# Patient Record
Sex: Male | Born: 1935 | Race: Black or African American | Hispanic: No | Marital: Married | Smoking: Never smoker
Health system: Southern US, Community
[De-identification: ages and names within clinical notes are randomized; demographics above are authoritative.]

## PROBLEM LIST (undated history)

## (undated) DIAGNOSIS — D649 Anemia, unspecified: Secondary | ICD-10-CM

## (undated) DIAGNOSIS — G4733 Obstructive sleep apnea (adult) (pediatric): Secondary | ICD-10-CM

## (undated) DIAGNOSIS — K5730 Diverticulosis of large intestine without perforation or abscess without bleeding: Secondary | ICD-10-CM

## (undated) DIAGNOSIS — M545 Low back pain, unspecified: Secondary | ICD-10-CM

## (undated) DIAGNOSIS — Z9989 Dependence on other enabling machines and devices: Secondary | ICD-10-CM

## (undated) DIAGNOSIS — K219 Gastro-esophageal reflux disease without esophagitis: Secondary | ICD-10-CM

## (undated) DIAGNOSIS — I824Z2 Acute embolism and thrombosis of unspecified deep veins of left distal lower extremity: Secondary | ICD-10-CM

## (undated) DIAGNOSIS — M1990 Unspecified osteoarthritis, unspecified site: Secondary | ICD-10-CM

## (undated) DIAGNOSIS — I82409 Acute embolism and thrombosis of unspecified deep veins of unspecified lower extremity: Secondary | ICD-10-CM

## (undated) DIAGNOSIS — E039 Hypothyroidism, unspecified: Secondary | ICD-10-CM

## (undated) DIAGNOSIS — R918 Other nonspecific abnormal finding of lung field: Secondary | ICD-10-CM

## (undated) DIAGNOSIS — K5909 Other constipation: Secondary | ICD-10-CM

## (undated) DIAGNOSIS — I10 Essential (primary) hypertension: Secondary | ICD-10-CM

## (undated) HISTORY — PX: CATARACT EXTRACTION, BILATERAL: SHX1313

## (undated) HISTORY — DX: Other constipation: K59.09

## (undated) HISTORY — DX: Gastro-esophageal reflux disease without esophagitis: K21.9

## (undated) HISTORY — DX: Obstructive sleep apnea (adult) (pediatric): G47.33

## (undated) HISTORY — DX: Acute embolism and thrombosis of unspecified deep veins of unspecified lower extremity: I82.409

## (undated) HISTORY — DX: Other nonspecific abnormal finding of lung field: R91.8

## (undated) HISTORY — DX: Essential (primary) hypertension: I10

## (undated) HISTORY — DX: Unspecified osteoarthritis, unspecified site: M19.90

## (undated) HISTORY — DX: Hypothyroidism, unspecified: E03.9

## (undated) HISTORY — DX: Acute embolism and thrombosis of unspecified deep veins of left distal lower extremity: I82.4Z2

## (undated) HISTORY — DX: Low back pain, unspecified: M54.50

## (undated) HISTORY — DX: Dependence on other enabling machines and devices: Z99.89

## (undated) HISTORY — DX: Low back pain: M54.5

## (undated) HISTORY — PX: TOTAL KNEE ARTHROPLASTY: SHX125

## (undated) HISTORY — DX: Anemia, unspecified: D64.9

## (undated) HISTORY — DX: Diverticulosis of large intestine without perforation or abscess without bleeding: K57.30

---

## 1997-08-12 ENCOUNTER — Ambulatory Visit: Admission: RE | Admit: 1997-08-12 | Discharge: 1997-08-12 | Payer: Self-pay | Admitting: Otolaryngology

## 2000-07-10 ENCOUNTER — Ambulatory Visit (HOSPITAL_COMMUNITY): Admission: RE | Admit: 2000-07-10 | Discharge: 2000-07-10 | Payer: Self-pay | Admitting: Pulmonary Disease

## 2000-07-10 ENCOUNTER — Encounter: Payer: Self-pay | Admitting: Pulmonary Disease

## 2004-01-21 DIAGNOSIS — R918 Other nonspecific abnormal finding of lung field: Secondary | ICD-10-CM

## 2004-01-21 HISTORY — DX: Other nonspecific abnormal finding of lung field: R91.8

## 2004-09-06 ENCOUNTER — Ambulatory Visit: Payer: Self-pay | Admitting: Pulmonary Disease

## 2004-09-13 ENCOUNTER — Ambulatory Visit: Payer: Self-pay | Admitting: Pulmonary Disease

## 2004-09-13 ENCOUNTER — Ambulatory Visit: Payer: Self-pay | Admitting: Gastroenterology

## 2004-09-16 ENCOUNTER — Ambulatory Visit: Payer: Self-pay | Admitting: Pulmonary Disease

## 2004-10-02 ENCOUNTER — Ambulatory Visit: Payer: Self-pay | Admitting: Gastroenterology

## 2004-10-08 ENCOUNTER — Ambulatory Visit: Payer: Self-pay | Admitting: Pulmonary Disease

## 2004-10-09 ENCOUNTER — Encounter: Admission: RE | Admit: 2004-10-09 | Discharge: 2004-10-09 | Payer: Self-pay | Admitting: Orthopedic Surgery

## 2004-10-10 ENCOUNTER — Inpatient Hospital Stay (HOSPITAL_COMMUNITY): Admission: RE | Admit: 2004-10-10 | Discharge: 2004-10-14 | Payer: Self-pay | Admitting: Orthopedic Surgery

## 2004-12-20 ENCOUNTER — Ambulatory Visit (HOSPITAL_COMMUNITY): Admission: RE | Admit: 2004-12-20 | Discharge: 2004-12-20 | Payer: Self-pay | Admitting: Ophthalmology

## 2005-01-07 ENCOUNTER — Ambulatory Visit: Payer: Self-pay | Admitting: Pulmonary Disease

## 2005-03-10 ENCOUNTER — Ambulatory Visit: Payer: Self-pay | Admitting: Pulmonary Disease

## 2005-03-27 ENCOUNTER — Ambulatory Visit (HOSPITAL_COMMUNITY): Admission: RE | Admit: 2005-03-27 | Discharge: 2005-03-27 | Payer: Self-pay | Admitting: Ophthalmology

## 2005-07-08 ENCOUNTER — Ambulatory Visit: Payer: Self-pay | Admitting: Pulmonary Disease

## 2005-09-18 ENCOUNTER — Ambulatory Visit: Payer: Self-pay | Admitting: Pulmonary Disease

## 2005-09-25 ENCOUNTER — Ambulatory Visit (HOSPITAL_COMMUNITY): Admission: RE | Admit: 2005-09-25 | Discharge: 2005-09-25 | Payer: Self-pay | Admitting: Orthopedic Surgery

## 2005-09-30 ENCOUNTER — Ambulatory Visit: Payer: Self-pay | Admitting: Pulmonary Disease

## 2005-10-03 ENCOUNTER — Ambulatory Visit: Payer: Self-pay | Admitting: Pulmonary Disease

## 2005-10-03 ENCOUNTER — Ambulatory Visit (HOSPITAL_COMMUNITY): Admission: RE | Admit: 2005-10-03 | Discharge: 2005-10-03 | Payer: Self-pay | Admitting: Orthopedic Surgery

## 2005-10-09 ENCOUNTER — Encounter: Admission: RE | Admit: 2005-10-09 | Discharge: 2005-10-09 | Payer: Self-pay | Admitting: Orthopedic Surgery

## 2005-11-19 ENCOUNTER — Inpatient Hospital Stay (HOSPITAL_COMMUNITY): Admission: RE | Admit: 2005-11-19 | Discharge: 2005-11-23 | Payer: Self-pay | Admitting: Orthopedic Surgery

## 2006-01-07 ENCOUNTER — Ambulatory Visit: Payer: Self-pay | Admitting: Pulmonary Disease

## 2006-02-23 ENCOUNTER — Ambulatory Visit (HOSPITAL_COMMUNITY): Admission: RE | Admit: 2006-02-23 | Discharge: 2006-02-23 | Payer: Self-pay | Admitting: Ophthalmology

## 2006-05-13 ENCOUNTER — Ambulatory Visit: Payer: Self-pay | Admitting: Pulmonary Disease

## 2006-05-15 ENCOUNTER — Ambulatory Visit: Payer: Self-pay | Admitting: Pulmonary Disease

## 2006-05-15 LAB — CONVERTED CEMR LAB
ALT: 23 units/L (ref 0–40)
Albumin: 3.5 g/dL (ref 3.5–5.2)
Alkaline Phosphatase: 54 units/L (ref 39–117)
BUN: 16 mg/dL (ref 6–23)
Basophils Absolute: 0 10*3/uL (ref 0.0–0.1)
Basophils Relative: 0.8 % (ref 0.0–1.0)
Bilirubin, Direct: 0.1 mg/dL (ref 0.0–0.3)
Calcium: 8.9 mg/dL (ref 8.4–10.5)
Chloride: 109 meq/L (ref 96–112)
Eosinophils Absolute: 0.1 10*3/uL (ref 0.0–0.6)
GFR calc Af Amer: 84 mL/min
GFR calc non Af Amer: 70 mL/min
HCT: 34.6 % — ABNORMAL LOW (ref 39.0–52.0)
HDL: 44.4 mg/dL (ref >39.0)
Hemoglobin: 11.8 g/dL — ABNORMAL LOW (ref 13.0–17.0)
MCV: 90.7 fL (ref 78.0–100.0)
PSA: 1.19 ng/mL (ref 0.10–4.00)
Platelets: 230 10*3/uL (ref 150–400)
RBC: 3.82 M/uL — ABNORMAL LOW (ref 4.22–5.81)
Total Bilirubin: 1 mg/dL (ref 0.3–1.2)
Triglycerides: 63 mg/dL (ref 0–149)

## 2007-04-26 DIAGNOSIS — F411 Generalized anxiety disorder: Secondary | ICD-10-CM | POA: Insufficient documentation

## 2007-04-26 DIAGNOSIS — K219 Gastro-esophageal reflux disease without esophagitis: Secondary | ICD-10-CM

## 2007-04-26 DIAGNOSIS — Z8672 Personal history of thrombophlebitis: Secondary | ICD-10-CM

## 2007-04-26 DIAGNOSIS — F528 Other sexual dysfunction not due to a substance or known physiological condition: Secondary | ICD-10-CM | POA: Insufficient documentation

## 2007-04-26 DIAGNOSIS — M1990 Unspecified osteoarthritis, unspecified site: Secondary | ICD-10-CM | POA: Insufficient documentation

## 2007-04-26 DIAGNOSIS — R918 Other nonspecific abnormal finding of lung field: Secondary | ICD-10-CM

## 2007-04-26 DIAGNOSIS — I10 Essential (primary) hypertension: Secondary | ICD-10-CM

## 2007-04-26 DIAGNOSIS — M545 Low back pain: Secondary | ICD-10-CM

## 2007-06-04 ENCOUNTER — Ambulatory Visit: Payer: Self-pay | Admitting: Pulmonary Disease

## 2007-06-23 ENCOUNTER — Encounter: Admission: RE | Admit: 2007-06-23 | Discharge: 2007-06-23 | Payer: Self-pay | Admitting: Orthopedic Surgery

## 2007-09-03 ENCOUNTER — Ambulatory Visit (HOSPITAL_COMMUNITY): Admission: RE | Admit: 2007-09-03 | Discharge: 2007-09-03 | Payer: Self-pay | Admitting: Ophthalmology

## 2007-09-10 ENCOUNTER — Ambulatory Visit: Payer: Self-pay | Admitting: Pulmonary Disease

## 2007-09-10 DIAGNOSIS — E7800 Pure hypercholesterolemia, unspecified: Secondary | ICD-10-CM | POA: Insufficient documentation

## 2007-09-10 DIAGNOSIS — K429 Umbilical hernia without obstruction or gangrene: Secondary | ICD-10-CM | POA: Insufficient documentation

## 2007-09-10 DIAGNOSIS — D649 Anemia, unspecified: Secondary | ICD-10-CM

## 2007-09-10 DIAGNOSIS — K5730 Diverticulosis of large intestine without perforation or abscess without bleeding: Secondary | ICD-10-CM | POA: Insufficient documentation

## 2007-10-20 ENCOUNTER — Encounter: Payer: Self-pay | Admitting: Pulmonary Disease

## 2008-02-21 ENCOUNTER — Ambulatory Visit: Payer: Self-pay | Admitting: Pulmonary Disease

## 2008-02-22 ENCOUNTER — Encounter: Payer: Self-pay | Admitting: Adult Health

## 2008-02-23 LAB — CONVERTED CEMR LAB
Hemoglobin, Urine: NEGATIVE
Mucus, UA: NEGATIVE
PSA: 1.62 ng/mL (ref 0.10–4.00)
Urine Glucose: NEGATIVE mg/dL

## 2008-06-15 ENCOUNTER — Ambulatory Visit: Payer: Self-pay | Admitting: Pulmonary Disease

## 2008-06-15 DIAGNOSIS — G4733 Obstructive sleep apnea (adult) (pediatric): Secondary | ICD-10-CM

## 2008-06-15 DIAGNOSIS — Z9989 Dependence on other enabling machines and devices: Secondary | ICD-10-CM

## 2008-06-17 ENCOUNTER — Ambulatory Visit (HOSPITAL_BASED_OUTPATIENT_CLINIC_OR_DEPARTMENT_OTHER): Admission: RE | Admit: 2008-06-17 | Discharge: 2008-06-17 | Payer: Self-pay | Admitting: Pulmonary Disease

## 2008-06-17 ENCOUNTER — Encounter: Payer: Self-pay | Admitting: Pulmonary Disease

## 2008-06-30 ENCOUNTER — Ambulatory Visit: Payer: Self-pay | Admitting: Pulmonary Disease

## 2008-06-30 ENCOUNTER — Telehealth (INDEPENDENT_AMBULATORY_CARE_PROVIDER_SITE_OTHER): Payer: Self-pay | Admitting: *Deleted

## 2008-08-16 ENCOUNTER — Ambulatory Visit: Payer: Self-pay | Admitting: Pulmonary Disease

## 2008-09-07 ENCOUNTER — Encounter: Payer: Self-pay | Admitting: Pulmonary Disease

## 2008-09-21 ENCOUNTER — Ambulatory Visit: Payer: Self-pay | Admitting: Pulmonary Disease

## 2008-10-14 ENCOUNTER — Encounter: Payer: Self-pay | Admitting: Pulmonary Disease

## 2008-11-23 ENCOUNTER — Telehealth (INDEPENDENT_AMBULATORY_CARE_PROVIDER_SITE_OTHER): Payer: Self-pay | Admitting: *Deleted

## 2008-12-06 ENCOUNTER — Ambulatory Visit: Payer: Self-pay | Admitting: Pulmonary Disease

## 2008-12-06 ENCOUNTER — Ambulatory Visit: Payer: Self-pay

## 2008-12-06 ENCOUNTER — Encounter: Payer: Self-pay | Admitting: Pulmonary Disease

## 2008-12-06 DIAGNOSIS — I872 Venous insufficiency (chronic) (peripheral): Secondary | ICD-10-CM | POA: Insufficient documentation

## 2008-12-07 ENCOUNTER — Telehealth: Payer: Self-pay | Admitting: Pulmonary Disease

## 2008-12-08 ENCOUNTER — Telehealth: Payer: Self-pay | Admitting: Pulmonary Disease

## 2009-03-22 ENCOUNTER — Ambulatory Visit: Payer: Self-pay | Admitting: Pulmonary Disease

## 2009-04-23 ENCOUNTER — Encounter: Payer: Self-pay | Admitting: Pulmonary Disease

## 2009-10-15 ENCOUNTER — Encounter: Payer: Self-pay | Admitting: Pulmonary Disease

## 2009-10-15 ENCOUNTER — Telehealth: Payer: Self-pay | Admitting: Pulmonary Disease

## 2009-12-24 ENCOUNTER — Telehealth: Payer: Self-pay | Admitting: Pulmonary Disease

## 2010-01-17 ENCOUNTER — Ambulatory Visit (HOSPITAL_COMMUNITY)
Admission: RE | Admit: 2010-01-17 | Discharge: 2010-01-17 | Payer: Self-pay | Source: Home / Self Care | Attending: Ophthalmology | Admitting: Ophthalmology

## 2010-01-20 HISTORY — PX: COLONOSCOPY: SHX174

## 2010-02-09 ENCOUNTER — Encounter: Payer: Self-pay | Admitting: Orthopedic Surgery

## 2010-02-17 LAB — CONVERTED CEMR LAB
ALT: 26 units/L (ref 0–53)
AST: 29 units/L (ref 0–37)
Albumin: 4 g/dL (ref 3.5–5.2)
Alkaline Phosphatase: 48 units/L (ref 39–117)
BUN: 17 mg/dL (ref 6–23)
Basophils Relative: 0.7 % (ref 0.0–3.0)
CO2: 30 meq/L (ref 19–32)
CO2: 31 meq/L (ref 19–32)
Calcium: 9.2 mg/dL (ref 8.4–10.5)
Calcium: 9.2 mg/dL (ref 8.4–10.5)
Chloride: 105 meq/L (ref 96–112)
Chloride: 106 meq/L (ref 96–112)
Cholesterol: 168 mg/dL (ref 0–200)
Cholesterol: 194 mg/dL (ref 0–200)
Eosinophils Relative: 1.8 % (ref 0.0–5.0)
Glucose, Bld: 90 mg/dL (ref 70–99)
HCT: 38.9 % — ABNORMAL LOW (ref 39.0–52.0)
HDL: 45 mg/dL (ref >39.00)
Hemoglobin: 13.1 g/dL (ref 13.0–17.0)
Hemoglobin: 13.6 g/dL (ref 13.0–17.0)
Ketones, ur: NEGATIVE mg/dL
Lymphocytes Relative: 43.4 % (ref 12.0–46.0)
MCHC: 33.8 g/dL (ref 30.0–36.0)
MCHC: 34.4 g/dL (ref 30.0–36.0)
MCV: 94.7 fL (ref 78.0–100.0)
MCV: 96.1 fL (ref 78.0–100.0)
Monocytes Absolute: 0.7 10*3/uL (ref 0.1–1.0)
Monocytes Absolute: 0.7 10*3/uL (ref 0.1–1.0)
Monocytes Relative: 11.4 % (ref 3.0–12.0)
Monocytes Relative: 12.4 % — ABNORMAL HIGH (ref 3.0–12.0)
Neutro Abs: 2.3 10*3/uL (ref 1.4–7.7)
Neutrophils Relative %: 41.9 % — ABNORMAL LOW (ref 43.0–77.0)
PSA: 1.38 ng/mL (ref 0.10–4.00)
Potassium: 4.1 meq/L (ref 3.5–5.1)
RBC: 4.05 M/uL — ABNORMAL LOW (ref 4.22–5.81)
TSH: 3.79 microintl units/mL (ref 0.35–5.50)
Total Bilirubin: 1 mg/dL (ref 0.3–1.2)
Total CHOL/HDL Ratio: 4
Total Protein: 7.3 g/dL (ref 6.0–8.3)
Total Protein: 7.4 g/dL (ref 6.0–8.3)
Urine Glucose: NEGATIVE mg/dL
VLDL: 10.6 mg/dL (ref 0.0–40.0)
WBC: 5.4 10*3/uL (ref 4.5–10.5)
WBC: 5.9 10*3/uL (ref 4.5–10.5)
pH: 6.5 (ref 5.0–8.0)

## 2010-02-19 NOTE — Progress Notes (Signed)
Summary: LETTER-lmtcb x 1  Phone Note Call from Patient Call back at [phone]   Caller: Patient Call For: NADEL Summary of Call: NEED LETTER WRITTEN THAT HE IS UNABLE TO WORK 10 TO 16 HOURS A DAY STANDING ON HIS FEET Initial call taken by: Gustavus Bryant,  October 15, 2009 12:01 PM  Follow-up for Phone Call        [facility] Tilden Dome  October 15, 2009 1:44 PM  called and spoke with pt.  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  Currently at his job he is able to take a break/rest/sit after every 2 hours of standing. However, pt states his job is changing their rules and now are requiring employees to have to stand at one time anywhere from 10 to 16 hours without a break.  Pt states he has had both knees replaced and this would make it very difficult for him to do his job.  Pt would like SN to type up a letter on letterhead stating that pt needs a break every 2 hours for medical reasons and to work no more than 10 hours at a time.  Pt would like this letter mailed to his home address.  Will forward message to SN to address.  Jinny Blossom Reynolds LPN  October 16, 3530 2:07 PM    Additional Follow-up for Phone Call Additional follow up Details #1::        letter done... SN

## 2010-02-19 NOTE — Letter (Signed)
Summary: External Correspondence  External Correspondence   Imported By: Adin Hector 04/23/2009 16:09:56  _____________________________________________________________________  External Attachment:    Type:   Image     Comment:   External Document

## 2010-02-19 NOTE — Progress Notes (Signed)
Summary: ? why Furosemide denied  Phone Note Call from Patient Call back at [phone]   Caller: Patient Call For: Lenna Gilford Reason for Call: Talk to Nurse Summary of Call: Pt requesting refill on Furosemide 20m, and was advised by [facility] same has been denied. JIran PlanasCMA  December 24, 2009 10:16 AM   Follow-up for Phone Call        pt set an appt with Dr. NLenna Gilfordon 02-13-10 at 11am. refill sent to last until appt. JRandolph BingCMA  December 24, 2009 10:46 AM     Prescriptions: FUROSEMIDE 40 MG TABS (FUROSEMIDE) take 1 tab by mouth once daily in the AM...  #30 x 2   Entered by:   JRandolph BingCMA   Authorized by:   SNoralee SpaceMD   Signed by:   JRandolph BingCMA on 12/24/2009   Method used:   Electronically to        [id number]       [address] [place]  [address]      Ph: [phone]      Fax: [phone]  RxID:   [id number]

## 2010-02-19 NOTE — Letter (Signed)
Summary: [facility]  [address], [place] [address]   Phone: [phone]  Fax: [phone]    10/15/2009  Justin Hester [address] [place], [place]  [address]  To Whom It May Concern,    Mr. Justin Hester is a 77 y/o patient of mine followed for general medical purposes.  He has multiple medical issues including severe Osteoarthritis and he has had bilateral total knee replacements.    It has come to my attention that recent changes at work require him to stand for up to 10+ hours at a stretch, and I feel that this would be impossible for a gentleman with this degree of arthritis.     This letter is a request to allow Justin Hester to have breaks every 2 hours for medical reasons. Furthermore I do not believe that he should work more than a 10 hour day. 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  Sincerely,   Deborra Medina. Lenna Gilford MD

## 2010-02-19 NOTE — Assessment & Plan Note (Signed)
Summary: rov for osa   Copy to:  Self- Referral  Primary Provider/Referring Provider:  Teressa Lower  CC:  Pt is here for a 6 month f/u appt.  Pt states he is wearing his cpap machine every night.  Approx 6 hours per night.  Pt denied any complaints with mask or pressure. Marland Kitchen  History of Present Illness: the pt comes in today for f/u of his osa.  He is wearing cpap compliantly and reports no issues with his mask fit or pressure tolerance.  He feels he is sleeping well, and has an acceptable daytime alertness.  He still needs to work on getting more sleep.  Current Medications (verified): 1)  Adult Aspirin Low Strength 81 Mg  Tbdp (Aspirin) .... Take 1 Tablet By Mouth Once A Day 2)  Amlodipine Besylate 10 Mg Tabs (Amlodipine Besylate) .... Take 1 Tab By Mouth Once Daily.Marland KitchenMarland Kitchen 3)  Losartan Potassium 100 Mg Tabs (Losartan Potassium) .... Take 1 Tab By Mouth Once Daily.Marland KitchenMarland Kitchen 4)  Furosemide 40 Mg Tabs (Furosemide) .... Take 1 Tab By Mouth Once Daily in The Am... 5)  Naproxen 250 Mg Tabs (Naproxen) .... As Needed 6)  Tylenol Extra Strength 500 Mg  Tabs (Acetaminophen) .... As Needed 7)  Multivitamins   Tabs (Multiple Vitamin) .... Take 1 Tablet By Mouth Once A Day  Allergies (verified): 1)  ! Darvocet 2)  ! * Cyclobenzaprine 3)  Percocet (Oxycodone-Acetaminophen)  Review of Systems      See HPI  Vital Signs:  Patient profile:   77 year old male Height:      73 inches Weight:      233.13 pounds BMI:     30.87 O2 Sat:      93 % on Room air Temp:     98.2 degrees F oral Pulse rate:   84 / minute BP sitting:   136 / 72  (left arm) Cuff size:   regular  Vitals Entered By: Matthew Folks LPN (March 22, 3472 2:59 PM)  O2 Flow:  Room air CC: Pt is here for a 6 month f/u appt.  Pt states he is wearing his cpap machine every night.  Approx 6 hours per night.  Pt denied any complaints with mask or pressure.  Comments Medications reviewed with patient Matthew Folks LPN  March 22, 5636 7:56 PM     Physical Exam  General:  ow male in nad Nose:  no skin breakdown or pressure necrosis from cpap mask Neurologic:  alert, not sleepy, moves all 4.   Impression & Recommendations:  Problem # 1:  OBSTRUCTIVE SLEEP APNEA (ICD-327.23) the pt is wearing cpap compliantly and reports no issues with the device.  I have asked him to continue with treatment, and reminded him that he can discontinue if he is able to lose significant weight.  He will work on this.  Medications Added to Medication List This Visit: 1)  Naproxen 250 Mg Tabs (Naproxen) .... As needed  Other Orders: Est. Patient Level II (43329)  Patient Instructions: 1)  work on getting more sleep 2)  continue with cpap 3)  work on weight loss 4)  followup with me in one year.

## 2010-02-21 ENCOUNTER — Telehealth (INDEPENDENT_AMBULATORY_CARE_PROVIDER_SITE_OTHER): Payer: Self-pay | Admitting: *Deleted

## 2010-02-27 NOTE — Progress Notes (Signed)
Summary: pt is having stomach problems  Phone Note Call from Patient   Caller: Patient Call For: nadel Summary of Call: patient phoned stated that he has some questions regarding his health. He is having some stomach problems and would like to speak to the nurse. He knows of a clinic that is available when he is available and they take walk in and he is interested in a referral. Patient can be reached at [phone] Initial call taken by: Ozella Rocks,  February 21, 2010 12:43 PM  Follow-up for Phone Call        Pt states that for the past two weeks he has went from once daily to once a week. He denies any abdominal pain or cramping and says there have been no changes in his diet or with his medication. He is not due to see SN until 03/25/2010 and says he cannot come during our office hours to see someone sooner due to his work schedule. Pls advise on any recs for this patient.Francesca Jewett [facility]  February 21, 2010 2:46 PM  Additional Follow-up for Phone Call Additional follow up Details #1::        per SN----if its constipation that he is having problems with then we rec---miralax1 capful of powder in water once or twice daily and take this regularly---and and senokot s    2 by mouth at bedtime  to see if this works----and it is ok to walk in to the clinic if he needs to be seen before this.  thanks Beaver  February 21, 2010 3:58 PM     Additional Follow-up for Phone Call Additional follow up Details #2::    Spoke with pt and notified of the above recs per SN.  Pt verbalized understanding and denies any questions. Follow-up by: Tilden Dome,  February 21, 2010 4:04 PM

## 2010-03-06 ENCOUNTER — Encounter: Payer: Self-pay | Admitting: Pulmonary Disease

## 2010-03-19 NOTE — Letter (Signed)
Summary: CMN for PAP/[employer]  CMN for PAP/[employer]   Imported By: Phillis Knack 03/13/2010 12:01:10  _____________________________________________________________________  External Attachment:    Type:   Image     Comment:   External Document

## 2010-03-22 ENCOUNTER — Telehealth (INDEPENDENT_AMBULATORY_CARE_PROVIDER_SITE_OTHER): Payer: Self-pay | Admitting: *Deleted

## 2010-03-25 ENCOUNTER — Ambulatory Visit: Payer: Self-pay | Admitting: Pulmonary Disease

## 2010-03-25 ENCOUNTER — Other Ambulatory Visit: Payer: Self-pay | Admitting: Pulmonary Disease

## 2010-03-25 ENCOUNTER — Encounter: Payer: Self-pay | Admitting: Pulmonary Disease

## 2010-03-25 ENCOUNTER — Encounter (INDEPENDENT_AMBULATORY_CARE_PROVIDER_SITE_OTHER): Payer: Self-pay | Admitting: *Deleted

## 2010-03-25 ENCOUNTER — Encounter (INDEPENDENT_AMBULATORY_CARE_PROVIDER_SITE_OTHER): Payer: Medicare Other | Admitting: Pulmonary Disease

## 2010-03-25 ENCOUNTER — Other Ambulatory Visit: Payer: Medicare Other

## 2010-03-25 DIAGNOSIS — Z125 Encounter for screening for malignant neoplasm of prostate: Secondary | ICD-10-CM

## 2010-03-25 DIAGNOSIS — I872 Venous insufficiency (chronic) (peripheral): Secondary | ICD-10-CM

## 2010-03-25 DIAGNOSIS — E7800 Pure hypercholesterolemia, unspecified: Secondary | ICD-10-CM

## 2010-03-25 DIAGNOSIS — K219 Gastro-esophageal reflux disease without esophagitis: Secondary | ICD-10-CM

## 2010-03-25 DIAGNOSIS — K5900 Constipation, unspecified: Secondary | ICD-10-CM

## 2010-03-25 DIAGNOSIS — D649 Anemia, unspecified: Secondary | ICD-10-CM

## 2010-03-25 DIAGNOSIS — K5730 Diverticulosis of large intestine without perforation or abscess without bleeding: Secondary | ICD-10-CM

## 2010-03-25 DIAGNOSIS — K5904 Chronic idiopathic constipation: Secondary | ICD-10-CM | POA: Insufficient documentation

## 2010-03-25 DIAGNOSIS — I10 Essential (primary) hypertension: Secondary | ICD-10-CM

## 2010-03-25 DIAGNOSIS — M1990 Unspecified osteoarthritis, unspecified site: Secondary | ICD-10-CM

## 2010-03-25 DIAGNOSIS — G4733 Obstructive sleep apnea (adult) (pediatric): Secondary | ICD-10-CM

## 2010-03-25 LAB — SEDIMENTATION RATE: Sed Rate: 23 mm/hr — ABNORMAL HIGH (ref 0–22)

## 2010-03-25 LAB — CBC WITH DIFFERENTIAL/PLATELET
Eosinophils Absolute: 0.1 10*3/uL (ref 0.0–0.7)
HCT: 41.1 % (ref 39.0–52.0)
Hemoglobin: 14 g/dL (ref 13.0–17.0)
MCHC: 33.9 g/dL (ref 30.0–36.0)
Monocytes Absolute: 0.6 10*3/uL (ref 0.1–1.0)
Monocytes Relative: 10.1 % (ref 3.0–12.0)
Neutro Abs: 2.9 10*3/uL (ref 1.4–7.7)
Platelets: 251 10*3/uL (ref 150.0–400.0)
RDW: 13.9 % (ref 11.5–14.6)
WBC: 6.2 10*3/uL (ref 4.5–10.5)

## 2010-03-25 LAB — TSH: TSH: 4.22 u[IU]/mL (ref 0.35–5.50)

## 2010-03-25 LAB — HEPATIC FUNCTION PANEL
ALT: 21 U/L (ref 0–53)
AST: 17 U/L (ref 0–37)
Alkaline Phosphatase: 54 U/L (ref 39–117)
Total Protein: 7.3 g/dL (ref 6.0–8.3)

## 2010-03-25 LAB — BASIC METABOLIC PANEL
BUN: 13 mg/dL (ref 6–23)
CO2: 31 mEq/L (ref 19–32)
Calcium: 9.4 mg/dL (ref 8.4–10.5)
Chloride: 100 mEq/L (ref 96–112)
Creatinine, Ser: 1.2 mg/dL (ref 0.4–1.5)
GFR: 72.63 mL/min (ref >60.00)
Sodium: 139 mEq/L (ref 135–145)

## 2010-03-25 LAB — PSA: PSA: 1.91 ng/mL (ref 0.10–4.00)

## 2010-03-27 ENCOUNTER — Ambulatory Visit (INDEPENDENT_AMBULATORY_CARE_PROVIDER_SITE_OTHER)
Admission: RE | Admit: 2010-03-27 | Discharge: 2010-03-27 | Disposition: A | Discharge status: Home / Self Care | Payer: Medicare Other | Source: Ambulatory Visit | Attending: Pulmonary Disease | Admitting: Pulmonary Disease

## 2010-03-27 DIAGNOSIS — K5900 Constipation, unspecified: Secondary | ICD-10-CM

## 2010-03-27 DIAGNOSIS — R109 Unspecified abdominal pain: Secondary | ICD-10-CM

## 2010-03-27 MED ORDER — IOHEXOL 300 MG/ML  SOLN
100.0000 mL | Freq: Once | INTRAMUSCULAR | Status: AC | PRN
  Administered 2010-03-27: 100 mL via INTRAVENOUS

## 2010-03-28 NOTE — Progress Notes (Signed)
Summary: cpap question  Phone Note Call from Patient Call back at [phone]   Caller: Patient Call For: clance Summary of Call: Has appt on 3/5 with [facility] and has a question about his cpap machine in ref to this appt. Initial call taken by: Netta Neat,  March 22, 2010 10:24 AM  Follow-up for Phone Call        spoke with pt and he wanted to know should he bring in his card for a download at his appt on 03-25-10. There is no mention of this in [facility] last OV note and per PT he is not having any issues with his machine or mask at this time.  Informed pt he can bring download card in and we can print off his download.  pt verbalized understanding and will bring card in. Marland KitchenMarland KitchenMarland KitchenMatthew Folks LPN  March 21, 9405 68:08 AM

## 2010-04-01 ENCOUNTER — Telehealth: Payer: Self-pay | Admitting: *Deleted

## 2010-04-02 NOTE — Letter (Signed)
Summary: New Patient letter  [facility]  [address] [place], [place] [address]   Phone: [phone]  Fax: [phone]       03/25/2010 MRN: [id number]  Justin Hester [address] [place],   [address]  [place]  Dear Justin Hester,  Welcome to the Gastroenterology Division at [facility].    You are scheduled to see Dr.  Sharlett Iles on 04-25-10 at 10:00A.M. on the 3rd floor at [facility], [place] [employer].  We ask that you try to arrive at our office 15 minutes prior to your appointment time to allow for check-in.  We would like you to complete the enclosed self-administered evaluation form prior to your visit and bring it with you on the day of your appointment.  We will review it with you.  Also, please bring a complete list of all your medications or, if you prefer, bring the medication bottles and we will list them.  Please bring your insurance card so that we may make a copy of it.  If your insurance requires a referral to see a specialist, please bring your referral form from your primary care physician.  Co-payments are due at the time of your visit and may be paid by cash, check or credit card.     Your office visit will consist of a consult with your physician (includes a physical exam), any laboratory testing he/she may order, scheduling of any necessary diagnostic testing (e.g. x-ray, ultrasound, CT-scan), and scheduling of a procedure (e.g. Endoscopy, Colonoscopy) if required.  Please allow enough time on your schedule to allow for any/all of these possibilities.    If you cannot keep your appointment, please call [phone] to cancel or reschedule prior to your appointment date.  This allows [place] the opportunity to schedule an appointment for another patient in need of care.  If you do not cancel or reschedule by 5 p.m. the business day prior to your appointment date, you will be charged a $50.00 late cancellation/no-show fee.    Thank you  for choosing [facility] for your medical needs.  We appreciate the opportunity to care for you.  Please visit [place] at our website  to learn more about our practice.                     Sincerely,                                                             The Gastroenterology Division

## 2010-04-03 ENCOUNTER — Ambulatory Visit (INDEPENDENT_AMBULATORY_CARE_PROVIDER_SITE_OTHER): Payer: Medicare Other | Admitting: Gastroenterology

## 2010-04-03 ENCOUNTER — Encounter (INDEPENDENT_AMBULATORY_CARE_PROVIDER_SITE_OTHER): Payer: Self-pay | Admitting: *Deleted

## 2010-04-03 ENCOUNTER — Encounter: Payer: Self-pay | Admitting: Pulmonary Disease

## 2010-04-03 ENCOUNTER — Encounter: Payer: Self-pay | Admitting: Gastroenterology

## 2010-04-03 DIAGNOSIS — K5900 Constipation, unspecified: Secondary | ICD-10-CM

## 2010-04-03 DIAGNOSIS — R933 Abnormal findings on diagnostic imaging of other parts of digestive tract: Secondary | ICD-10-CM
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
## 2010-04-09 NOTE — Assessment & Plan Note (Signed)
History of Present Illness Visit Type: new patient  Primary GI MD: Owens Loffler MD Primary Provider: Teressa Lower, MD  Requesting Provider: na Chief Complaint: pancreatic cyst  History of Present Illness:     n a very pleasant 77 year old man who is here with his wife today.  who was found to have a cyst in pancreas, incidentally noted on CT done for bloating, constipation. The bowel changes started about 1-2 months ago.  No new medicine prior to that and no dose changes.  No changes in diet or excercise prior to the change in bowel habits.    He started one scoop two times a day and also two sennekot at bedtime. On this regimine his bowels have improved, but are not normal yet.  Overall weight not really changed.  No fevers or chills.  had colonoscoy in 2006 with DRP,   this found diverticulosis but no polyps.   I reviewed the CAT scan images myself and there is indeed a 1 cm fluid collection near the head of his pancreas there is no pancreatic or biliary duct dilation, no solid masses.   he has never been a big alcohol drinker, he has never had pancreatitis that he is aware of.           Current Medications (verified): 1)  Adult Aspirin Low Strength 81 Mg  Tbdp (Aspirin) .... Take 1 Tablet By Mouth Once A Day 2)  Amlodipine Besylate 10 Mg Tabs (Amlodipine Besylate) .... Take 1 Tab By Mouth Once Daily.Marland KitchenMarland Kitchen 3)  Losartan Potassium-Hctz 100-25 Mg Tabs (Losartan Potassium-Hctz) .... Take One By Mouth Once Daily 4)  Furosemide 40 Mg Tabs (Furosemide) .... Take 1 Tab By Mouth Once Daily in The Am... 5)  Naproxen 250 Mg Tabs (Naproxen) .... As Needed 6)  Tylenol Extra Strength 500 Mg  Tabs (Acetaminophen) .... As Needed 7)  Multivitamins   Tabs (Multiple Vitamin) .... Take 1 Tablet By Mouth Once A Day 8)  Miralax  Powd (Polyethylene Glycol 3350) .... One Capful of The Powder (Or 1 Pack) in Water or Juice Twice Daily.Marland KitchenMarland Kitchen 9)  Senokot S 8.6-50 Mg Tabs (Sennosides-Docusate Sodium) ....  Take 2 Tabs By Mouth At Bedtime...  Allergies (verified): 1)  ! Darvocet 2)  ! 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  Review of Systems       Pertinent positive and negative review of systems were noted in the above HPI and GI specific review of systems.  All other review of systems was otherwise negative.   Vital Signs:  Patient profile:   77 year old male Height:      73 inches Weight:      217 pounds BMI:     28.73 BSA:     2.23 Pulse rate:   68 / minute Pulse rhythm:   regular BP sitting:   134 / 80  (left arm) Cuff size:   regular  Vitals Entered By: Hope Pigeon CMA (April 03, 2010 10:35 AM)  Physical Exam  Additional  Exam:  Constitutional: generally well appearing Psychiatric: alert and oriented times 3 Eyes: extraocular movements intact Mouth: oropharynx moist, no lesions Neck: supple, no lymphadenopathy Cardiovascular: heart regular rate and rythm Lungs: CTA bilaterally Abdomen: soft, non-tender, non-distended, no obvious ascites, no peritoneal signs, normal bowel sounds Extremities: no lower extremity edema bilaterally Skin: no lesions on visible extremities    Impression & Recommendations:  Problem # 1:   incidentally noted pancreatic cyst  this appears quite benign on imaging. none of his symptoms are associated with it. Think it is highly unlikely he has a pancreatic malignancy and this is probably not even a pre- cancerous cystic process.  It might be a biliary cyst. Thank for this he will simply have an MRI of his pancreas in 6 months time. We will set this up.  Problem # 2:   change in bowel habits, constipation  this is his presenting complaint and we will proceed with full colonoscopy at his soonest convenience to investigate.  Patient Instructions: 1)  MRI pancreas in 6 months to re-evaluate the pancreatic cyst noted on recent CT scan. 2)  You will be scheduled to have a colonoscopy. 3)  Continue on the miralax and sennekot. 4)  A copy of this information will be sent to Dr. Lenna Gilford. 5)  The medication list was reviewed and reconciled.  All changed / newly prescribed medications were explained.  A complete medication list was provided to the patient / caregiver.  Appended Document: Orders Update/movi    Clinical Lists Changes  Medications: Added new medication of MOVIPREP 100 GM  SOLR (PEG-KCL-NACL-NASULF-NA ASC-C) As per prep instructions. - Signed Rx of MOVIPREP 100 GM  SOLR (PEG-KCL-NACL-NASULF-NA ASC-C) As per prep instructions.;  #1 x 0;  Signed;  Entered by: Christian Mate CMA (AAMA);  Authorized by: Milus Banister MD;  Method used: Electronically to [id number] [place], [place], [place], [place]  [address], Ph: [phone], Fax: [phone] Orders: Added new Test order of Colonoscopy (Colon) - Signed    Prescriptions: MOVIPREP 100 GM  SOLR (PEG-KCL-NACL-NASULF-NA ASC-C) As per prep instructions.  #1 x 0   Entered by:   Christian Mate CMA ([place])   Authorized by:   Milus Banister MD   Signed by:   Christian Mate CMA ([place]) on 04/03/2010   Method used:   Electronically to        [id number] (retail)       [address] [place], [place]  [address]       Ph: [phone]       Fax: [phone]   RxID:   [id number]

## 2010-04-09 NOTE — Procedures (Signed)
Summary: COLON   Colonoscopy  Procedure date:  10/02/2004  Findings:      Location:  [facility].   Patient Name: Justin Hester, Justin Hester MRN:  Procedure Procedures: Colonoscopy CPT: 87373.  Personnel: Endoscopist: Loralee Pacas. Sharlett Iles, MD.  Exam Location: Exam performed in Outpatient Clinic. Outpatient  Patient Consent: Procedure, Alternatives, Risks and Benefits discussed, consent obtained, from patient. Consent was obtained by the RN.  Indications  Average Risk Screening Routine.  History  Current Medications: Patient is not currently taking Coumadin.  Pre-Exam Physical: Performed Oct 02, 2004. Cardio-pulmonary exam, Rectal exam, Abdominal exam, Extremity exam, Mental status exam WNL.  Exam Exam: Extent of exam reached: Cecum, extent intended: Cecum.  The cecum was identified by appendiceal orifice and IC valve. Patient position: on left side. Duration of exam: 20 minutes. Colon retroflexion performed. Images taken. ASA Classification: I. Tolerance: excellent.  Monitoring: Pulse and BP monitoring, Oximetry used. Supplemental O2 given. at 2 Liters.  Colon Prep Used Golytely for colon prep. Prep results: excellent.  Sedation Meds: Patient assessed and found to be appropriate for moderate (conscious) sedation. Fentanyl 50 mcg. given IV. Versed 5 mg. given IV.  Instrument(s): CF 140L. Serial [id number].  Findings - DIVERTICULOSIS: Ascending Colon to Sigmoid Colon. Not bleeding. ICD9: Diverticulosis, Colon: 562.10.  - NORMAL EXAM: Cecum to Rectum. Not Seen: Polyps. AVM's. Colitis. Tumors. Melanosis. Crohn's. Hemorrhoids.   Assessment  Diagnoses: 562.10: Diverticulosis, Colon.   Events  Unplanned Interventions: No intervention was required.  Plans Medication Plan: Continue current medications.  Patient Education: Patient given standard instructions for: Diverticulosis.  Disposition: After procedure patient sent to recovery. After recovery  patient sent home.  Scheduling/Referral: Follow-Up prn.  This report was created from the original endoscopy report, which was reviewed and signed by the above listed endoscopist.

## 2010-04-09 NOTE — Progress Notes (Signed)
   Phone Note From Other Clinic   Caller: Truman Hayward, Dr Jeannine Kitten Office, EXT [phone] Call For: Abnormal CT scan Reason for Call: Schedule Patient Appt Request: Talk with Nurse Summary of Call: Truman Hayward called to request an earlier appointment with Dr Sharlett Iles. NP to us-has never seen Dr Sharlett Iles. Patient saw Dr Lenna Gilford for constipation on 03/25/10 and CT SCAN on 03/27/10 revealing a  probable Pancreatic Cyst. I expalined to Truman Hayward if patient needs an EUS, Dr [name] is the only one who does them.  Dr [name], you had an opening on 04/03/10, so I booked so as not to loose it. Will you see this patient or do you want a Direct EUS? Thanks.  Initial call taken by: Shella Maxim RN,  April 01, 2010 10:12 AM  Follow-up for Phone Call        new office visit. Follow-up by: Milus Banister MD,  April 01, 2010 10:20 AM  Additional Follow-up for Phone Call Additional follow up Details #1::        Notified Truman Hayward at Dr Jeannine Kitten office it's ok to keep the 04/03/10 appointment with Dr [name]. Truman Hayward will call the patient. Additional Follow-up by: Shella Maxim RN,  April 01, 2010 11:01 AM     Appended Document:  called and spoke with pts wife and she is aware of appt with Dr. [name] on 3-14---she is aware to arrive at 10:30 for papers to fill out and appt is at 10:45.  pt or wife will call back for further questions/concerns

## 2010-04-09 NOTE — Letter (Signed)
Summary: [facility] Instructions  [facility]  [place],  [address]   Phone: [phone]  Fax: [phone]       Justin Hester    1930-11-24    MRN: [id number]        Procedure Day /Date:04/22/10 MON     Arrival Time:230 pm     Procedure Time:330 pm     Location of Procedure:                    X  [facility] (4th Floor)                        [place]   Starting 5 days prior to your procedure 04/17/10 do not eat nuts, seeds, popcorn, corn, beans, peas,  salads, or any raw vegetables.  Do not take any fiber supplements (e.g. Metamucil, Citrucel, and Benefiber).  THE DAY BEFORE YOUR PROCEDURE         DATE: 04/21/10  DAY: SUN  1.  Drink clear liquids the entire day-NO SOLID FOOD  2.  Do not drink anything colored red or purple.  Avoid juices with pulp.  No orange juice.  3.  Drink at least 64 oz. (8 glasses) of fluid/clear liquids during the day to prevent dehydration and help the prep work efficiently.  CLEAR LIQUIDS INCLUDE: Water Jello Ice Popsicles Tea (sugar ok, no milk/cream) Powdered fruit flavored drinks Coffee (sugar ok, no milk/cream) Gatorade Juice: apple, white grape, white cranberry  Lemonade Clear bullion, consomm, broth Carbonated beverages (any kind) Strained chicken noodle soup Hard Candy                             4.  In the morning, mix first dose of MoviPrep solution:    Empty 1 Pouch A and 1 Pouch B into the disposable container    Add lukewarm drinking water to the top line of the container. Mix to dissolve    Refrigerate (mixed solution should be used within 24 hrs)  5.  Begin drinking the prep at 5:00 p.m. The MoviPrep container is divided by 4 marks.   Every 15 minutes drink the solution down to the next mark (approximately 8 oz) until the full liter is complete.   6.  Follow completed prep with 16 oz of clear liquid of your choice (Nothing red or purple).   Continue to drink clear liquids until bedtime.  7.  Before going to bed, mix second dose of MoviPrep solution:    Empty 1 Pouch A and 1 Pouch B into the disposable container    Add lukewarm drinking water to the top line of the container. Mix to dissolve    Refrigerate  THE DAY OF YOUR PROCEDURE      DATE:04/22/10 DAY:MON Beginning at 1030 a.m. (5 hours before procedure):         1. Every 15 minutes, drink the solution down to the next mark (approx 8 oz) until the full liter is complete.  2. Follow completed prep with 16 oz. of clear liquid of your choice.    3. You may drink clear liquids until 130 pm (2 HOURS BEFORE PROCEDURE).   MEDICATION INSTRUCTIONS  Unless otherwise instructed, you should take regular prescription medications with a small sip of water   as early as possible the morning of your procedure.  OTHER INSTRUCTIONS  You will need a responsible adult at least 75 years of age to accompany you and drive you home.   This person must remain in the waiting room during your procedure.  Wear loose fitting clothing that is easily removed.  Leave jewelry and other valuables at home.  However, you may wish to bring a book to read or  an iPod/MP3 player to listen to music as you wait for your procedure to start.  Remove all body piercing jewelry and leave at home.  Total time from sign-in until discharge is approximately 2-3 hours.  You should go home directly after your procedure and rest.  You can resume normal activities the  day after your procedure.  The day of your procedure you should not:   Drive   Make legal decisions   Operate machinery   Drink alcohol   Return to work  You will receive specific instructions about eating, activities and medications before you leave.    The above instructions have been reviewed and explained to me by   _______________________    I fully understand and can verbalize these instructions  _____________________________ Date _________

## 2010-04-15 ENCOUNTER — Encounter: Payer: Self-pay | Admitting: Pulmonary Disease

## 2010-04-16 ENCOUNTER — Encounter: Payer: Self-pay | Admitting: Pulmonary Disease

## 2010-04-16 ENCOUNTER — Ambulatory Visit (INDEPENDENT_AMBULATORY_CARE_PROVIDER_SITE_OTHER): Payer: Medicare Other | Admitting: Pulmonary Disease

## 2010-04-16 VITALS — BP 122/66 | HR 76 | Temp 98.0°F | Ht 73.0 in | Wt 219.4 lb

## 2010-04-16 DIAGNOSIS — G4733 Obstructive sleep apnea (adult) (pediatric): Secondary | ICD-10-CM

## 2010-04-16 NOTE — Patient Instructions (Signed)
Continue with cpap, and let me know if any ongoing issues Continue to work on weight loss.  You are doing great. followup with me in one year.

## 2010-04-16 NOTE — Assessment & Plan Note (Signed)
The pt is doing well with cpap, and feels he is sleeping well with excellent daytime alertness.  This is verified by his wife.  He has lost weight since the last visit, and I have encouraged him to continue.  He will followup with me in one year.

## 2010-04-16 NOTE — Progress Notes (Signed)
  Subjective:    Patient ID: Justin Hester, male    DOB: April 04, 1930, 77 y.o.   MRN: [id number]  HPI The pt comes in today for f/u of his osa.  He is wearing cpap compliantly, and feels he sleeps well with excellent daytime alertness.  He denies any mask or pressure issues.  He has lost over 10 pounds since the last visit.   Review of Systems  Constitutional: Negative for fever, appetite change and unexpected weight change.  HENT: Negative for ear pain, congestion, sore throat, rhinorrhea, sneezing, trouble swallowing, dental problem and postnasal drip.   Eyes: Negative for redness.  Respiratory: Negative for cough, shortness of breath and wheezing.   Cardiovascular: Negative for chest pain, palpitations and leg swelling.  Gastrointestinal: Negative for nausea, abdominal pain and diarrhea.  Genitourinary: Negative for dysuria.  Musculoskeletal: Negative for joint swelling.  Skin: Negative for rash.  Neurological: Negative for syncope and headaches.  Hematological: Does not bruise/bleed easily.  Psychiatric/Behavioral: Negative for dysphoric mood. The patient is not nervous/anxious.        Objective:   Physical Exam Well developed male in nad No skin breakdown or pressure necrosis from cpap mask Regular rate and rhythm Mild edema, no cyanosis  Alert, oriented, moves all 4        Assessment & Plan:

## 2010-04-19 ENCOUNTER — Encounter: Payer: Self-pay | Admitting: Gastroenterology

## 2010-04-22 ENCOUNTER — Ambulatory Visit: Payer: Medicare Other | Admitting: Gastroenterology

## 2010-04-22 ENCOUNTER — Other Ambulatory Visit: Payer: Medicare Other | Admitting: Gastroenterology

## 2010-04-22 ENCOUNTER — Encounter: Payer: Self-pay | Admitting: Gastroenterology

## 2010-04-22 DIAGNOSIS — K5730 Diverticulosis of large intestine without perforation or abscess without bleeding: Secondary | ICD-10-CM

## 2010-04-22 DIAGNOSIS — R198 Other specified symptoms and signs involving the digestive system and abdomen: Secondary | ICD-10-CM

## 2010-04-22 DIAGNOSIS — K5900 Constipation, unspecified: Secondary | ICD-10-CM

## 2010-04-22 NOTE — Patient Instructions (Addendum)
Discharge instructions reviewed  Diverticulosis and high fiber diet information given  Repeat colonoscopy in 10 years   Stop Senekot  Continue Miralax

## 2010-04-23 ENCOUNTER — Telehealth: Payer: Self-pay | Admitting: *Deleted

## 2010-04-23 NOTE — Telephone Encounter (Signed)

## 2010-04-25 ENCOUNTER — Ambulatory Visit: Payer: Medicare Other | Admitting: Gastroenterology

## 2010-05-27 ENCOUNTER — Other Ambulatory Visit: Payer: Self-pay | Admitting: Pulmonary Disease

## 2010-06-04 NOTE — Procedures (Signed)
Justin Hester, Justin Hester NO.:  [id number]   MEDICAL RECORD NO.:  [id number]          PATIENT TYPE:  OUT   LOCATION:  SLEEP CENTER                 FACILITY:  [facility]   PHYSICIAN:  Kathee Delton, MD,FCCPDATE OF BIRTH:  [birth year]/16[birth year]1932   DATE OF STUDY:  06/17/2008                            NOCTURNAL POLYSOMNOGRAM   REFERRING PHYSICIAN:  Kathee Delton, MD,FCCP   LOCATION:  Sleep Lab.   REFERRING PHYSICIAN:  Kathee Delton, MD,FCCP   INDICATION FOR STUDY:  Hypersomnia with sleep apnea.   EPWORTH SCORE:  11.   SLEEP ARCHITECTURE:  The patient had total sleep time of 269 minutes  with no slow wave sleep and only 19 minutes of REM.  Sleep onset latency  was very prolonged at 69 minutes, and REM onset was very prolonged at  257 minutes.  Sleep efficiency was very poor at 64%.   RESPIRATORY DATA:  The patient was found to have 114 apneas and 109  hypopneas, giving him an apnea-hypopnea index of 50 events per hour.  The events occurred in all body positions and there was loud snoring  noted throughout.   OXYGEN DATA:  There was O2 desaturation as low as 80% with the patient's  obstructive events.   CARDIAC DATA:  No clinically significant arrhythmias were seen.   MOVEMENT/PARASOMNIA:  The patient had no significant leg jerks or  abnormal behaviors noted.   IMPRESSION/RECOMMENDATIONS:  Severe obstructive sleep apnea/hypopnea  syndrome with an apnea-hypopnea index of 50 events per hour and oxygen  desaturation as low as 80%.  Treatment for this degree of sleep apnea  should focus primarily on weight loss if applicable as well as CPAP  therapy.     Kathee Delton, MD,FCCP  Diplomate, [place] Board of Sleep  Medicine  Electronically Signed    [facility]/MEDQ  D:  06/30/2008 19:06:23  T:  07/01/2008 03:42:55  Job:  [id number]

## 2010-06-07 NOTE — Op Note (Signed)
NAMEAUDIE, Justin Hester             ACCOUNT NO.:  [id number]   MEDICAL RECORD NO.:  [id number]          PATIENT TYPE:  INP   LOCATION:  2866                         FACILITY:  [facility]   PHYSICIAN:  Newt Minion, MD     DATE OF BIRTH:  [birth year]   DATE OF PROCEDURE:  10/10/2004  DATE OF DISCHARGE:                                 OPERATIVE REPORT   PREOPERATIVE DIAGNOSIS:  Osteoarthritis, left knee.   POSTOPERATIVE DIAGNOSIS:  Osteoarthritis, left knee.   PROCEDURE:  Left total knee arthroplasty with DePuy rotating platform  components, #5 tibia, #5 femur, 10-mm polyethylene  tray, posterior-  stabilized, with a 41-mm patella.   SURGEON.:  Newt Minion, MD   ANESTHESIA:  General.   ESTIMATED BLOOD LOSS:  Minimal.   ANTIBIOTICS:  One gram of Kefzol.   DRAINS:  None.   COMPLICATIONS:  None.   TOURNIQUET TIME:  Sixty minutes at 350 mmHg.   DISPOSITION:  To PACU in stable condition.   INDICATION FOR PROCEDURE:  The patient is a 77 year old gentleman with  osteoarthritis of his left knee.  The patient has failed conservative care.  He has 10 degrees of varus and lacks 10 degrees of full extension, and  presents at this time for total knee arthroplasty.  The risks and benefits  were discussed including infection, neurovascular injury, persistent pain,  DVT, pulmonary embolus, and need for additional surgery.  The patient states  he understands and wishes to proceed at this time.   DESCRIPTION OF PROCEDURE:  The patient was brought to OR room 1 and  underwent a general anesthetic.  After adequate level of anesthesia was  obtained, the patient's left lower extremity was prepped using DuraPrep and  draped into a sterile field.  An Charlie Pitter was used to cover all exposed skin.  A midline incision was made; this was carried down with a medial  parapatellar retinacular incision.  The IM guide hole was started and the  guidewire rod was passed into the femur and this was set to take  11 mm off  the distal femur.  The distal femoral cut was made.  The femur was sized for  a 5 and the chamfer cuts were made for the size 5 femur.  Attention was then  focused on the tibia.  Using the external alignment, this was set to take 10  mm off the least involved lateral plateau.  The tibial plateau cut was made  parallel and perpendicular to the long axis of the tibia.  The knee was then  of placed in extension and this had a good spacer block for 10 mm with both  flexion and extension.  The block cut was then made for the posterior  stabilized on the femur.  The tibia was then sized for a 5 and the size 5  trial was placed and the peg cuts were made for the size 5 tibia.  The trial  size 5 femur and size 5 tibia were then placed.  The knee was placed through  full range of motion and had full extension, stable with varus  and valgus  stress.  The trial components were removed.  The patella was then cut and 12  mm were cut from the patella, allowing [place] to use a 41 size patella.  The peg  cuts were made for the size 41 patella.  The trial components were removed.  The knee was irrigated with pulse lavage.  The peg cuts were drilled for the  femoral component.  The tibial tray was then cemented in place and loose  cement was removed.  The femoral component was cemented in place and loose  cement was removed.  The tibial tray was placed and the knee was kept in  extension.  Prior to placing the components, the knee was irrigated with  pulse lavage.  The patella component was then cemented in place and left  clamped until the cement had hardened.  The knee was again irrigated with  pulse lavage after the components were placed.  After the cement had  hardened, again all surfaces were checked; there were no other areas of  loose cement.  The knee was placed through a full range of motion and there  was no subluxation of the patella.  There was good tracking and good varus  and valgus  stability.  The retinaculum was closed using a #1 Vicryl, subcu  was closed using 2-0 Vicryl and the skin was closed using approximating  staples.  The wound was covered Adaptic, orthopedic sponges, sterile Webril  and a Coban dressing.  The patient was extubated and taken to the PACU in  stable condition.      Newt Minion, MD  Electronically Signed     MVD/MEDQ  D:  10/10/2004  T:  10/11/2004  Job:  [id number]

## 2010-06-07 NOTE — Op Note (Signed)
NAMETREI, Justin Hester NO.:  [id number]   MEDICAL RECORD NO.:  [id number]          PATIENT TYPE:   LOCATION:                               FACILITY:  [facility]   PHYSICIAN:  Garlan Fair., M.D. DATE OF BIRTH:   DATE OF PROCEDURE:  DATE OF DISCHARGE:                                 OPERATIVE REPORT   PREOPERATIVE DIAGNOSIS:  Immature cataract, left eye.   POSTOPERATIVE DIAGNOSIS:  Immature cataract, left eye.   OPERATION PERFORMED:  Kelman phacoemulsification of cataract, left eye, with  intraocular lens implantation.   SURGEON:  Ara Kussmaul, M.D.   ANESTHESIA:  Local using Xylocaine 2% with Marcaine 0.75% and Wydase.   JUSTIFICATION FOR SURGERY:  This is a 77 year old gentleman who complains of  blurring of vision in the left eye.  This interferes with his inability to  drive and to read.  He was evaluated and was found to have a visual acuity  best corrected of 20/40 on the right and 20/60 on the left.  The patient has  bilateral immature cataracts, worse on the left than on the right.  Cataract  extraction with intraocular lens implantation was recommended.  He was  admitted at this time for that purpose.   DESCRIPTION OF THE OPERATION:  Under the influence of IV sedation the Van  Lint akinesia and retrobulbar anesthesia were given.  The patient was  prepped and draped in the usual manner.  The lid speculum was inserted under  the upper and lower lids of the left and a 4-0 silk traction suture passed  through the belly of the superior rectus muscle for traction.  A  conjunctival flap was turned and hemostasis was achieved by using cautery.   An incision was made in the sclera at the limbus and dissected down to clear  cornea using a crescent blade, a sideport incision was made at the 1:30  position using the Superblade and Ocucoat was injected into the eye through  the sideport incision.  The anterior chamber was then entered through the  corneoscleral tunnel incision at the 11:30 position and an anterior  capsulotomy was then performed using a bent 25-gauge needle.  The nucleus  was hydrodissected using Xylocaine.  The KPE handpiece was fashioned in the  eye and the nucleus was emulsified without difficulty.  The residual  cortical material was aspirated.  The posterior capsule was polished using  an olive-tip polisher.  The wound was widened slightly to accommodate a  foldable silicone lens.  This lens was seated into the eye behind the iris  without difficulty.   The anterior chamber was reformed and the pupil was constricted using  Miochol.  The ellipses of the wound were hydrated and tested to make sure  that there was no leak.  After ascertaining there was no leak the  conjunctiva was closed over the wound using thermocautery.  One milliliter  of Celestone and a half milliliter of gentamicin were injected  subconjunctivally.  Maxitrol ophthalmic ointment and Pilopine ointment were  applied along with a patch and Fox shield.   The patient tolerated the  procedure well and was discharged to the post  anesthesia recovery room in satisfactory condition.  He was instructed to  rest today and to take Tylenol every four hours as needed for pain.  He is  to see me in the office tomorrow for further evaluation.   DISCHARGE DIAGNOSIS:  Immature cataract, left eye.      Garlan Fair., M.D.  Electronically Signed     TB/MEDQ  D:  12/20/2004  T:  12/22/2004  Job:  [id number]

## 2010-06-07 NOTE — Assessment & Plan Note (Signed)
[facility]                               PULMONARY OFFICE NOTE   LAREN, WHALING                      MRN:          [id number]  DATE:09/18/2005                            DOB:          12/23/1930    HISTORY OF PRESENT ILLNESS:  The patient is a 77 year old, African-American  male patient of Dr. Jeannine Kitten who has a known history of hypertension who  presents to the office today with elevated blood pressure readings.  The  patient reports he has had some concern that his blood pressure cuff at home  has been reading elevated with blood pressures of 545-613 systolic.  The  patient denies any headache, visual changes, chest pain or palpitations.  The patient does report he has been taking Aleve over the last 1-2 weeks for  arthritic complaints.  The patient reports prior to this week, his systolic  blood pressures at home have been averaging 110-130.   PAST MEDICAL HISTORY:  Reviewed.   CURRENT MEDICATIONS:  Reviewed.   PHYSICAL EXAMINATION:  GENERAL:  The patient is a pleasant male in no acute  distress.  VITAL SIGNS:  Afebrile.  Blood pressure recheck is 160/80.  HEENT:  Unremarkable.  NECK:  Supple without adenopathy or JVD.  LUNGS:  Clear.  CARDIAC:  Regular rate.  ABDOMEN:  Soft and benign.  EXTREMITIES:  Warm without any edema.   ASSESSMENT/PLAN:  Hypertension with elevated blood pressure readings suspect  is side-effect of the anti-inflammatory medication Aleve.  The patient has  been recommended to discontinue over-the-counter nonsteroidal anti-  inflammatories at the present time and to avoid decongestants as well.  The  patient will decrease his sodium intake.  Check blood pressures daily and  bring in a blood pressure log in the next 2 weeks with Dr. Lenna Gilford to review.  Currently, the patient is on Norvasc and Hyzaar and will continue on his  current regimen and decide if they need to be adjusted at next office visit.                           Rexene Edison, NP                                Deborra Medina. Lenna Gilford, MD   TP/MedQ  DD:  09/23/2005  DT:  09/23/2005  Job #:  [id number]

## 2010-06-07 NOTE — Op Note (Signed)
[name], Justin Hester             ACCOUNT NO.:  [id number]   MEDICAL RECORD NO.:  [id number]          PATIENT TYPE:  AMB   LOCATION:  SDS                          FACILITY:  [facility]   PHYSICIAN:  Garlan Fair., M.D.DATE OF BIRTH:  March 15, 1930   DATE OF PROCEDURE:  03/28/3005  DATE OF DISCHARGE:  03/27/2005                                 OPERATIVE REPORT   PREOPERATIVE DIAGNOSIS:  Immature cataract, right eye.   POSTOPERATIVE DIAGNOSIS:  Immature cataract, right eye.   OPERATION PERFORMED:  Kelman phacoemulsification of cataract, right eye.   SURGEON:  Ara Kussmaul, M.D.   ANESTHESIA:  Local using Xylocaine 2% and Marcaine 0.75% and Wydase.   INDICATIONS FOR PROCEDURE:  The patient is a 77 year old gentleman, who  underwent a cataract extraction from the left eye approximately two months  ago.  He has a cataract of the right eye with visual acuity best corrected  at 20/60.  He complains of blurring of vision with difficulty seeing to  read.  Cataract extraction of the right eye was recommended.  He was  admitted at this time for that purpose.   DESCRIPTION OF PROCEDURE:  Under the influence of intravenous sedation and  Van Lint akinesia, retrobulbar anesthesia was given.  The patient was  prepped and draped in the usual manner.  A lid speculum was inserted under  the upper and lower lid of the right eye and a 4-0 silk traction suture was  passed through the belly of the superior rectus muscle for traction.  A  small limbal fornix-based conjunctival flap was turned and hemostasis  achieved using cautery.  An incision was made in the sclera at the limbus.  This incision was dissected down to clear cornea using a crescent blade.  A  side port incision was made at the 1:30 o'clock position.  OcuCoat was  injected into the eye through the side port incision.  The anterior chamber  was entered through the corneoscleral tunnel incision at the 11:30 o'clock  position.  An  anterior capsulotomy was done using a bent 25 gauge needle.  The nucleus was hydrodissected using Xylocaine.  The  [facility]  phacoemulsification handpiece was passed into the eye and the nucleus was  emulsified without difficulty.  The residual cortical material was  aspirated.  The posterior capsule was polished with the olive tip polisher.  The wound was widened slightly to accommodate a foldable intraocular lens.  This lens was seated into the eye behind the iris without difficulty.  The  anterior chamber was reformed and the pupil was constricted using Miochol.  The residual OcuCoat was aspirated frozen section the eye.  The lips of the  wound were hydrated and tested to make sure there was no leak.  After it was  ascertained that there was no leak, the conjunctiva was closed over the  wound using thermal cautery.  21m of Celestone and 0.582mof gentamicin were  injected subconjunctivally.  Maxitrol ophthalmic ointment and Pilopine  ointment were applied along with a patch and Fox shield. The patient  tolerated the procedure well and was discharged to the  post anesthesia  recovery room in  satisfactory condition.  The patient was instructed to rest today, to take  Vicodin every four hours as needed for pain and to see me in the office  tomorrow for further evaluation.   DISCHARGE DIAGNOSIS:  Immature cataract, right eye.      Garlan Fair., M.D.  Electronically Signed     TB/MEDQ  D:  03/28/2005  T:  03/31/2005  Job:  [id number]

## 2010-06-07 NOTE — Op Note (Signed)
Justin Hester, Justin Hester             ACCOUNT NO.:  [id number]   MEDICAL RECORD NO.:  [id number]          PATIENT TYPE:  INP   LOCATION:  5040                         FACILITY:  [facility]   PHYSICIAN:  Newt Minion, MD     DATE OF BIRTH:  04/12/1930   DATE OF PROCEDURE:  11/19/2005  DATE OF DISCHARGE:                                 OPERATIVE REPORT   PREOPERATIVE DIAGNOSIS:  Osteoarthritis right knee.   POSTOPERATIVE DIAGNOSIS:  Osteoarthritis right knee.   PROCEDURE:  Right total knee arthroplasty with DePuy components, #5 femur,  #5 tibia, 12.5 mm poly tray with a 41 mm patella.   SURGEON:  Newt Minion, MD.   ANESTHESIA:  General.   ANTIBIOTICS:  One gram preoperatively and 1 g intraoperatively.   DRAINS:  None.   COMPLICATIONS:  None.   TOURNIQUET TIME:  27 minutes at the thigh at 300 mmHg.   DISPOSITION:  To PACU in stable condition.   INDICATIONS FOR PROCEDURE:  The patient is a 77 year old gentleman with  osteoarthritis of his right knee has failed conservative care.  He is status  post a left total knee arthroplasty and presents at this time for a right  total knee arthroplasty.  The risks and benefits were discussed including  infection, neurovascular injury, persistent pain, DVT, pulmonary embolus,  need for additional surgery.  The patient states he understands and wished  proceed at this time.   DESCRIPTION OF PROCEDURE:  The patient was brought to OR room #5 and  underwent a general anesthetic.  After adequate level of his anesthesia was  obtained, the patient's right lower extremity was prepped using DuraPrep and  draped into a sterile field.  A midline incision was made with the knee  flexed and a medial parapatellar retinacular incision was made and the  patella was everted.  The starting drill hole was made for the femur.  The  IM guide for the femur was placed.  This was set to take 11 mm off the  distal femur.  The distal femoral cut was made.  This was  sized for size 5  and chamfer cuts were made for a size 5 femur.  Attention was then focused  on the tibia.  The external alignment guide was used with neutral varus-  valgus and neutral posterior slope.  This was set to take 10 mm off the  medial tibial plateau.  The plateau cut was made.  This measured a size 5.  The template was placed with a 5 and the keel punch was made for the size 5  tibia.  The box cut was then made for the femur with the box cut guide in  place.  The trial femoral and tibial components were tried with a 10 and a  12.5 mm poly tray.  This had good flexion and extension with full range of  motion with the 12.5 mm poly.  The peg holes were drilled for the femur.  The temporary implants were removed and the patella was then resurfaced with  12 mm taken from the patella and this was  sized for a 41 and the peg cuts  were made for a size 41 patella.  The knee was irrigated with normal saline.  The tourniquet was inflated for total of 27 minutes during the cementing  process.  The posterior aspect of the knee was injected with a total of 60  mL of 0.25% Marcaine plain.  After irrigation, the tibial and then femoral  components were cemented in place.  Loose cement was removed.  The tray was  placed after irrigation with pulse lavage.  The patella was placed and a  clamp was placed.  The knee was kept in extension until all the cement had  hardened.  The knee was irrigated with pulse lavage during this process and  was irrigated with pulse lavage prior to cementing the implants.  The  tourniquet was deflated and hemostasis was obtained.  The patient did have a  lateral tracking of the patella and a lateral release was performed.  This  allowed the patella to track midline.  The retinacular incision was closed  using #1 Vicryl.  Subcu was closed using 2-0 Vicryl.  Skin was closed using  approximating staples.  The wound was covered with Adaptic orthopedic  sponges, ABD  dressing, Webril and a Coban dressing.  The patient was  extubated and taken to PACU in stable condition.      Newt Minion, MD  Electronically Signed     MVD/MEDQ  D:  11/19/2005  T:  11/19/2005  Job:  [id number]

## 2010-06-07 NOTE — Discharge Summary (Signed)
NAMEJOSEALBERTO, Justin Hester             ACCOUNT NO.:  [id number]   MEDICAL RECORD NO.:  [id number]          PATIENT TYPE:  INP   LOCATION:  5040                         FACILITY:  [facility]   PHYSICIAN:  Newt Minion, MD     DATE OF BIRTH:  09-Nov-1930   DATE OF ADMISSION:  11/19/2005  DATE OF DISCHARGE:  11/23/2005                               DISCHARGE SUMMARY   FINAL DIAGNOSIS:  Osteoarthritis right knee.   PROCEDURE:  Right total knee arthroplasty.   Discharged to home in stable condition.  Follow-up in the office in 2  weeks.  Coumadin for DVT prophylaxis for 1 month.   HISTORY OF PRESENT ILLNESS:  The patient is a 77 year old gentleman with  osteoarthritis of his right knee.  He has failed conservative care and  presented at this time for total knee arthroplasty.   HOSPITAL COURSE:  The patient's hospital course was essentially  unremarkable.  He underwent a right total knee arthroplasty and 10/31  with DePuy components rotating platform tibia with a #5 femur, #5 tibia,  12.5 mm poly tray with a 41 mm patella.  The patient received Kefzol for  infection prophylaxis and tourniquet time of 27 minutes.  Postoperatively, the patient progressed well with physical therapy.  He  was able to ambulate independently and was discharged to home in stable  condition on November 23, 2005, with follow-up in the office in 2 weeks.      Newt Minion, MD  Electronically Signed     MVD/MEDQ  D:  12/18/2005  T:  12/18/2005  Job:  [id number]

## 2010-06-07 NOTE — Discharge Summary (Signed)
Justin Hester, Justin Hester             ACCOUNT NO.:  [id number]   MEDICAL RECORD NO.:  [id number]          PATIENT TYPE:  INP   LOCATION:  5005                         FACILITY:  [facility]   PHYSICIAN:  Newt Minion, MD     DATE OF BIRTH:  1930-05-23   DATE OF ADMISSION:  10/10/2004  DATE OF DISCHARGE:  10/14/2004                                 DISCHARGE SUMMARY   DIAGNOSIS:  Osteoarthritis left knee.   PROCEDURE:  Left total knee arthroplasty.   CONDITION ON DISCHARGE:  Discharged to home in stable condition with home  health physical therapy.   HISTORY OF PRESENT ILLNESS:  The patient is a 77 year old gentleman with  osteoarthritis of his left knee. Patient has failed conservative care and  presented at this time for total knee arthroplasty. The patient's hospital  course was essentially unremarkable. He underwent a left total knee  arthroplasty on October 10, 2004 with DePuy components #5 tibia, #5 femur  with 10 mm poly tray with a 41 mm patella. The patient received Kefzol for  infection prophylaxis and tourniquet time was 60 minutes at 350 mmHg.  Postoperatively, the patient received Kefzol for infection prophylaxis and  received Coumadin for DVT prophylaxis. Postoperative day #1, his hemoglobin  was stable at 11.1. He was seen by physical therapy and progressed well with  therapy and was discharged to home in stable condition on October 14, 2004  with home health physical therapy, Coumadin for DVT prophylaxis, Vicodin for  pain. Follow-up in office in two weeks.      Newt Minion, MD  Electronically Signed     MVD/MEDQ  D:  12/10/2004  T:  12/10/2004  Job:  [id number]

## 2010-07-06 ENCOUNTER — Other Ambulatory Visit: Payer: Self-pay | Admitting: Pulmonary Disease

## 2010-07-08 ENCOUNTER — Telehealth: Payer: Self-pay | Admitting: Pulmonary Disease

## 2010-07-08 NOTE — Telephone Encounter (Signed)
Spoke with pt.  He states that he does not need a refill at this time. But, he is asking how he could possibly get all of his meds to expire at the same time so that he can get them filled all together at once. I advised we can not help with this. I advised that he should take his meds as directed and when he runs out, can let his pharmacy know and then we will refill for him. Pt verbalized understanding.

## 2010-08-05 ENCOUNTER — Other Ambulatory Visit: Payer: Self-pay | Admitting: Pulmonary Disease

## 2010-09-11 ENCOUNTER — Other Ambulatory Visit: Payer: Self-pay | Admitting: Pulmonary Disease

## 2010-09-25 ENCOUNTER — Telehealth: Payer: Self-pay

## 2010-09-25 NOTE — Telephone Encounter (Signed)
Pt was due to have a follow up MRI but does not want to proceed at this time, he says he feels fine and will call if he needs [place].

## 2010-09-25 NOTE — Telephone Encounter (Signed)
ok

## 2010-09-25 NOTE — Telephone Encounter (Signed)
Message copied by Barron Alvine on Wed Sep 25, 2010 10:04 AM ------      Message from: Barron Alvine      Created: Wed Apr 03, 2010 11:11 AM       Pt needs MRI 6 months see 04/02/10 note

## 2010-12-11 ENCOUNTER — Other Ambulatory Visit: Payer: Self-pay | Admitting: Pulmonary Disease

## 2011-01-01 ENCOUNTER — Telehealth: Payer: Self-pay | Admitting: Pulmonary Disease

## 2011-01-01 MED ORDER — AMLODIPINE BESYLATE 10 MG PO TABS: 10.0000 mg | ORAL_TABLET | Freq: Every day | ORAL | Status: DC

## 2011-01-01 MED ORDER — FUROSEMIDE 40 MG PO TABS: 40.0000 mg | ORAL_TABLET | Freq: Every day | ORAL | Status: DC

## 2011-01-01 MED ORDER — LOSARTAN POTASSIUM-HCTZ 100-25 MG PO TABS: 1.0000 | ORAL_TABLET | Freq: Every day | ORAL | Status: DC

## 2011-01-01 NOTE — Telephone Encounter (Signed)
Rx refills were sent to pharm. Spoke with pt and notified that this is done and needs to keep appt for refills. Pt verbalized understanding.

## 2011-02-14 DIAGNOSIS — Z961 Presence of intraocular lens: Secondary | ICD-10-CM | POA: Diagnosis not present

## 2011-02-14 DIAGNOSIS — H04209 Unspecified epiphora, unspecified lacrimal gland: Secondary | ICD-10-CM | POA: Diagnosis not present

## 2011-02-14 DIAGNOSIS — H02009 Unspecified entropion of unspecified eye, unspecified eyelid: Secondary | ICD-10-CM | POA: Diagnosis not present

## 2011-02-26 ENCOUNTER — Ambulatory Visit: Payer: Medicare Other | Admitting: Pulmonary Disease

## 2011-03-27 ENCOUNTER — Ambulatory Visit (INDEPENDENT_AMBULATORY_CARE_PROVIDER_SITE_OTHER)
Admission: RE | Admit: 2011-03-27 | Discharge: 2011-03-27 | Disposition: A | Discharge status: Home / Self Care | Payer: Medicare Other | Source: Ambulatory Visit | Attending: Pulmonary Disease | Admitting: Pulmonary Disease

## 2011-03-27 ENCOUNTER — Ambulatory Visit (INDEPENDENT_AMBULATORY_CARE_PROVIDER_SITE_OTHER): Payer: Medicare Other | Admitting: Pulmonary Disease

## 2011-03-27 ENCOUNTER — Encounter: Payer: Self-pay | Admitting: Pulmonary Disease

## 2011-03-27 ENCOUNTER — Other Ambulatory Visit (INDEPENDENT_AMBULATORY_CARE_PROVIDER_SITE_OTHER): Payer: Medicare Other

## 2011-03-27 DIAGNOSIS — I10 Essential (primary) hypertension: Secondary | ICD-10-CM

## 2011-03-27 DIAGNOSIS — G4733 Obstructive sleep apnea (adult) (pediatric): Secondary | ICD-10-CM

## 2011-03-27 DIAGNOSIS — F419 Anxiety disorder, unspecified: Secondary | ICD-10-CM

## 2011-03-27 DIAGNOSIS — J984 Other disorders of lung: Secondary | ICD-10-CM

## 2011-03-27 DIAGNOSIS — D649 Anemia, unspecified: Secondary | ICD-10-CM | POA: Diagnosis not present

## 2011-03-27 DIAGNOSIS — I872 Venous insufficiency (chronic) (peripheral): Secondary | ICD-10-CM

## 2011-03-27 DIAGNOSIS — F411 Generalized anxiety disorder: Secondary | ICD-10-CM

## 2011-03-27 DIAGNOSIS — K219 Gastro-esophageal reflux disease without esophagitis: Secondary | ICD-10-CM

## 2011-03-27 DIAGNOSIS — M545 Low back pain: Secondary | ICD-10-CM

## 2011-03-27 DIAGNOSIS — E7800 Pure hypercholesterolemia, unspecified: Secondary | ICD-10-CM

## 2011-03-27 DIAGNOSIS — N139 Obstructive and reflux uropathy, unspecified: Secondary | ICD-10-CM | POA: Diagnosis not present

## 2011-03-27 DIAGNOSIS — M1990 Unspecified osteoarthritis, unspecified site: Secondary | ICD-10-CM

## 2011-03-27 DIAGNOSIS — K5730 Diverticulosis of large intestine without perforation or abscess without bleeding: Secondary | ICD-10-CM

## 2011-03-27 DIAGNOSIS — K5900 Constipation, unspecified: Secondary | ICD-10-CM

## 2011-03-27 LAB — LIPID PANEL
LDL Cholesterol: 120 mg/dL — ABNORMAL HIGH (ref 0–99)
Total CHOL/HDL Ratio: 3

## 2011-03-27 LAB — HEPATIC FUNCTION PANEL
ALT: 22 U/L (ref 0–53)
AST: 23 U/L (ref 0–37)
Alkaline Phosphatase: 52 U/L (ref 39–117)
Bilirubin, Direct: 0.2 mg/dL (ref 0.0–0.3)
Total Bilirubin: 0.8 mg/dL (ref 0.3–1.2)

## 2011-03-27 LAB — CBC WITH DIFFERENTIAL/PLATELET
Basophils Absolute: 0 10*3/uL (ref 0.0–0.1)
Eosinophils Absolute: 0 10*3/uL (ref 0.0–0.7)
Hemoglobin: 14 g/dL (ref 13.0–17.0)
Lymphocytes Relative: 42.1 % (ref 12.0–46.0)
MCHC: 33.4 g/dL (ref 30.0–36.0)
Neutro Abs: 2.3 10*3/uL (ref 1.4–7.7)
RDW: 13.5 % (ref 11.5–14.6)

## 2011-03-27 LAB — BASIC METABOLIC PANEL
CO2: 31 mEq/L (ref 19–32)
Chloride: 100 mEq/L (ref 96–112)
Creatinine, Ser: 1.1 mg/dL (ref 0.4–1.5)
Potassium: 4.3 mEq/L (ref 3.5–5.1)
Sodium: 139 mEq/L (ref 135–145)

## 2011-03-27 MED ORDER — AMLODIPINE BESYLATE 10 MG PO TABS: 10.0000 mg | ORAL_TABLET | Freq: Every day | ORAL | Status: DC

## 2011-03-27 MED ORDER — FUROSEMIDE 40 MG PO TABS: 40.0000 mg | ORAL_TABLET | Freq: Every day | ORAL | Status: DC

## 2011-03-27 MED ORDER — LOSARTAN POTASSIUM-HCTZ 100-25 MG PO TABS: 1.0000 | ORAL_TABLET | Freq: Every day | ORAL | Status: DC
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
## 2011-03-27 NOTE — Patient Instructions (Signed)
Today we updated your med list in our EPIC system...    Continue your current medications the same...  Today we did your follow up CXR & fasting blood work...    Please call the PHONE TREE in a few days for your results...    Dial [id number] & when prompted enter your patient number followed by the # symbol...    Your patient number is:  [phone]  Call for any questions...  Let's continue our yearly follow up visits, but call anytime if needed for problems.Marland KitchenMarland Kitchen

## 2011-04-03 ENCOUNTER — Other Ambulatory Visit: Payer: Self-pay | Admitting: *Deleted

## 2011-04-16 ENCOUNTER — Ambulatory Visit: Payer: Medicare Other | Admitting: Pulmonary Disease

## 2011-05-07 ENCOUNTER — Ambulatory Visit (INDEPENDENT_AMBULATORY_CARE_PROVIDER_SITE_OTHER): Payer: Medicare Other | Admitting: Pulmonary Disease

## 2011-05-07 ENCOUNTER — Encounter: Payer: Self-pay | Admitting: Pulmonary Disease

## 2011-05-07 ENCOUNTER — Telehealth: Payer: Self-pay

## 2011-05-07 VITALS — BP 134/76 | HR 69 | Temp 97.9°F | Ht 73.0 in | Wt 224.0 lb

## 2011-05-07 DIAGNOSIS — G4733 Obstructive sleep apnea (adult) (pediatric): Secondary | ICD-10-CM | POA: Diagnosis not present

## 2011-05-07 NOTE — Assessment & Plan Note (Signed)
The patient is doing fairly well with CPAP, and is wearing the device compliantly by his recent download.  His wife does note occasional breakthrough snoring, and his AHI on his download showed 9.7 events per hour.  He feels that he is sleeping fairly well with adequate daytime alertness, but I think it is worthwhile optimizing his pressure again since he is having breakthrough events.  I have asked him to keep up with his mask changes and supplies, and to work aggressively on weight loss.

## 2011-05-07 NOTE — Progress Notes (Signed)
  Subjective:    Patient ID: Justin Hester, male    DOB: 12/15/1930, 78 y.o.   MRN: [id number]  HPI The patient comes in today for followup of his known obstructive sleep apnea.  He is wearing CPAP compliantly by his recent tablet, and feels that he sleeps well with no significant alertness issues during the day.  His download does show some breakthrough events, and his wife has noticed breakthrough snoring at times.  His weight is mildly up since the last visit.   Review of Systems  Constitutional: Negative for fever and unexpected weight change.  HENT: Negative for ear pain, nosebleeds, congestion, sore throat, rhinorrhea, sneezing, trouble swallowing, dental problem, postnasal drip and sinus pressure.   Eyes: Negative for redness and itching.  Respiratory: Negative for cough, chest tightness, shortness of breath and wheezing.   Cardiovascular: Positive for leg swelling. Negative for palpitations.  Gastrointestinal: Negative for nausea and vomiting.  Genitourinary: Negative for dysuria.  Musculoskeletal: Positive for joint swelling.  Skin: Negative for rash.  Neurological: Negative for headaches.  Hematological: Does not bruise/bleed easily.  Psychiatric/Behavioral: Negative for dysphoric mood. The patient is not nervous/anxious.        Objective:   Physical Exam Overweight male in no acute distress Nose without purulence or discharge noted No skin breakdown or pressure necrosis from the CPAP mask No significant edema or cyanosis Alert and oriented, does not appear to be sleepy, moves all 4 extremities.       Assessment & Plan:

## 2011-05-07 NOTE — Telephone Encounter (Signed)
Called and spoke with pt and he is aware that nothing was needed from this office. He stated that the person that called him did not leave a message so it may have been a mistake.  Nothing needed from the pt.

## 2011-05-07 NOTE — Patient Instructions (Signed)
Work on [employer] loss Will check your pressure again on auto for the next few weeks.  Will let you know the results. If doing well, followup with me in one year.

## 2011-05-07 NOTE — Telephone Encounter (Signed)
Called spoke with patient who stated that someone called his home while he was at his appt this morning with [facility].  No message was left.  Pt stated that he did receive his labs and cxr results via phone tree from last ov with SN.  [name], did you happen to call this patient?  Thanks.

## 2011-05-19 ENCOUNTER — Telehealth: Payer: Self-pay | Admitting: Pulmonary Disease

## 2011-05-19 DIAGNOSIS — H9190 Unspecified hearing loss, unspecified ear: Secondary | ICD-10-CM

## 2011-05-19 NOTE — Telephone Encounter (Signed)
I spoke with spouse and is aware of SN recs. Referral has been placed and nothing further was needed

## 2011-05-19 NOTE — Telephone Encounter (Signed)
lmomtcb at both #'s listed

## 2011-05-19 NOTE — Telephone Encounter (Signed)
I spoke with spouse and she states pt has been having difficulty hearing x 2-3 weeks. She is wanting pt to be referred to an audiologists. Please advise sn thanks

## 2011-05-19 NOTE — Telephone Encounter (Signed)
Per SN---recs for pt to have ENT eval---Dr. Redmond Baseman and their audiologist  [facility]  thanks

## 2011-05-20 ENCOUNTER — Telehealth: Payer: Self-pay | Admitting: Pulmonary Disease

## 2011-05-20 NOTE — Telephone Encounter (Signed)
Spoke to pt about his appt wife aware Justin Hester

## 2011-05-22 DIAGNOSIS — H04549 Stenosis of unspecified lacrimal canaliculi: Secondary | ICD-10-CM | POA: Diagnosis not present

## 2011-05-22 DIAGNOSIS — Z961 Presence of intraocular lens: Secondary | ICD-10-CM | POA: Diagnosis not present

## 2011-05-22 DIAGNOSIS — H04209 Unspecified epiphora, unspecified lacrimal gland: Secondary | ICD-10-CM | POA: Diagnosis not present

## 2011-05-22 DIAGNOSIS — Z9849 Cataract extraction status, unspecified eye: Secondary | ICD-10-CM | POA: Diagnosis not present

## 2011-05-22 DIAGNOSIS — H0289 Other specified disorders of eyelid: Secondary | ICD-10-CM | POA: Diagnosis not present

## 2011-05-22 DIAGNOSIS — H04569 Stenosis of unspecified lacrimal punctum: Secondary | ICD-10-CM | POA: Diagnosis not present

## 2011-06-11 DIAGNOSIS — H905 Unspecified sensorineural hearing loss: Secondary | ICD-10-CM | POA: Diagnosis not present

## 2011-07-05 ENCOUNTER — Other Ambulatory Visit: Payer: Self-pay | Admitting: Pulmonary Disease

## 2011-07-05 DIAGNOSIS — G4733 Obstructive sleep apnea (adult) (pediatric): Secondary | ICD-10-CM

## 2011-08-04 ENCOUNTER — Telehealth: Payer: Self-pay | Admitting: Pulmonary Disease

## 2011-08-04 ENCOUNTER — Other Ambulatory Visit: Payer: Self-pay | Admitting: Pulmonary Disease

## 2011-08-04 NOTE — Telephone Encounter (Signed)
These refills were in Justin Hester's in basket-they have been sent and wife is aware.

## 2011-08-21 DIAGNOSIS — Z87892 Personal history of anaphylaxis: Secondary | ICD-10-CM | POA: Diagnosis not present

## 2011-08-21 DIAGNOSIS — Z961 Presence of intraocular lens: Secondary | ICD-10-CM | POA: Diagnosis not present

## 2011-08-21 DIAGNOSIS — H04209 Unspecified epiphora, unspecified lacrimal gland: Secondary | ICD-10-CM | POA: Diagnosis not present

## 2011-08-21 DIAGNOSIS — H04569 Stenosis of unspecified lacrimal punctum: Secondary | ICD-10-CM | POA: Diagnosis not present

## 2011-08-21 DIAGNOSIS — H02109 Unspecified ectropion of unspecified eye, unspecified eyelid: Secondary | ICD-10-CM | POA: Diagnosis not present

## 2011-08-21 DIAGNOSIS — Z9849 Cataract extraction status, unspecified eye: Secondary | ICD-10-CM | POA: Diagnosis not present

## 2011-08-21 DIAGNOSIS — I10 Essential (primary) hypertension: Secondary | ICD-10-CM | POA: Diagnosis not present

## 2011-08-26 DIAGNOSIS — L219 Seborrheic dermatitis, unspecified: Secondary | ICD-10-CM | POA: Diagnosis not present

## 2011-08-29 DIAGNOSIS — I10 Essential (primary) hypertension: Secondary | ICD-10-CM | POA: Diagnosis not present

## 2011-08-29 DIAGNOSIS — H04569 Stenosis of unspecified lacrimal punctum: Secondary | ICD-10-CM | POA: Diagnosis not present

## 2011-08-29 DIAGNOSIS — H02109 Unspecified ectropion of unspecified eye, unspecified eyelid: Secondary | ICD-10-CM | POA: Diagnosis not present

## 2011-08-29 DIAGNOSIS — H02139 Senile ectropion of unspecified eye, unspecified eyelid: Secondary | ICD-10-CM | POA: Diagnosis not present

## 2011-08-29 DIAGNOSIS — H04209 Unspecified epiphora, unspecified lacrimal gland: Secondary | ICD-10-CM | POA: Diagnosis not present

## 2011-09-04 ENCOUNTER — Telehealth: Payer: Self-pay | Admitting: Pulmonary Disease

## 2011-09-04 DIAGNOSIS — H02139 Senile ectropion of unspecified eye, unspecified eyelid: Secondary | ICD-10-CM | POA: Diagnosis not present

## 2011-09-04 DIAGNOSIS — H04209 Unspecified epiphora, unspecified lacrimal gland: Secondary | ICD-10-CM | POA: Diagnosis not present

## 2011-09-04 DIAGNOSIS — I10 Essential (primary) hypertension: Secondary | ICD-10-CM | POA: Diagnosis not present

## 2011-09-05 NOTE — Telephone Encounter (Signed)
Called and spoke with pts wife and she is aware that handicap placard is up front and ready to be picked up. Nothing further is needed.

## 2011-09-15 DIAGNOSIS — H5710 Ocular pain, unspecified eye: Secondary | ICD-10-CM | POA: Diagnosis not present

## 2011-09-15 DIAGNOSIS — H278 Other specified disorders of lens: Secondary | ICD-10-CM | POA: Diagnosis not present

## 2011-09-15 DIAGNOSIS — H20019 Primary iridocyclitis, unspecified eye: Secondary | ICD-10-CM | POA: Diagnosis not present

## 2011-09-15 DIAGNOSIS — H02109 Unspecified ectropion of unspecified eye, unspecified eyelid: Secondary | ICD-10-CM | POA: Diagnosis not present

## 2011-09-17 DIAGNOSIS — H5710 Ocular pain, unspecified eye: Secondary | ICD-10-CM | POA: Diagnosis not present

## 2011-09-24 DIAGNOSIS — H20019 Primary iridocyclitis, unspecified eye: Secondary | ICD-10-CM | POA: Diagnosis not present

## 2011-09-29 DIAGNOSIS — H20019 Primary iridocyclitis, unspecified eye: Secondary | ICD-10-CM | POA: Diagnosis not present

## 2011-10-13 DIAGNOSIS — H20019 Primary iridocyclitis, unspecified eye: Secondary | ICD-10-CM | POA: Diagnosis not present

## 2011-10-13 DIAGNOSIS — H20029 Recurrent acute iridocyclitis, unspecified eye: Secondary | ICD-10-CM | POA: Diagnosis not present

## 2011-10-21 DIAGNOSIS — H2129 Other iris atrophy: Secondary | ICD-10-CM | POA: Diagnosis not present

## 2011-10-21 DIAGNOSIS — Z9889 Other specified postprocedural states: Secondary | ICD-10-CM | POA: Diagnosis not present

## 2011-10-21 DIAGNOSIS — Z4889 Encounter for other specified surgical aftercare: Secondary | ICD-10-CM | POA: Diagnosis not present

## 2011-10-21 DIAGNOSIS — H0289 Other specified disorders of eyelid: Secondary | ICD-10-CM | POA: Diagnosis not present

## 2011-11-04 ENCOUNTER — Ambulatory Visit (INDEPENDENT_AMBULATORY_CARE_PROVIDER_SITE_OTHER): Payer: Medicare Other

## 2011-11-04 DIAGNOSIS — Z23 Encounter for immunization: Secondary | ICD-10-CM

## 2011-11-13 DIAGNOSIS — H20019 Primary iridocyclitis, unspecified eye: Secondary | ICD-10-CM | POA: Diagnosis not present

## 2011-11-13 DIAGNOSIS — H278 Other specified disorders of lens: Secondary | ICD-10-CM | POA: Diagnosis not present

## 2011-11-19 DIAGNOSIS — T8529XA Other mechanical complication of intraocular lens, initial encounter: Secondary | ICD-10-CM | POA: Diagnosis not present

## 2011-11-19 DIAGNOSIS — H21549 Posterior synechiae (iris), unspecified eye: Secondary | ICD-10-CM | POA: Diagnosis not present

## 2011-11-19 DIAGNOSIS — Z961 Presence of intraocular lens: Secondary | ICD-10-CM | POA: Diagnosis not present

## 2011-12-03 DIAGNOSIS — H20029 Recurrent acute iridocyclitis, unspecified eye: Secondary | ICD-10-CM | POA: Diagnosis not present

## 2011-12-03 DIAGNOSIS — H20019 Primary iridocyclitis, unspecified eye: Secondary | ICD-10-CM | POA: Diagnosis not present

## 2011-12-03 DIAGNOSIS — H278 Other specified disorders of lens: Secondary | ICD-10-CM | POA: Diagnosis not present

## 2011-12-04 ENCOUNTER — Other Ambulatory Visit: Payer: Self-pay | Admitting: *Deleted

## 2011-12-04 MED ORDER — FUROSEMIDE 40 MG PO TABS: 40.0000 mg | ORAL_TABLET | Freq: Every day | ORAL | Status: DC

## 2011-12-04 MED ORDER — AMLODIPINE BESYLATE 10 MG PO TABS: 10.0000 mg | ORAL_TABLET | Freq: Every day | ORAL | Status: DC

## 2011-12-10 DIAGNOSIS — H532 Diplopia: Secondary | ICD-10-CM | POA: Diagnosis not present

## 2011-12-10 DIAGNOSIS — H21549 Posterior synechiae (iris), unspecified eye: Secondary | ICD-10-CM | POA: Diagnosis not present

## 2011-12-10 DIAGNOSIS — T8529XA Other mechanical complication of intraocular lens, initial encounter: Secondary | ICD-10-CM | POA: Diagnosis not present

## 2011-12-10 DIAGNOSIS — H538 Other visual disturbances: Secondary | ICD-10-CM | POA: Diagnosis not present

## 2011-12-10 DIAGNOSIS — Y839 Surgical procedure, unspecified as the cause of abnormal reaction of the patient, or of later complication, without mention of misadventure at the time of the procedure: Secondary | ICD-10-CM | POA: Diagnosis not present

## 2011-12-10 DIAGNOSIS — H21509 Unspecified adhesions of iris and ciliary body, unspecified eye: Secondary | ICD-10-CM | POA: Diagnosis not present

## 2012-01-19 DIAGNOSIS — H21549 Posterior synechiae (iris), unspecified eye: Secondary | ICD-10-CM | POA: Diagnosis not present

## 2012-01-19 DIAGNOSIS — H35359 Cystoid macular degeneration, unspecified eye: Secondary | ICD-10-CM | POA: Diagnosis not present

## 2012-01-19 DIAGNOSIS — T8529XA Other mechanical complication of intraocular lens, initial encounter: Secondary | ICD-10-CM | POA: Diagnosis not present

## 2012-01-19 DIAGNOSIS — Z961 Presence of intraocular lens: Secondary | ICD-10-CM | POA: Diagnosis not present

## 2012-02-16 DIAGNOSIS — H278 Other specified disorders of lens: Secondary | ICD-10-CM | POA: Diagnosis not present

## 2012-02-16 DIAGNOSIS — Z961 Presence of intraocular lens: Secondary | ICD-10-CM | POA: Diagnosis not present

## 2012-03-11 DIAGNOSIS — IMO0002 Reserved for concepts with insufficient information to code with codable children: Secondary | ICD-10-CM | POA: Diagnosis not present

## 2012-03-11 DIAGNOSIS — M1710 Unilateral primary osteoarthritis, unspecified knee: Secondary | ICD-10-CM | POA: Diagnosis not present

## 2012-03-11 DIAGNOSIS — M25579 Pain in unspecified ankle and joints of unspecified foot: Secondary | ICD-10-CM | POA: Diagnosis not present

## 2012-03-18 ENCOUNTER — Telehealth: Payer: Self-pay | Admitting: Pulmonary Disease

## 2012-03-18 DIAGNOSIS — E7800 Pure hypercholesterolemia, unspecified: Secondary | ICD-10-CM

## 2012-03-18 NOTE — Telephone Encounter (Signed)
Pt's wife called back & would like to know about getting the status of pt's 9:00 appt w/ SN.  Requests to be called back today with an update prior to [place] leaving.  Satira Anis

## 2012-03-18 NOTE — Telephone Encounter (Signed)
The pt has been scheduled for April 8th at 4pm. Pt will come in the week prior to have labs done if needed. Pls advise,

## 2012-03-18 NOTE — Telephone Encounter (Signed)
[name], please advise where we can put the pt Looks like they would like a 9 am appt Thanks

## 2012-03-18 NOTE — Telephone Encounter (Signed)
[name], pls advise if there is any way to work pt in before 03/26/12 and at [place].

## 2012-03-24 NOTE — Telephone Encounter (Signed)
I spoke with Justin Hester and she states we need to order labs for the pt. Order Lipid, bmet, hepatic panel, cbc, tsh, and psa. Labs order. Pt is aware. [place] Bing, CMA

## 2012-03-26 ENCOUNTER — Ambulatory Visit: Payer: Medicare Other | Admitting: Pulmonary Disease

## 2012-04-20 ENCOUNTER — Other Ambulatory Visit (INDEPENDENT_AMBULATORY_CARE_PROVIDER_SITE_OTHER): Payer: Medicare Other

## 2012-04-20 DIAGNOSIS — I10 Essential (primary) hypertension: Secondary | ICD-10-CM

## 2012-04-20 DIAGNOSIS — F411 Generalized anxiety disorder: Secondary | ICD-10-CM

## 2012-04-20 DIAGNOSIS — N320 Bladder-neck obstruction: Secondary | ICD-10-CM | POA: Diagnosis not present

## 2012-04-20 DIAGNOSIS — D649 Anemia, unspecified: Secondary | ICD-10-CM

## 2012-04-20 DIAGNOSIS — F419 Anxiety disorder, unspecified: Secondary | ICD-10-CM

## 2012-04-20 DIAGNOSIS — E7800 Pure hypercholesterolemia, unspecified: Secondary | ICD-10-CM

## 2012-04-20 LAB — HEPATIC FUNCTION PANEL
AST: 23 U/L (ref 0–37)
Albumin: 3.7 g/dL (ref 3.5–5.2)
Alkaline Phosphatase: 52 U/L (ref 39–117)
Total Protein: 6.9 g/dL (ref 6.0–8.3)

## 2012-04-20 LAB — CBC
MCV: 93.2 fl (ref 78.0–100.0)
RBC: 4.42 Mil/uL (ref 4.22–5.81)
RDW: 14.2 % (ref 11.5–14.6)
WBC: 6.3 10*3/uL (ref 4.5–10.5)

## 2012-04-20 LAB — LIPID PANEL
Cholesterol: 172 mg/dL (ref 0–200)
HDL: 45.9 mg/dL (ref >39.00)
Triglycerides: 61 mg/dL (ref 0.0–149.0)

## 2012-04-20 LAB — TSH: TSH: 7.72 u[IU]/mL — ABNORMAL HIGH (ref 0.35–5.50)

## 2012-04-20 LAB — BASIC METABOLIC PANEL
Calcium: 9 mg/dL (ref 8.4–10.5)
Creatinine, Ser: 1.2 mg/dL (ref 0.4–1.5)

## 2012-04-20 LAB — PSA: PSA: 1.85 ng/mL (ref 0.10–4.00)

## 2012-04-27 ENCOUNTER — Encounter: Payer: Self-pay | Admitting: Pulmonary Disease

## 2012-04-27 ENCOUNTER — Ambulatory Visit (INDEPENDENT_AMBULATORY_CARE_PROVIDER_SITE_OTHER): Payer: Medicare Other | Admitting: Pulmonary Disease

## 2012-04-27 VITALS — BP 128/62 | HR 75 | Temp 97.7°F | Ht 73.0 in | Wt 219.6 lb

## 2012-04-27 DIAGNOSIS — E7800 Pure hypercholesterolemia, unspecified: Secondary | ICD-10-CM | POA: Diagnosis not present

## 2012-04-27 DIAGNOSIS — K219 Gastro-esophageal reflux disease without esophagitis: Secondary | ICD-10-CM

## 2012-04-27 DIAGNOSIS — E039 Hypothyroidism, unspecified: Secondary | ICD-10-CM | POA: Insufficient documentation

## 2012-04-27 DIAGNOSIS — G4733 Obstructive sleep apnea (adult) (pediatric): Secondary | ICD-10-CM

## 2012-04-27 DIAGNOSIS — I872 Venous insufficiency (chronic) (peripheral): Secondary | ICD-10-CM

## 2012-04-27 DIAGNOSIS — M1990 Unspecified osteoarthritis, unspecified site: Secondary | ICD-10-CM

## 2012-04-27 DIAGNOSIS — I10 Essential (primary) hypertension: Secondary | ICD-10-CM | POA: Diagnosis not present

## 2012-04-27 DIAGNOSIS — K5730 Diverticulosis of large intestine without perforation or abscess without bleeding: Secondary | ICD-10-CM

## 2012-04-27 MED ORDER — LEVOTHYROXINE SODIUM 50 MCG PO TABS: 50.0000 ug | ORAL_TABLET | Freq: Every day | ORAL | Status: DC
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
## 2012-04-27 NOTE — Patient Instructions (Addendum)
Today we updated your med list in our EPIC system...    Continue your current medications the same...    We refilled your meds today...  We also wrote a new prescription for SYNTHROID (Levothyroxine) 8mg- take one tab daily (best in the AM on an empty stomach)...  Call for any questions...  Let's plan a follow up visit in 135yrsooner if needed for problems...Marland KitchenMarland Kitchen

## 2012-05-07 ENCOUNTER — Ambulatory Visit: Payer: Medicare Other | Admitting: Pulmonary Disease

## 2012-05-26 ENCOUNTER — Other Ambulatory Visit: Payer: Self-pay | Admitting: Pulmonary Disease

## 2012-05-26 MED ORDER — LOSARTAN POTASSIUM 100 MG PO TABS: 100.0000 mg | ORAL_TABLET | Freq: Every day | ORAL | Status: DC

## 2012-05-26 MED ORDER — AMLODIPINE BESYLATE 10 MG PO TABS: 10.0000 mg | ORAL_TABLET | Freq: Every day | ORAL | Status: DC

## 2012-05-26 MED ORDER — FUROSEMIDE 40 MG PO TABS: 40.0000 mg | ORAL_TABLET | Freq: Every day | ORAL | Status: DC

## 2012-06-15 DIAGNOSIS — T8529XA Other mechanical complication of intraocular lens, initial encounter: Secondary | ICD-10-CM | POA: Diagnosis not present

## 2012-06-15 DIAGNOSIS — H35359 Cystoid macular degeneration, unspecified eye: Secondary | ICD-10-CM | POA: Diagnosis not present

## 2012-06-25 ENCOUNTER — Encounter: Payer: Self-pay | Admitting: Pulmonary Disease

## 2012-06-25 ENCOUNTER — Ambulatory Visit (INDEPENDENT_AMBULATORY_CARE_PROVIDER_SITE_OTHER): Payer: Medicare Other | Admitting: Pulmonary Disease

## 2012-06-25 VITALS — BP 142/80 | HR 80 | Temp 98.1°F | Ht 73.0 in | Wt 221.8 lb

## 2012-06-25 DIAGNOSIS — G4733 Obstructive sleep apnea (adult) (pediatric): Secondary | ICD-10-CM | POA: Diagnosis not present

## 2012-06-25 NOTE — Patient Instructions (Addendum)
Stay on cpap, and keep up with mask cushion changes. Work on weight loss Change heater setting on humidifier to get more or less moisture as needed.  followup with me in one year if doing well.

## 2012-06-25 NOTE — Assessment & Plan Note (Signed)
The patient is wearing CPAP very compliantly, and overall feels things are going well.  I have stressed to him the importance of keeping up with his cushion changes, and also reviewed with him how to adjust his heated humidifier.  I have also encouraged him to work aggressively on weight loss.  He will follow up with me in one year doing well.

## 2012-06-25 NOTE — Progress Notes (Signed)
  Subjective:    Patient ID: Justin Hester, male    DOB: [birth year], 79 y.o.   MRN: [id number]  HPI Patient comes in today for followup of his obstructive sleep apnea.  He is wearing CPAP compliantly by his download, and is only having mild breakthrough apnea by his AHI.  His mask cushions are overdue to be replaced, and perhaps he is having small leaks that are leading to his breakthrough events.  He feels that he sleeps well with the device, and is satisfied with his daytime alertness.  His spouse has not heard breakthrough events.  He is having some issues with his humidity, but has not adjusted his heater.   Review of Systems  Constitutional: Negative for fever and unexpected weight change.  HENT: Negative for ear pain, nosebleeds, congestion, sore throat, rhinorrhea, sneezing, trouble swallowing, dental problem, postnasal drip and sinus pressure.   Eyes: Negative for redness and itching.  Respiratory: Negative for cough, chest tightness, shortness of breath and wheezing.   Cardiovascular: Negative for palpitations and leg swelling.  Gastrointestinal: Negative for nausea and vomiting.  Genitourinary: Negative for dysuria.  Musculoskeletal: Negative for joint swelling.  Skin: Negative for rash.  Neurological: Negative for headaches.  Hematological: Does not bruise/bleed easily.  Psychiatric/Behavioral: Negative for dysphoric mood. The patient is not nervous/anxious.        Objective:   Physical Exam Well-developed male in no acute distress Nose without purulence or discharge noted No skin breakdown or pressure necrosis from the CPAP mask Neck without lymphadenopathy or thyromegaly Lower extremities with mild edema, no cyanosis Alert and oriented, does not appear to be sleepy.       Assessment & Plan:

## 2012-07-07 DIAGNOSIS — H524 Presbyopia: Secondary | ICD-10-CM | POA: Diagnosis not present

## 2012-07-07 DIAGNOSIS — Z961 Presence of intraocular lens: Secondary | ICD-10-CM | POA: Diagnosis not present

## 2012-07-14 ENCOUNTER — Telehealth: Payer: Self-pay | Admitting: Pulmonary Disease

## 2012-07-14 NOTE — Telephone Encounter (Signed)
Per SN---   ROV with SN in 1 - 2 months and we will discuss and recheck pt and labs.   thanks

## 2012-07-14 NOTE — Telephone Encounter (Signed)
Called, spoke with pt's wife.  Pt was seen by SN on 04/27/12, was started on synthroid 50 mcg qd, and asked to f/u in 1 yr.  Wife reports pt was advised this is a slight problem with his thryoid.  They would like him to f/u for this sooner than 1 yr - ? 4 or 6 month f/u.  If this is really a slight problem, want to know if med is benefiting pt or not and doesn't want to be on it lifelong if not necessary.  Would like SN's guidance as far as a sooner OV.  Pls advise.  Thank you.

## 2012-07-14 NOTE — Telephone Encounter (Signed)
Called, spoke with pt and pt's wife.  Informed them of below per SN.  They verbalized understanding.  We have scheduled pt OV with SN on July 28 at 3 pm.  They are aware and will call back with any further questions or concerns if needed prior to OV.  Nothing further needed at this time.

## 2012-07-21 ENCOUNTER — Telehealth: Payer: Self-pay | Admitting: Pulmonary Disease

## 2012-07-21 NOTE — Telephone Encounter (Signed)
Pt's wife is aware of SN recommendation. Nothing further was needed.

## 2012-07-21 NOTE — Telephone Encounter (Signed)
Called spoke with [name], she stated that the pharmacy changed manufacturers on pt's Levothyroxine.  [name] to requesting to know if this is okay per SN and she also stated that the pharmacist advised that pt get a TSH done.  Tried to advise [name] that typically the manufacturer does not effect the medication itself, but she is insistent on getting SN's recs.  Last ov 4.8.14, upcoming scheduled for 7.23.14 Last TSH 4.1.14 = 7.72 where he was started on Levothyroxine 42mg  Dr NLenna Gilford AAnne Ngis specifically requesting to know if the medication manufacturer change is ok, if pt needs to keep the 7.23.14 ov w/ you, and if he needs a TSH done either prior to the ov or during.  Thank you so much.

## 2012-07-21 NOTE — Telephone Encounter (Signed)
Per SN---  They are required to be sure their generic manufacture are equivalent.   tsh in 1 month on the new rx.   thanks

## 2012-08-16 ENCOUNTER — Other Ambulatory Visit (INDEPENDENT_AMBULATORY_CARE_PROVIDER_SITE_OTHER): Payer: Medicare Other

## 2012-08-16 ENCOUNTER — Ambulatory Visit (INDEPENDENT_AMBULATORY_CARE_PROVIDER_SITE_OTHER): Payer: Medicare Other | Admitting: Pulmonary Disease

## 2012-08-16 ENCOUNTER — Encounter: Payer: Self-pay | Admitting: Pulmonary Disease

## 2012-08-16 VITALS — BP 122/68 | HR 85 | Temp 98.5°F | Ht 73.0 in | Wt 226.2 lb

## 2012-08-16 DIAGNOSIS — I10 Essential (primary) hypertension: Secondary | ICD-10-CM

## 2012-08-16 DIAGNOSIS — R5381 Other malaise: Secondary | ICD-10-CM | POA: Diagnosis not present

## 2012-08-16 DIAGNOSIS — E039 Hypothyroidism, unspecified: Secondary | ICD-10-CM

## 2012-08-16 DIAGNOSIS — R5383 Other fatigue: Secondary | ICD-10-CM

## 2012-08-16 DIAGNOSIS — G4733 Obstructive sleep apnea (adult) (pediatric): Secondary | ICD-10-CM | POA: Diagnosis not present
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
## 2012-08-16 NOTE — Patient Instructions (Addendum)
Today we updated your med list in our EPIC system...    Continue your current medications the same...  Today we rechecked your TSH (Thyroid blood test)...    We will contact you w/ the results when available...   Call for any questions.Marland KitchenMarland Kitchen

## 2012-08-20 ENCOUNTER — Telehealth: Payer: Self-pay | Admitting: Pulmonary Disease

## 2012-08-20 NOTE — Telephone Encounter (Signed)
LMTCB x1 for pt.  

## 2012-08-20 NOTE — Telephone Encounter (Signed)
I spoke with spouse. She stated in [facility] it shows he is over due for shingles, PNA, and tetanus vaccine. Per spouse she believes he had a tetnus vaccine about 5-10 years ago. My has had shingles in the past but never the vaccine. She does not believe pt has ever had a TDAP.  Last OV 08/16/12 Pending 04/27/13

## 2012-08-23 NOTE — Telephone Encounter (Signed)
lmtcb x1 for pt

## 2012-08-23 NOTE — Telephone Encounter (Signed)
Per SN---  Tetanus/tdap is every 10 years.  Let us know if it was 5 years ago and we can document this and give this again in 5 more years. Pneumovax---gets one after age 79---we can give this here if he wants to come in and get this. Flu is every fall. Shingles is once only.  If they want this we can give the rx for this.  thanks

## 2012-08-24 NOTE — Telephone Encounter (Signed)
Spoke with patient,  Patient is unaware of when or if any of these have been done I have requested paper chart be sent up so that we can figure out if this has been done-- if not patient would like them done Will route to [name] so that she can look out for chart

## 2012-08-25 ENCOUNTER — Other Ambulatory Visit: Payer: Self-pay

## 2012-08-25 DIAGNOSIS — L219 Seborrheic dermatitis, unspecified: Secondary | ICD-10-CM | POA: Diagnosis not present

## 2012-08-27 NOTE — Telephone Encounter (Signed)
Per [name] chart has been received and on SN cart.

## 2012-08-27 NOTE — Telephone Encounter (Signed)
[name], did you ever get the paper chart on this patient? Thanks.

## 2012-09-03 ENCOUNTER — Ambulatory Visit (INDEPENDENT_AMBULATORY_CARE_PROVIDER_SITE_OTHER): Payer: Medicare Other

## 2012-09-03 DIAGNOSIS — Z23 Encounter for immunization: Secondary | ICD-10-CM

## 2012-09-03 NOTE — Telephone Encounter (Signed)
Please advise, thank you!

## 2012-09-03 NOTE — Telephone Encounter (Signed)
Spoke with patient-- Made him aware of below per Dr. Lenna Gilford Patient would like to come in today for Tetanus shot-- He has been palced on injedtion schedule for 4pm Shingles vac Rx placed upfront for his pick up Nothing further needed at this time

## 2012-09-03 NOTE — Telephone Encounter (Signed)
Per SN: looked in paper chart. He had PNA vaccine at the age of 70 (only needs 1 after the age of 36) and then again he had one when he was in the hospital 2007. Does not need any more PNA vaccines.  -We can write RX for shingles vaccine for pt. -tetanus not documented so can be brought in for TDAP -needs to get yearly flu shots each fall thanks  thanks

## 2012-10-05 DIAGNOSIS — H35359 Cystoid macular degeneration, unspecified eye: Secondary | ICD-10-CM | POA: Diagnosis not present

## 2012-10-05 DIAGNOSIS — T8529XA Other mechanical complication of intraocular lens, initial encounter: Secondary | ICD-10-CM | POA: Diagnosis not present

## 2012-11-04 DIAGNOSIS — H20019 Primary iridocyclitis, unspecified eye: Secondary | ICD-10-CM | POA: Diagnosis not present

## 2012-11-05 ENCOUNTER — Telehealth: Payer: Self-pay | Admitting: Pulmonary Disease

## 2012-11-05 NOTE — Telephone Encounter (Signed)
Called and spoke with pts wife and she is aware that per SN--- pt can get the high dose as long as he has never had any problems with taking the regular flu vaccine. She did state that he has never had a problem with taking the regular flu vaccine.  She is aware and nothing further is needed.

## 2012-11-08 ENCOUNTER — Ambulatory Visit (INDEPENDENT_AMBULATORY_CARE_PROVIDER_SITE_OTHER): Payer: Medicare Other

## 2012-11-08 DIAGNOSIS — Z23 Encounter for immunization: Secondary | ICD-10-CM

## 2012-12-17 ENCOUNTER — Other Ambulatory Visit (INDEPENDENT_AMBULATORY_CARE_PROVIDER_SITE_OTHER): Payer: Medicare Other

## 2012-12-17 ENCOUNTER — Other Ambulatory Visit: Payer: Self-pay | Admitting: Pulmonary Disease

## 2012-12-17 DIAGNOSIS — E039 Hypothyroidism, unspecified: Secondary | ICD-10-CM

## 2012-12-21 ENCOUNTER — Telehealth: Payer: Self-pay | Admitting: Pulmonary Disease

## 2012-12-21 NOTE — Telephone Encounter (Signed)
LMTCB x1

## 2013-03-30 ENCOUNTER — Telehealth: Payer: Self-pay | Admitting: *Deleted

## 2013-03-30 NOTE — Telephone Encounter (Signed)
Pts wife will call [facility] and set pt up with new primary care.

## 2013-04-05 DIAGNOSIS — H35359 Cystoid macular degeneration, unspecified eye: Secondary | ICD-10-CM | POA: Diagnosis not present

## 2013-04-05 LAB — HM DIABETES EYE EXAM

## 2013-04-12 ENCOUNTER — Telehealth: Payer: Self-pay | Admitting: Gastroenterology

## 2013-04-12 NOTE — Telephone Encounter (Signed)
Pt is constipated on miralax 1 dose daily,  several enemas past several days with loose watery stools, lower abd pain.  Does not feel like he is emptying.  Pt was offered an appt with extender but preferred to see if he could get in with PCP first he will call back if he needs to try and get an appt with one of our extenders.

## 2013-04-14 ENCOUNTER — Ambulatory Visit (INDEPENDENT_AMBULATORY_CARE_PROVIDER_SITE_OTHER): Payer: Medicare Other | Admitting: Family Medicine

## 2013-04-14 ENCOUNTER — Ambulatory Visit (INDEPENDENT_AMBULATORY_CARE_PROVIDER_SITE_OTHER)
Admission: RE | Admit: 2013-04-14 | Discharge: 2013-04-14 | Disposition: A | Discharge status: Home / Self Care | Payer: Medicare Other | Source: Ambulatory Visit | Attending: Family Medicine | Admitting: Family Medicine

## 2013-04-14 ENCOUNTER — Encounter: Payer: Self-pay | Admitting: Family Medicine

## 2013-04-14 VITALS — BP 122/74 | HR 84 | Temp 97.9°F | Ht 70.5 in | Wt 230.8 lb

## 2013-04-14 DIAGNOSIS — E039 Hypothyroidism, unspecified: Secondary | ICD-10-CM

## 2013-04-14 DIAGNOSIS — G4733 Obstructive sleep apnea (adult) (pediatric): Secondary | ICD-10-CM

## 2013-04-14 DIAGNOSIS — K5900 Constipation, unspecified: Secondary | ICD-10-CM | POA: Diagnosis not present

## 2013-04-14 DIAGNOSIS — I10 Essential (primary) hypertension: Secondary | ICD-10-CM

## 2013-04-14 DIAGNOSIS — Z8672 Personal history of thrombophlebitis: Secondary | ICD-10-CM

## 2013-04-14 MED ORDER — MAGNESIUM HYDROXIDE 800 MG/5ML PO SUSP: 15.0000 mL | Freq: Two times a day (BID) | ORAL | Status: DC | PRN

## 2013-04-14 MED ORDER — LUBIPROSTONE 24 MCG PO CAPS: 24.0000 ug | ORAL_CAPSULE | Freq: Two times a day (BID) | ORAL | Status: DC

## 2013-04-14 NOTE — Progress Notes (Signed)
BP 122/74  Pulse 84  Temp(Src) 97.9 F (36.6 C) (Oral)  Ht 5' 10.5" (1.791 m)  Wt 230 lb 12 oz (104.668 kg)  BMI 32.63 kg/m2  SpO2 97%   CC: transfer of care , constipation Subjective:    Patient ID: Tywan Siever, male    DOB: 1930/11/10, 80 y.o.   MRN: [id number]  HPI: Cuyler Vandyken is a 80 y.o. male presenting on 04/14/2013 for Establish Care and Constipation   Pt presents today with wife to transfer care from Dr. Lenna Gilford.  Acute concern of constipation for [redacted] weeks along with lower abd cramping and bloating.  Urge to defecate but no success.  This started after he had stopped miralax x 1 wk and ate more cheese than he was used to.  Passing gas ok, voiding normally.  Last stool was this afternoon and had diarrhea.  Currently feels fullness without pain.  Appetite normal. Started treating with water enemas and takes miralax 17 gm regularly, bid last 3 days.  Also taking senna x1 week ago.  Also drinking prune juice.  H/o this in past late 2012, at that time started on miralax.  At that time colonoscopy showing mild diverticulosis o/w WNL.    Wt Readings from Last 3 Encounters:  04/14/13 230 lb 12 oz (104.668 kg)  08/16/12 226 lb 3.2 oz (102.604 kg)  06/25/12 221 lb 12.8 oz (100.608 kg)   Lab Results  Component Value Date   CREATININE 1.2 04/20/2012    Relevant past medical, surgical, family and social history reviewed and updated as indicated.  Allergies and medications reviewed and updated. Current Outpatient Prescriptions on File Prior to Visit  Medication Sig  . amLODipine (NORVASC) 10 MG tablet Take 1 tablet (10 mg total) by mouth daily.  Marland Kitchen aspirin 81 MG tablet Take by mouth daily.    . Bromfenac Sodium (PROLENSA) 0.07 % SOLN Place 1 drop into the right eye daily.  . furosemide (LASIX) 40 MG tablet Take 1 tablet (40 mg total) by mouth daily.  Marland Kitchen levothyroxine (SYNTHROID) 50 MCG tablet Take 1 tablet (50 mcg total) by mouth daily before breakfast.  . losartan (COZAAR) 100  MG tablet Take 1 tablet (100 mg total) by mouth daily.  . naproxen (NAPROSYN) 250 MG tablet as needed   . polyethylene glycol (MIRALAX / GLYCOLAX) packet Take 17 g by mouth daily.    . [DISCONTINUED] losartan-hydrochlorothiazide (HYZAAR) 100-25 MG per tablet Take 1 tablet by mouth daily.   No current facility-administered medications on file prior to visit.    Review of Systems Per HPI unless specifically indicated above    Objective:    BP 122/74  Pulse 84  Temp(Src) 97.9 F (36.6 C) (Oral)  Ht 5' 10.5" (1.791 m)  Wt 230 lb 12 oz (104.668 kg)  BMI 32.63 kg/m2  SpO2 97%  Physical Exam  Nursing note and vitals reviewed. Constitutional: He appears well-developed and well-nourished. No distress.  Appears younger than stated age  HENT:  Mouth/Throat: Oropharynx is clear and moist. No oropharyngeal exudate.  Cardiovascular: Normal rate, regular rhythm, normal heart sounds and intact distal pulses.   No murmur heard. Pulmonary/Chest: Effort normal and breath sounds normal. No respiratory distress. He has no wheezes. He has no rales.  Abdominal: Soft. Normal appearance. He exhibits no distension and no mass. Bowel sounds are increased. There is no hepatosplenomegaly. There is no tenderness. There is no rigidity, no rebound, no guarding, no CVA tenderness and negative Murphy's sign.  Genitourinary: Rectum  normal. Rectal exam shows no external hemorrhoid, no internal hemorrhoid, no fissure, no mass, no tenderness and anal tone normal. Prostate is enlarged (20gm). Prostate is not tender.  No stool in vault, no impaction  Musculoskeletal: He exhibits no edema.  Skin: Skin is warm and dry. No rash noted.  Psychiatric: He has a normal mood and affect.       Assessment & Plan:   Problem List Items Addressed This Visit   CONSTIPATION - Primary     Ongoing issues with chronic constipation associated with bloating and cramping and some looser stools as well. Will implement bowel regimen of  amitiza 2mg daily for 3 days then increase to bid dosing, continue miralax bid, and start MOM prn constipation. I've asked pt to call me next week with update.  Pt has f/u appt for physical next month. Doubt diverticulitis as no fever and no significant abd pain on exam, but if no better will obtain CT abd pelvis.    Relevant Orders      DG Abd 2 Views   DEEP VENOUS THROMBOPHLEBITIS, HX OF     Remote, completed treatment    HYPERTENSION     Chronic, stable. Continue treatment.    OBSTRUCTIVE SLEEP APNEA     Chronic, stable continue CPAP nightly.    Unspecified hypothyroidism      Lab Results  Component Value Date   TSH 3.33 12/17/2012  continue current levothyroxine dose.        Follow up plan: Return if symptoms worsen or fail to improve.

## 2013-04-14 NOTE — Assessment & Plan Note (Signed)
Ongoing issues with chronic constipation associated with bloating and cramping and some looser stools as well. Will implement bowel regimen of amitiza 74mg daily for 3 days then increase to bid dosing, continue miralax bid, and start MOM prn constipation. I've asked pt to call me next week with update.  Pt has f/u appt for physical next month. Doubt diverticulitis as no fever and no significant abd pain on exam, but if no better will obtain CT abd pelvis.

## 2013-04-14 NOTE — Progress Notes (Signed)
Pre visit review using our clinic review tool, if applicable. No additional management support is needed unless otherwise documented below in the visit note.

## 2013-04-14 NOTE — Patient Instructions (Addendum)
Continue miralax twice daily with fluids. Start amitiza 24 mcg daily for 3-4 days then may increase to twice daily. May use milk of magnesium as needed for constipation. Xray today. Call me next week with an update.

## 2013-04-14 NOTE — Assessment & Plan Note (Signed)
Chronic, stable. Continue treatment.

## 2013-04-14 NOTE — Assessment & Plan Note (Signed)
Remote, completed treatment

## 2013-04-14 NOTE — Assessment & Plan Note (Signed)
Lab Results  Component Value Date   TSH 3.33 12/17/2012  continue current levothyroxine dose.

## 2013-04-14 NOTE — Assessment & Plan Note (Signed)
Chronic, stable continue CPAP nightly.

## 2013-04-15 ENCOUNTER — Telehealth: Payer: Self-pay

## 2013-04-15 NOTE — Telephone Encounter (Signed)
PA faxed. Advised patient's wife that we probably wouldn't know the status of PA until Monday but that she could check with pharmacy over the weekend.She verbalized understanding.

## 2013-04-15 NOTE — Telephone Encounter (Signed)
fileld and placed in [name]'s box

## 2013-04-15 NOTE — Telephone Encounter (Signed)
Mrs Grover left v/m that pharmacy has sent information for prior auth for amitiza and Mrs Gorelik wants pt on med before end of business day today. Mrs Som request cb.

## 2013-04-21 ENCOUNTER — Other Ambulatory Visit: Payer: Self-pay | Admitting: Pulmonary Disease

## 2013-04-27 ENCOUNTER — Ambulatory Visit: Payer: Medicare Other | Admitting: Pulmonary Disease

## 2013-04-29 ENCOUNTER — Ambulatory Visit (INDEPENDENT_AMBULATORY_CARE_PROVIDER_SITE_OTHER): Payer: Medicare Other | Admitting: Family Medicine

## 2013-04-29 ENCOUNTER — Encounter: Payer: Self-pay | Admitting: Family Medicine

## 2013-04-29 ENCOUNTER — Encounter: Payer: Medicare Other | Admitting: Family Medicine

## 2013-04-29 VITALS — BP 124/72 | HR 77 | Temp 97.9°F | Ht 71.25 in | Wt 228.8 lb

## 2013-04-29 DIAGNOSIS — E7800 Pure hypercholesterolemia, unspecified: Secondary | ICD-10-CM | POA: Diagnosis not present

## 2013-04-29 DIAGNOSIS — Z23 Encounter for immunization: Secondary | ICD-10-CM | POA: Diagnosis not present

## 2013-04-29 DIAGNOSIS — R109 Unspecified abdominal pain: Secondary | ICD-10-CM

## 2013-04-29 DIAGNOSIS — K219 Gastro-esophageal reflux disease without esophagitis: Secondary | ICD-10-CM

## 2013-04-29 DIAGNOSIS — Z125 Encounter for screening for malignant neoplasm of prostate: Secondary | ICD-10-CM | POA: Diagnosis not present

## 2013-04-29 DIAGNOSIS — Z0000 Encounter for general adult medical examination without abnormal findings: Secondary | ICD-10-CM

## 2013-04-29 DIAGNOSIS — E039 Hypothyroidism, unspecified: Secondary | ICD-10-CM

## 2013-04-29 DIAGNOSIS — J984 Other disorders of lung: Secondary | ICD-10-CM

## 2013-04-29 DIAGNOSIS — Z8672 Personal history of thrombophlebitis: Secondary | ICD-10-CM

## 2013-04-29 DIAGNOSIS — K5900 Constipation, unspecified: Secondary | ICD-10-CM | POA: Diagnosis not present

## 2013-04-29 DIAGNOSIS — I10 Essential (primary) hypertension: Secondary | ICD-10-CM

## 2013-04-29 DIAGNOSIS — D649 Anemia, unspecified: Secondary | ICD-10-CM

## 2013-04-29 LAB — COMPREHENSIVE METABOLIC PANEL
ALT: 23 U/L (ref 0–53)
AST: 24 U/L (ref 0–37)
Albumin: 3.5 g/dL (ref 3.5–5.2)
Alkaline Phosphatase: 52 U/L (ref 39–117)
BILIRUBIN TOTAL: 0.6 mg/dL (ref 0.3–1.2)
BUN: 15 mg/dL (ref 6–23)
CO2: 23 mEq/L (ref 19–32)
CREATININE: 1 mg/dL (ref 0.4–1.5)
Calcium: 8.9 mg/dL (ref 8.4–10.5)
Chloride: 108 mEq/L (ref 96–112)
GFR: 88.26 mL/min (ref >60.00)
Glucose, Bld: 97 mg/dL (ref 70–99)
Potassium: 4.3 mEq/L (ref 3.5–5.1)
Sodium: 141 mEq/L (ref 135–145)
Total Protein: 7.8 g/dL (ref 6.0–8.3)

## 2013-04-29 LAB — LIPID PANEL
CHOL/HDL RATIO: 4
Cholesterol: 148 mg/dL (ref 0–200)
HDL: 38.3 mg/dL — AB (ref >39.00)
LDL CALC: 102 mg/dL — AB (ref 0–99)
TRIGLYCERIDES: 38 mg/dL (ref 0.0–149.0)
VLDL: 7.6 mg/dL (ref 0.0–40.0)

## 2013-04-29 LAB — CBC WITH DIFFERENTIAL/PLATELET
BASOS PCT: 0.6 % (ref 0.0–3.0)
Basophils Absolute: 0 10*3/uL (ref 0.0–0.1)
EOS PCT: 1.2 % (ref 0.0–5.0)
Eosinophils Absolute: 0.1 10*3/uL (ref 0.0–0.7)
HCT: 37.9 % — ABNORMAL LOW (ref 39.0–52.0)
Hemoglobin: 12.6 g/dL — ABNORMAL LOW (ref 13.0–17.0)
LYMPHS PCT: 35.4 % (ref 12.0–46.0)
Lymphs Abs: 2.6 10*3/uL (ref 0.7–4.0)
MCHC: 33.2 g/dL (ref 30.0–36.0)
MCV: 92.4 fl (ref 78.0–100.0)
Monocytes Absolute: 0.7 10*3/uL (ref 0.1–1.0)
Monocytes Relative: 9.1 % (ref 3.0–12.0)
Neutro Abs: 4 10*3/uL (ref 1.4–7.7)
Neutrophils Relative %: 53.7 % (ref 43.0–77.0)
Platelets: 347 10*3/uL (ref 150.0–400.0)
RBC: 4.1 Mil/uL — AB (ref 4.22–5.81)
RDW: 13.2 % (ref 11.5–14.6)
WBC: 7.4 10*3/uL (ref 4.5–10.5)

## 2013-04-29 LAB — PSA, MEDICARE: PSA: 2.13 ng/ml (ref 0.10–4.00)

## 2013-04-29 LAB — TSH: TSH: 3.79 u[IU]/mL (ref 0.35–5.50)

## 2013-04-29 LAB — LIPASE: Lipase: 35 U/L (ref 11.0–59.0)

## 2013-04-29 NOTE — Patient Instructions (Addendum)
Let's decrease bowel regimen to miralax once daily and stop milk of magnesia, may take if fullness/uncomfortable feeling returns, but I want to see how stools are on a more normal routine. prevnar today. Bring me copy of advanced directive. Blood work today then we will call you with plan. Let me know sooner if worsening discomfort or other concerns.

## 2013-04-29 NOTE — Assessment & Plan Note (Addendum)
I have personally reviewed the Medicare Annual Wellness questionnaire and have noted 1. The patient's medical and social history 2. Their use of alcohol, tobacco or illicit drugs 3. Their current medications and supplements 4. The patient's functional ability including ADL's, fall risks, home safety risks and hearing or visual impairment. 5. Diet and physical activity 6. Evidence for depression or mood disorders The patients weight, height, BMI have been recorded in the chart.  Hearing and vision has been addressed. I have made referrals, counseling and provided education to the patient based review of the above and I have provided the pt with a written personalized care plan for preventive services. See scanned questionairre. Advanced directives discussed: wife is HCPOA.  Pt will bring advanced directive to update chart  Reviewed preventative protocols and updated unless pt declined. Prevnar today. Recent DRE stable last month.  Check PSA today per pt request

## 2013-04-29 NOTE — Progress Notes (Signed)
Pre visit review using our clinic review tool, if applicable. No additional management support is needed unless otherwise documented below in the visit note.

## 2013-04-29 NOTE — Progress Notes (Signed)
BP 124/72  Pulse 77  Temp(Src) 97.9 F (36.6 C) (Oral)  Ht 5' 11.25" (1.81 m)  Wt 228 lb 12 oz (103.76 kg)  BMI 31.67 kg/m2  SpO2 98%   CC: medicare wellness visit  Subjective:    Patient ID: Justin Hester, male    DOB: 10/07/1930, 80 y.o.   MRN: [id number]  HPI: Justin Hester is a 80 y.o. male presenting on 04/29/2013 for Annual Exam   Constipation - continued fullness sensation in lower abdomen.  Having 1-3 soft stools a day but still feels full.  Eats fruits and vegetables daily.  Appetite ok some decreased but no weight loss noted. No abd pain, no fevers/chills.  Persistent feeling of fullness.  No increasing gassiness.  Staying very worried about this change in bowel movements over last several months.  H/o 1cm pancreatic cyst on imaging 2012 CT - pt had declined further eval.  Now may be interested in rpt imaging given new findings.  No weight loss noted, dysphagia, nausea/vomiting, early satiety, dyspepsia.    Wt Readings from Last 3 Encounters:  04/29/13 228 lb 12 oz (103.76 kg)  04/14/13 230 lb 12 oz (104.668 kg)  08/16/12 226 lb 3.2 oz (102.604 kg)  reviewing chart, also with h/o mult pulm nodules from 2006.  Nonsmoker.  Noticing left leg swelling more pronounced than in the past.  No redness or tenderness.  H/o L DVT in past (1990s).  Hearing screen passed today Vision screen recently at eye doctor 03/2013 Denies falls, depression/anhedonia.  Preventative: Colonoscopy 2012 mild diverticulosis o/w WNL Justin Hester) Prostate cancer screening - after extensive discussion of pros/cons of continued screening at advanced age, pt/wife desire to continue screening for prostate cancer Flu 2014 Tdap 2014 Pneumovax - done in past.  Interested in [place] today. zostavax - too expensive. Advanced directives: has set up - wife is HCPOA then oldest daughter.  Lives with wife. Exercises 1 mile a day Plays golf  Relevant past medical, surgical, family and social history  reviewed and updated as indicated.  Allergies and medications reviewed and updated. Current Outpatient Prescriptions on File Prior to Visit  Medication Sig  . aspirin 81 MG tablet Take by mouth daily.    . Bromfenac Sodium (PROLENSA) 0.07 % SOLN Place 1 drop into the right eye daily.  . magnesium hydroxide (MILK OF MAGNESIA) 800 MG/5ML suspension Take 15 mLs by mouth 2 (two) times daily as needed for constipation.  . naproxen (NAPROSYN) 250 MG tablet as needed   . polyethylene glycol (MIRALAX / GLYCOLAX) packet Take 17 g by mouth daily.    . [DISCONTINUED] losartan-hydrochlorothiazide (HYZAAR) 100-25 MG per tablet Take 1 tablet by mouth daily.   No current facility-administered medications on file prior to visit.    Review of Systems Per HPI unless specifically indicated above    Objective:    BP 124/72  Pulse 77  Temp(Src) 97.9 F (36.6 C) (Oral)  Ht 5' 11.25" (1.81 m)  Wt 228 lb 12 oz (103.76 kg)  BMI 31.67 kg/m2  SpO2 98%  Physical Exam  Nursing note and vitals reviewed. Constitutional: He is oriented to person, place, and time. He appears well-developed and well-nourished. No distress.  HENT:  Head: Normocephalic and atraumatic.  Right Ear: Hearing, tympanic membrane, external ear and ear canal normal.  Left Ear: Hearing, tympanic membrane, external ear and ear canal normal.  Nose: Nose normal.  Mouth/Throat: Uvula is midline, oropharynx is clear and moist and mucous membranes are normal. No oropharyngeal exudate,  posterior oropharyngeal edema or posterior oropharyngeal erythema.  Eyes: Conjunctivae and EOM are normal. Pupils are equal, round, and reactive to light. No scleral icterus.  Neck: Normal range of motion. Neck supple. Carotid bruit is not present. No thyromegaly present.  Cardiovascular: Normal rate, regular rhythm, normal heart sounds and intact distal pulses.   No murmur heard. Pulses:      Radial pulses are 2+ on the right side, and 2+ on the left side.    Pulmonary/Chest: Effort normal and breath sounds normal. No respiratory distress. He has no wheezes. He has no rales.  Abdominal: Soft. Normal appearance and bowel sounds are normal. He exhibits no distension and no mass. There is no hepatosplenomegaly. There is no tenderness. There is no rigidity, no rebound, no guarding, no CVA tenderness and negative Murphy's sign.  Musculoskeletal: Normal range of motion. He exhibits edema (chronic 1+ pitting adema L>R).  No hip pain or thigh pain S/p bilat knee replacements  Lymphadenopathy:    He has no cervical adenopathy.  Neurological: He is alert and oriented to person, place, and time.  CN grossly intact, station and gait intact Recall 3/3 Calculation 5/5 serial 3s, 4/5 serial 7s  Skin: Skin is warm and dry. No rash noted.  Psychiatric: He has a normal mood and affect. His behavior is normal. Judgment and thought content normal.   Results for orders placed in visit on 04/29/13  LIPID PANEL      Result Value Ref Range   Cholesterol 148  0 - 200 mg/dL   Triglycerides 38.0  0.0 - 149.0 mg/dL   HDL 38.30 (*) >39.00 mg/dL   VLDL 7.6  0.0 - 40.0 mg/dL   LDL Cholesterol 102 (*) 0 - 99 mg/dL   Total CHOL/HDL Ratio 4    COMPREHENSIVE METABOLIC PANEL      Result Value Ref Range   Sodium 141  135 - 145 mEq/L   Potassium 4.3  3.5 - 5.1 mEq/L   Chloride 108  96 - 112 mEq/L   CO2 23  19 - 32 mEq/L   Glucose, Bld 97  70 - 99 mg/dL   BUN 15  6 - 23 mg/dL   Creatinine, Ser 1.0  0.4 - 1.5 mg/dL   Total Bilirubin 0.6  0.3 - 1.2 mg/dL   Alkaline Phosphatase 52  39 - 117 U/L   AST 24  0 - 37 U/L   ALT 23  0 - 53 U/L   Total Protein 7.8  6.0 - 8.3 g/dL   Albumin 3.5  3.5 - 5.2 g/dL   Calcium 8.9  8.4 - 10.5 mg/dL   GFR 88.26  >60.00 mL/min  TSH      Result Value Ref Range   TSH 3.79  0.35 - 5.50 uIU/mL  PSA, MEDICARE      Result Value Ref Range   PSA 2.13  0.10 - 4.00 ng/ml  CBC WITH DIFFERENTIAL      Result Value Ref Range   WBC 7.4  4.5 -  10.5 K/uL   RBC 4.10 (*) 4.22 - 5.81 Mil/uL   Hemoglobin 12.6 (*) 13.0 - 17.0 g/dL   HCT 37.9 (*) 39.0 - 52.0 %   MCV 92.4  78.0 - 100.0 fl   MCHC 33.2  30.0 - 36.0 g/dL   RDW 13.2  11.5 - 14.6 %   Platelets 347.0  150.0 - 400.0 K/uL   Neutrophils Relative % 53.7  43.0 - 77.0 %   Lymphocytes Relative 35.4  12.0 - 46.0 %   Monocytes Relative 9.1  3.0 - 12.0 %   Eosinophils Relative 1.2  0.0 - 5.0 %   Basophils Relative 0.6  0.0 - 3.0 %   Neutro Abs 4.0  1.4 - 7.7 K/uL   Lymphs Abs 2.6  0.7 - 4.0 K/uL   Monocytes Absolute 0.7  0.1 - 1.0 K/uL   Eosinophils Absolute 0.1  0.0 - 0.7 K/uL   Basophils Absolute 0.0  0.0 - 0.1 K/uL  LIPASE      Result Value Ref Range   Lipase 35.0  11.0 - 59.0 U/L  HM DIABETES EYE EXAM      Result Value Ref Range   HM Diabetic Eye Exam No Retinopathy  No Retinopathy      Assessment & Plan:   Problem List Items Addressed This Visit   HYPERCHOLESTEROLEMIA     Prior off meds - check FLP.    Relevant Medications      furosemide (LASIX) tablet      losartan (COZAAR) tablet      amLODIpine (NORVASC) tablet   Other Relevant Orders      Lipid panel (Completed)      Comprehensive metabolic panel (Completed)   ANEMIA     Recheck CBC today.    HYPERTENSION     Chronic, stable. Continue current regimen.    Relevant Medications      furosemide (LASIX) tablet      losartan (COZAAR) tablet      amLODIpine (NORVASC) tablet   PULMONARY NODULE   GERD     Denies sxs currently.    DEEP VENOUS THROMBOPHLEBITIS, HX OF     No evidence of recurrent DVT.  Encouraged elevation of legs.  Continue double compression stockings.    Bilateral lower abdominal discomfort - Primary     Actually constipation has improved, now having several small soft stools but never with sensation of emptying completely - concern for overtreatment with MOM and Miralax.  rec stop MOM and continue miralax 1 time daily.   Reviewing records, h/o pancreatic cyst 2012, rec rpt abd imaging  which pt had declined in past - pt states he wasn't recommended to undergo imaging.  Also h/o multiple pulm nodules back 2006. Will check labwork including CBC, CMP, lipase and TSH to further evaluate abdominal fullness.   Stable abd exam today. If normal labwork, will start workup with rpt imaging with CT abd/pelvis with contrast to eval bowels and lower abdomen for mass or other etiology of persistent abd fullness and change in bowels over last 4 months. Known h/o diverticulosis ?irritation of this leading to some sxs.    Hypothyroidism     Check TSH today.    Relevant Medications      levothyroxine (SYNTHROID, LEVOTHROID) tablet   Medicare annual wellness visit, subsequent     I have personally reviewed the Medicare Annual Wellness questionnaire and have noted 1. The patient's medical and social history 2. Their use of alcohol, tobacco or illicit drugs 3. Their current medications and supplements 4. The patient's functional ability including ADL's, fall risks, home safety risks and hearing or visual impairment. 5. Diet and physical activity 6. Evidence for depression or mood disorders The patients weight, height, BMI have been recorded in the chart.  Hearing and vision has been addressed. I have made referrals, counseling and provided education to the patient based review of the above and I have provided the pt with a written personalized care plan for preventive  services. See scanned questionairre. Advanced directives discussed: wife is HCPOA.  Pt will bring advanced directive to update chart  Reviewed preventative protocols and updated unless pt declined. Prevnar today. Recent DRE stable last month.  Check PSA today per pt request     Other Visit Diagnoses   Abdominal discomfort        Relevant Orders       CBC with Differential (Completed)       Lipase (Completed)    Special screening for malignant neoplasm of prostate        Relevant Orders       PSA, Medicare (Completed)     Need for vaccination with 13-polyvalent pneumococcal conjugate vaccine        Relevant Orders       Pneumococcal conjugate vaccine 13-valent (Completed)        Follow up plan: Return if symptoms worsen or fail to improve.

## 2013-04-30 ENCOUNTER — Encounter: Payer: Self-pay | Admitting: Family Medicine

## 2013-04-30 ENCOUNTER — Other Ambulatory Visit: Payer: Self-pay | Admitting: Family Medicine

## 2013-04-30 DIAGNOSIS — R194 Change in bowel habit: Secondary | ICD-10-CM

## 2013-04-30 DIAGNOSIS — K862 Cyst of pancreas: Secondary | ICD-10-CM

## 2013-04-30 DIAGNOSIS — R1031 Right lower quadrant pain: Secondary | ICD-10-CM

## 2013-04-30 DIAGNOSIS — D649 Anemia, unspecified: Secondary | ICD-10-CM

## 2013-04-30 DIAGNOSIS — R1032 Left lower quadrant pain: Principal | ICD-10-CM

## 2013-04-30 MED ORDER — LEVOTHYROXINE SODIUM 50 MCG PO TABS: ORAL_TABLET | ORAL | Status: DC

## 2013-04-30 MED ORDER — AMLODIPINE BESYLATE 10 MG PO TABS: 10.0000 mg | ORAL_TABLET | Freq: Every day | ORAL | Status: DC

## 2013-04-30 MED ORDER — FUROSEMIDE 40 MG PO TABS: 40.0000 mg | ORAL_TABLET | Freq: Every day | ORAL | Status: DC

## 2013-04-30 MED ORDER — LOSARTAN POTASSIUM 100 MG PO TABS: 100.0000 mg | ORAL_TABLET | Freq: Every day | ORAL | Status: DC

## 2013-04-30 NOTE — Assessment & Plan Note (Signed)
Check TSH today.

## 2013-04-30 NOTE — Assessment & Plan Note (Signed)
Chronic, stable. Continue current regimen.

## 2013-04-30 NOTE — Assessment & Plan Note (Signed)
Denies sxs currently.

## 2013-04-30 NOTE — Assessment & Plan Note (Signed)
Recheck CBC today.

## 2013-04-30 NOTE — Assessment & Plan Note (Signed)
No evidence of recurrent DVT.  Encouraged elevation of legs.  Continue double compression stockings.

## 2013-04-30 NOTE — Assessment & Plan Note (Addendum)
Actually constipation has improved, now having several small soft stools but never with sensation of emptying completely - concern for overtreatment with MOM and Miralax.  rec stop MOM and continue miralax 1 time daily.   Reviewing records, h/o pancreatic cyst 2012, rec rpt abd imaging which pt had declined in past - pt states he wasn't recommended to undergo imaging.  Also h/o multiple pulm nodules back 2006. Will check labwork including CBC, CMP, lipase and TSH to further evaluate abdominal fullness.   Stable abd exam today. If normal labwork, will start workup with rpt imaging with CT abd/pelvis with contrast to eval bowels and lower abdomen for mass or other etiology of persistent abd fullness and change in bowels over last 4 months. Known h/o diverticulosis ?irritation of this leading to some sxs.

## 2013-04-30 NOTE — Assessment & Plan Note (Signed)
Prior off meds - check FLP.

## 2013-05-02 ENCOUNTER — Other Ambulatory Visit: Payer: Self-pay | Admitting: *Deleted

## 2013-05-05 DIAGNOSIS — Z961 Presence of intraocular lens: Secondary | ICD-10-CM | POA: Diagnosis not present

## 2013-05-09 ENCOUNTER — Ambulatory Visit (INDEPENDENT_AMBULATORY_CARE_PROVIDER_SITE_OTHER)
Admission: RE | Admit: 2013-05-09 | Discharge: 2013-05-09 | Disposition: A | Discharge status: Home / Self Care | Payer: Medicare Other | Source: Ambulatory Visit | Attending: Family Medicine | Admitting: Family Medicine

## 2013-05-09 DIAGNOSIS — K862 Cyst of pancreas: Secondary | ICD-10-CM

## 2013-05-09 DIAGNOSIS — R1031 Right lower quadrant pain: Secondary | ICD-10-CM

## 2013-05-09 DIAGNOSIS — D649 Anemia, unspecified: Secondary | ICD-10-CM | POA: Diagnosis not present

## 2013-05-09 DIAGNOSIS — R198 Other specified symptoms and signs involving the digestive system and abdomen: Secondary | ICD-10-CM

## 2013-05-09 DIAGNOSIS — R1032 Left lower quadrant pain: Secondary | ICD-10-CM

## 2013-05-09 DIAGNOSIS — R194 Change in bowel habit: Secondary | ICD-10-CM

## 2013-05-09 DIAGNOSIS — K863 Pseudocyst of pancreas: Secondary | ICD-10-CM

## 2013-05-09 DIAGNOSIS — K4090 Unilateral inguinal hernia, without obstruction or gangrene, not specified as recurrent: Secondary | ICD-10-CM | POA: Diagnosis not present

## 2013-05-09 MED ORDER — IOHEXOL 300 MG/ML  SOLN
100.0000 mL | Freq: Once | INTRAMUSCULAR | Status: AC | PRN
  Administered 2013-05-09: 100 mL via INTRAVENOUS

## 2013-05-10 ENCOUNTER — Encounter: Payer: Self-pay | Admitting: *Deleted

## 2013-05-13 ENCOUNTER — Telehealth: Payer: Self-pay | Admitting: Family Medicine

## 2013-05-13 NOTE — Telephone Encounter (Signed)
[name], I tried to phone the patient and only got the VM. Dr. Damita Dunnings says to explain that everything was fine and that some of the information given in the letter was probably confusing but the report is good and that Dr. Darnell Level will go over it more at his next visit.

## 2013-05-13 NOTE — Telephone Encounter (Signed)
Message left advising patient that CT was fine per Dr. Damita Dunnings and that Dr. Darnell Level would discuss it more detail at his next visit.

## 2013-05-13 NOTE — Telephone Encounter (Signed)
Please call him back- see the letter from [facility]. Thanks.

## 2013-05-13 NOTE — Telephone Encounter (Signed)
Pt called very confused about some correspondence he received in mail from Dr. Damita Dunnings saying he needed to reach him about some results but could not because we didn't have correct phone numbers on file to contact him.  I looked at his file and went over the telephone numbers with him and they were both correct.  Home - [phone] and Cell - [phone].  He is looking for a call from Dr. Damita Dunnings to discuss his results.

## 2013-05-15 ENCOUNTER — Encounter: Payer: Self-pay | Admitting: Family Medicine

## 2013-06-20 ENCOUNTER — Other Ambulatory Visit: Payer: Self-pay | Admitting: Pulmonary Disease

## 2013-06-21 ENCOUNTER — Other Ambulatory Visit: Payer: Self-pay | Admitting: *Deleted

## 2013-06-21 MED ORDER — AMLODIPINE BESYLATE 10 MG PO TABS: 10.0000 mg | ORAL_TABLET | Freq: Every day | ORAL | Status: DC

## 2013-06-21 MED ORDER — LOSARTAN POTASSIUM 100 MG PO TABS
100.0000 mg | ORAL_TABLET | Freq: Every day | ORAL | Status: DC
Start: 2013-06-21 — End: 2014-06-09

## 2013-06-21 MED ORDER — FUROSEMIDE 40 MG PO TABS: 40.0000 mg | ORAL_TABLET | Freq: Every day | ORAL | Status: DC

## 2013-06-27 ENCOUNTER — Ambulatory Visit (INDEPENDENT_AMBULATORY_CARE_PROVIDER_SITE_OTHER): Payer: Medicare Other | Admitting: Pulmonary Disease

## 2013-06-27 ENCOUNTER — Telehealth: Payer: Self-pay | Admitting: Pulmonary Disease

## 2013-06-27 ENCOUNTER — Encounter: Payer: Self-pay | Admitting: Pulmonary Disease

## 2013-06-27 VITALS — BP 120/82 | HR 83 | Temp 98.1°F | Ht 73.0 in | Wt 226.4 lb

## 2013-06-27 DIAGNOSIS — G4733 Obstructive sleep apnea (adult) (pediatric): Secondary | ICD-10-CM | POA: Diagnosis not present

## 2013-06-27 NOTE — Patient Instructions (Signed)
Will send an order to your homecare company to see if you qualify for a new machine.  Will also have them put your machine on auto for a few weeks to re-optimize pressure since you are having breakthru apnea Work on weight loss Keep up with mask cushion changes. followup with me again in one year if doing well.

## 2013-06-27 NOTE — Progress Notes (Signed)
   Subjective:    Patient ID: Justin Hester, male    DOB: 1930/08/10, 80 y.o.   MRN: [id number]  HPI The patient comes in today for followup of his obstructive sleep apnea. He is wearing CPAP compliantly by his download, but does not feel as rested in the mornings upon arising. However, he does not have any issues with excessive daytime sleepiness. He is been keeping up with his mask changes, and his wife has not heard breakthrough snoring. His download today shows breakthrough apneas with an AHI average of 10 events per hour.  I have reviewed the download with him in detail, and answered all of his questions.   Review of Systems  Constitutional: Negative for fever and unexpected weight change.  HENT: Negative for congestion, dental problem, ear pain, nosebleeds, postnasal drip, rhinorrhea, sinus pressure, sneezing, sore throat and trouble swallowing.   Eyes: Negative for redness and itching.  Respiratory: Negative for cough, chest tightness, shortness of breath and wheezing.   Cardiovascular: Negative for palpitations and leg swelling.  Gastrointestinal: Negative for nausea and vomiting.  Genitourinary: Negative for dysuria.  Musculoskeletal: Negative for joint swelling.  Skin: Negative for rash.  Neurological: Negative for headaches.  Hematological: Does not bruise/bleed easily.  Psychiatric/Behavioral: Negative for dysphoric mood. The patient is not nervous/anxious.        Objective:   Physical Exam Overweight male in no acute distress Nose without purulence or discharge noted No skin breakdown or pressure necrosis from the CPAP mask Neck without lymphadenopathy or thyromegaly Chest is clear Lower extremities without edema, no cyanosis Alert and oriented, does not appear to sleepy, moves all 4 extremities.       Assessment & Plan:

## 2013-06-27 NOTE — Assessment & Plan Note (Signed)
The patient is wearing CPAP compliantly, but he does not feel that he is sleeping as well as in the past. However, this is not translating into increased sleepiness during the day. His download shows breakthrough apneas, with an AHI greater than 10 events per hour, and we will need to re\re optimize his pressure. Will use the automatic setting in order to accomplish this. I've also encouraged the patient to work on modest weight loss.

## 2013-06-28 NOTE — Telephone Encounter (Signed)
done

## 2013-07-18 ENCOUNTER — Telehealth: Payer: Self-pay | Admitting: Pulmonary Disease

## 2013-07-18 DIAGNOSIS — G4733 Obstructive sleep apnea (adult) (pediatric): Secondary | ICD-10-CM

## 2013-07-18 NOTE — Telephone Encounter (Signed)
I called spoke with pt. He took his data card over to [employer] and will be sending report over to [place]. Pt reports [place] advised him to call him as soon as he did so we can get the download and call with results. Will forward to [place] to look out for results

## 2013-07-27 NOTE — Telephone Encounter (Signed)
See if pt got a new machine? Let him know the download sent to [place] did not have all of the info.  It is missing the summary sheet..  We need to get this from them to comment on download.

## 2013-07-27 NOTE — Telephone Encounter (Signed)
Download received and placed in [facility] look-at to review. [place] Bing, CMA

## 2013-07-28 NOTE — Telephone Encounter (Signed)
Called spoke with pt. He did not receive a new machine. I advised pt I will contact [place]. Called [employer] [phone] with Estill Bamberg. She reports pt will be eligible for new machine 08/20/13. They will send CMN over once he is due. Also a download will be faxed to [facility] triage fax machine. Pt is aware of the above Will await fax

## 2013-07-28 NOTE — Telephone Encounter (Signed)
No fax has been received yet Will call [employer] back in the AM

## 2013-07-29 NOTE — Telephone Encounter (Signed)
Called and spoke with Leafy Ro with [employer] and she is going to fax over the download to the triage fax.

## 2013-07-29 NOTE — Telephone Encounter (Signed)
Do not see in my green folder

## 2013-07-29 NOTE — Telephone Encounter (Signed)
Download received is not correct.  I called [employer] back [phone] LMTCB x1 for RT

## 2013-07-29 NOTE — Telephone Encounter (Signed)
Download received and placed in [facility] look at. Please advise thanks

## 2013-08-03 NOTE — Telephone Encounter (Signed)
Called and spoke to [place] with [employer]. She stated that they have sent over all the available data on the previous downloads and that the pt's specific machine does not show the leak.  [place] please advise further.

## 2013-08-03 NOTE — Telephone Encounter (Signed)
Called and spoke to pt. Informed pt of recs per [place]. Pt verbalized understanding and denied any further questions or concerns at this time. Pt aware someone will be contacting him to have his pressure changed. Nothing further needed.   Order placed for pressure change.

## 2013-08-03 NOTE — Telephone Encounter (Signed)
It is not the leak data I was looking for, but the [place] regarding median/95/max pressure, as well as average AHI

## 2013-08-03 NOTE — Telephone Encounter (Signed)
Download received and giving to [facility] to ensure this is what [facility] is looking for.

## 2013-08-03 NOTE — Telephone Encounter (Signed)
Have his cpap  Machine set on 14cm. Once 08/20/13 comes will order him a new machine   Call to remind [place].

## 2013-08-03 NOTE — Telephone Encounter (Signed)
Another download received and placed in [facility] look-at. [place] Bing, CMA

## 2013-08-03 NOTE — Telephone Encounter (Signed)
I called spoke with Drue Dun from [place]. She will see what she can get and fax this over to triage with what [facility] requests

## 2013-09-06 ENCOUNTER — Telehealth: Payer: Self-pay | Admitting: Pulmonary Disease

## 2013-09-06 DIAGNOSIS — L219 Seborrheic dermatitis, unspecified: Secondary | ICD-10-CM | POA: Diagnosis not present

## 2013-09-06 NOTE — Telephone Encounter (Signed)
Pt says that he was talking w/someone and the call was dropped can be reached @ [phone].Hillery Hunter

## 2013-09-06 NOTE — Telephone Encounter (Signed)
lmomtcb x 1

## 2013-09-06 NOTE — Telephone Encounter (Signed)
Pt is calling back [phone]

## 2013-09-06 NOTE — Telephone Encounter (Signed)
lmomtcb x1

## 2013-09-06 NOTE — Telephone Encounter (Signed)
Called spoke with pt. He just wanted to know his last CPAP settings we have on file. Made him aware per phone note in July he is set at 14 cm. Nothing further needed

## 2013-09-06 NOTE — Telephone Encounter (Signed)
Patient returning call.  [phone]

## 2013-09-19 ENCOUNTER — Telehealth: Payer: Self-pay | Admitting: Family Medicine

## 2013-09-19 NOTE — Telephone Encounter (Signed)
In your IN box for completion.

## 2013-09-19 NOTE — Telephone Encounter (Signed)
Placed in [name]'s box. plz check with pt/wife what indication is for handicap placard - may review per list on sheet. i know he has remote h/o DVT as well as arthritis - see if currently limits ability to walk.

## 2013-09-19 NOTE — Telephone Encounter (Signed)
Difficult/limited to walk long distances due to arthritis. Filled out and in your IN box for signature.

## 2013-09-19 NOTE — Telephone Encounter (Signed)
Pt spouse dropped off handicap form On kim's desk

## 2013-09-20 NOTE — Telephone Encounter (Signed)
Filled and placed in [name]'s box. 

## 2013-09-20 NOTE — Telephone Encounter (Signed)
Placed up front for pick up. Patient aware.

## 2013-10-15 IMAGING — CR DG CHEST 2V
2 series · 2 of 2 positions shown · non-contrast
Comparison: 12/06/2008

CLINICAL DATA: Physical examination

CHEST - 2 VIEW

[view not recorded (1 of 2)]
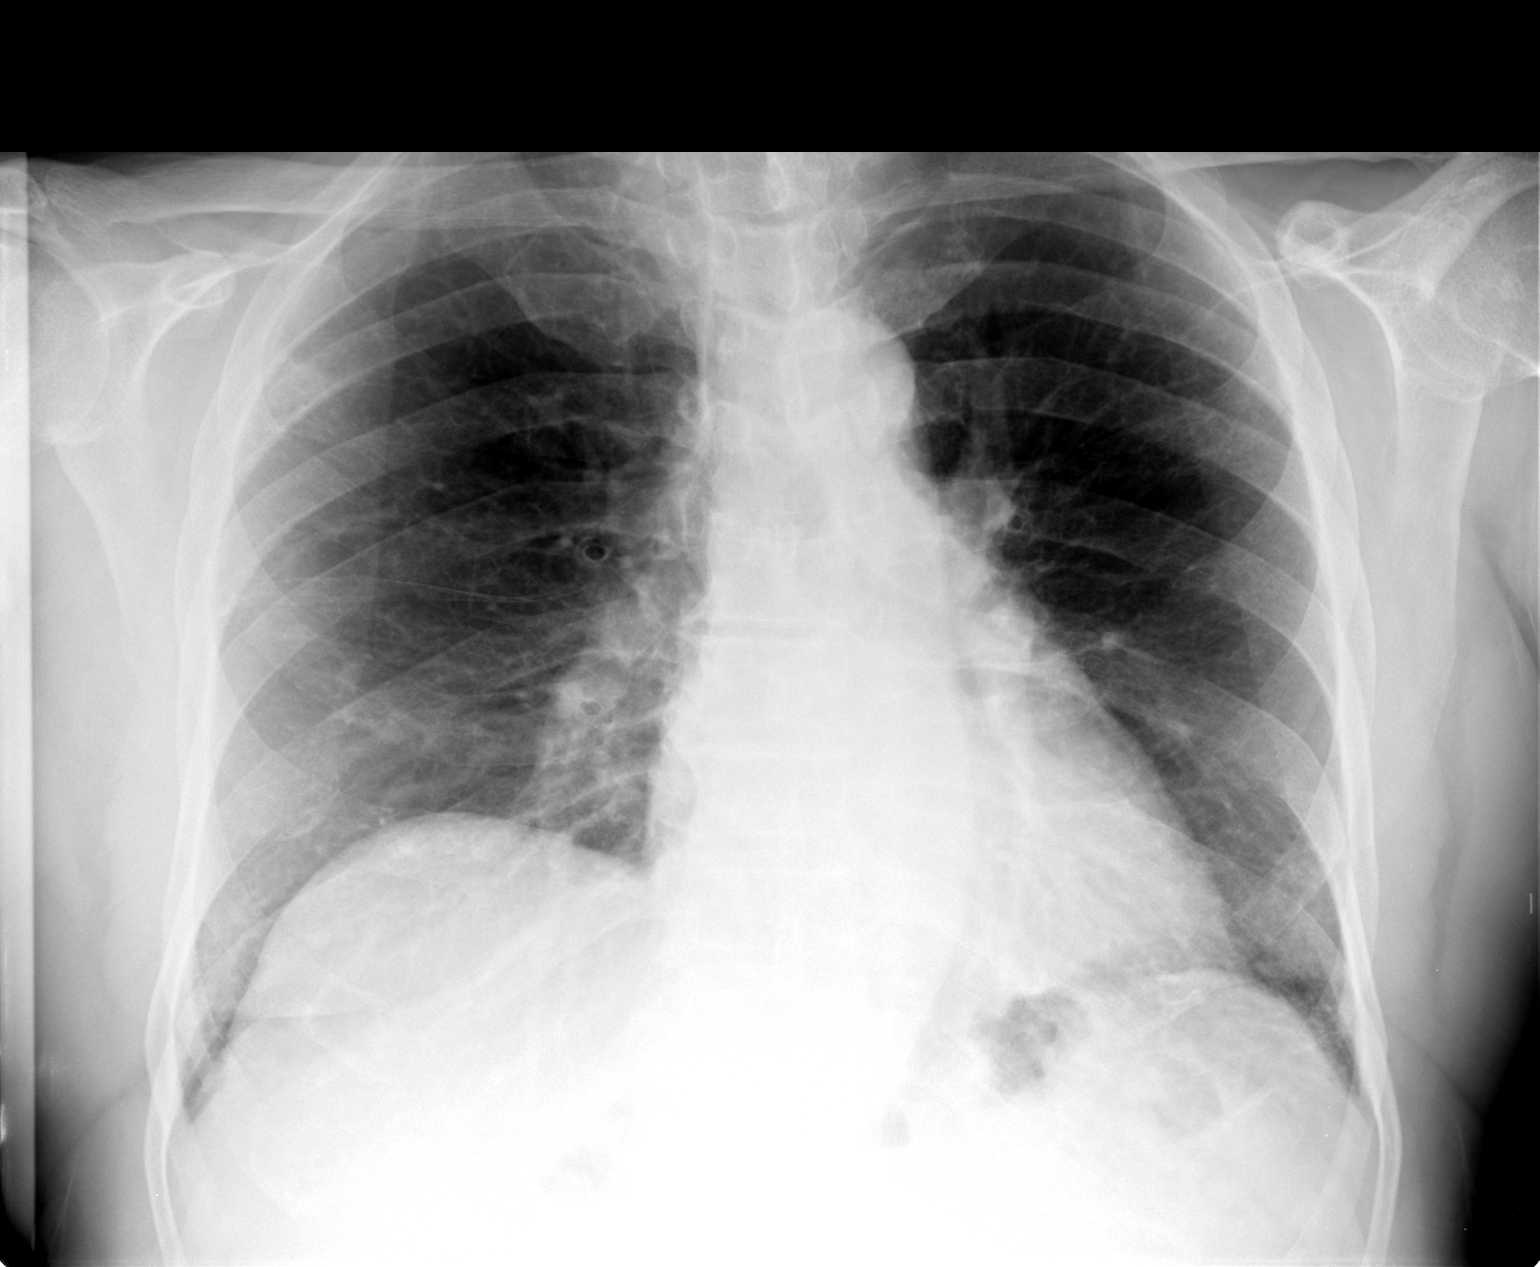

[view not recorded (2 of 2)]
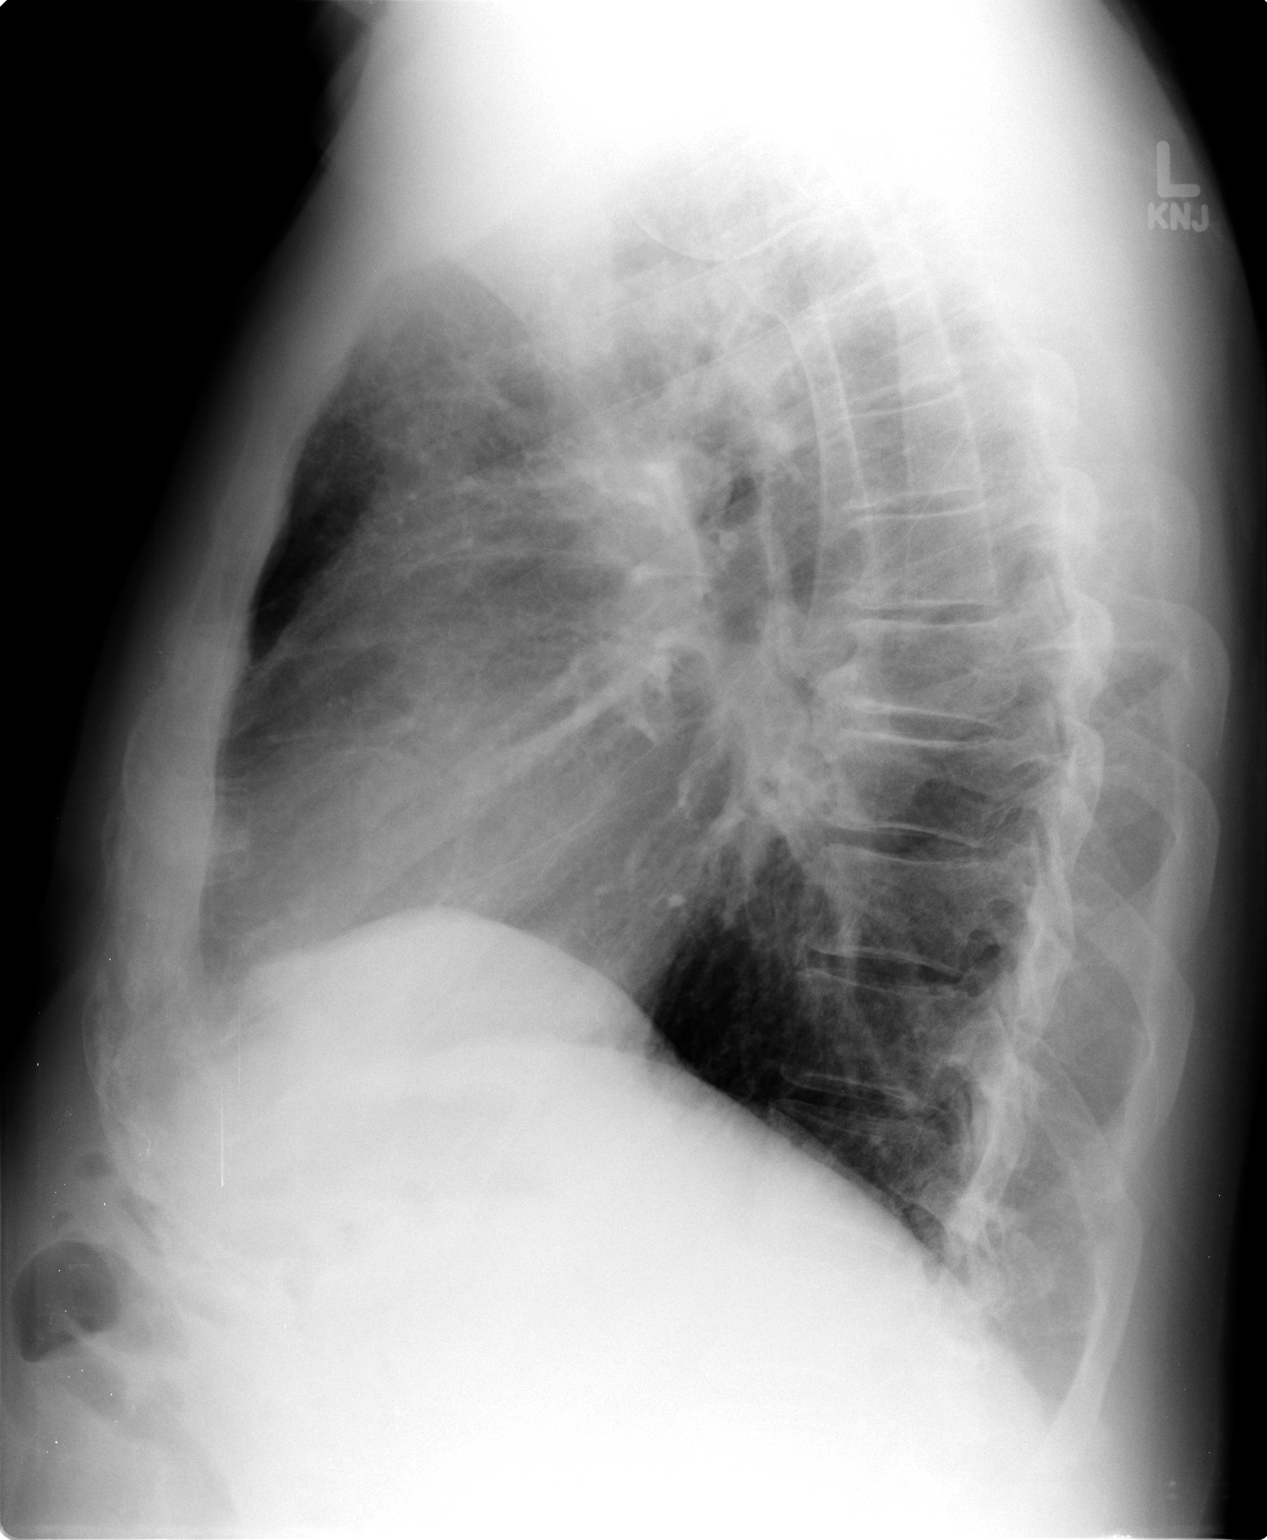

[2 of 2 positions shown; findings below may reference images not displayed]

FINDINGS: Chronic interstitial markings.  No pleural effusion or
pneumothorax.

Cardiomediastinal silhouette is within normal limits.

Degenerative changes of the visualized thoracolumbar spine.
IMPRESSION: No evidence of acute cardiopulmonary disease.

## 2013-12-04 ENCOUNTER — Telehealth: Payer: Self-pay | Admitting: Family Medicine

## 2013-12-04 NOTE — Telephone Encounter (Signed)
Received request from insurance to change amitiza to linzess. This is ok to do, equivalent med, once daily dosing may be more convenient. plz ensure this is ok with patient. If so, may remove amitiza from med list and send linzess 127m daily #[id number]

## 2013-12-05 ENCOUNTER — Telehealth: Payer: Self-pay | Admitting: *Deleted

## 2013-12-05 ENCOUNTER — Other Ambulatory Visit: Payer: Self-pay | Admitting: *Deleted

## 2013-12-05 MED ORDER — LINACLOTIDE 145 MCG PO CAPS: 145.0000 ug | ORAL_CAPSULE | Freq: Every day | ORAL | Status: DC

## 2013-12-05 NOTE — Telephone Encounter (Signed)
Patient agreeable to change. He received same notification. Linzess sent to [facility] as directed. Amitiza removed from med list.

## 2013-12-05 NOTE — Telephone Encounter (Signed)
PA required for Linzess. Form in your IN box for completion.

## 2013-12-06 NOTE — Telephone Encounter (Signed)
filled and placed in [name]'s box.

## 2013-12-06 NOTE — Telephone Encounter (Signed)
Form faxed. Will await determination.

## 2013-12-07 NOTE — Telephone Encounter (Signed)
Additional paperwork in your IN box for completion.

## 2013-12-07 NOTE — Telephone Encounter (Signed)
Form faxed.

## 2013-12-07 NOTE — Telephone Encounter (Signed)
Filled and placed in [name]'s box.

## 2013-12-08 NOTE — Telephone Encounter (Addendum)
Linzess approved. Pharmacy notified.

## 2014-01-03 ENCOUNTER — Other Ambulatory Visit: Payer: Self-pay | Admitting: Pulmonary Disease

## 2014-01-03 ENCOUNTER — Telehealth: Payer: Self-pay | Admitting: Pulmonary Disease

## 2014-01-03 DIAGNOSIS — G4733 Obstructive sleep apnea (adult) (pediatric): Secondary | ICD-10-CM

## 2014-01-03 NOTE — Telephone Encounter (Signed)
Spoke with pt. States that he is in need of a new CPAP machine. He is eligible for a new none. [facility] told him that they would be in contact with [place] about this order. We have not received anything. I am going to send an order over to [place] to get this process started. He agreed and verbalized understanding.

## 2014-01-05 ENCOUNTER — Telehealth: Payer: Self-pay | Admitting: Pulmonary Disease

## 2014-01-05 ENCOUNTER — Ambulatory Visit: Payer: Medicare Other

## 2014-01-05 NOTE — Telephone Encounter (Signed)
Pt is also calling back about needing a up to date appt to get his cpap [facility] is giving them a hard time  [place] Wife

## 2014-01-05 NOTE — Telephone Encounter (Signed)
Spoke with pt's wife, she states that pt needs a rov for a flu shot and a face to face visit for his cpap to be covered per [facility].  Pt would prefer for this to be done asap.  [place] has 3 open appt times on Monday and these are the only openings until the new year for [facility].  Pt's wife is going to call pt at work and see what time will work best for him.  Pt only needs to be scheduled an appt when they call back.  Will await this call back.

## 2014-01-06 NOTE — Telephone Encounter (Signed)
Spoke with the pt's spouse  She states that no appt is needed  He had his flu shot at another clinic  Will call back later to schedule face to face at his convenience per spouse

## 2014-01-09 ENCOUNTER — Encounter: Payer: Self-pay | Admitting: Pulmonary Disease

## 2014-01-09 ENCOUNTER — Ambulatory Visit (INDEPENDENT_AMBULATORY_CARE_PROVIDER_SITE_OTHER): Payer: Medicare Other | Admitting: Pulmonary Disease

## 2014-01-09 VITALS — BP 142/74 | HR 84 | Temp 97.8°F | Ht 73.0 in | Wt 238.4 lb

## 2014-01-09 DIAGNOSIS — G4733 Obstructive sleep apnea (adult) (pediatric): Secondary | ICD-10-CM

## 2014-01-09 DIAGNOSIS — Z23 Encounter for immunization: Secondary | ICD-10-CM

## 2014-01-09 NOTE — Addendum Note (Signed)
Addended by: Inge Rise on: 01/09/2014 11:23 AM   Modules accepted: Orders

## 2014-01-09 NOTE — Progress Notes (Signed)
   Subjective:    Patient ID: Justin Hester, male    DOB: Oct 04, 1930, 80 y.o.   MRN: [id number]  HPI The patient comes in today for follow-up of his obstructive sleep apnea. He is wearing C Pap compliantly, and is having no issues with his device. He is here only because his home care company required him to have a face-to-face prior to getting a new machine. He was just seen 6 months ago, where his machine was ordered, and he still does not have his new device.   Review of Systems  Constitutional: Negative for fever and unexpected weight change.  HENT: Negative for congestion, dental problem, ear pain, nosebleeds, postnasal drip, rhinorrhea, sinus pressure, sneezing, sore throat and trouble swallowing.   Eyes: Negative for redness and itching.  Respiratory: Negative for cough, chest tightness, shortness of breath and wheezing.   Cardiovascular: Negative for palpitations and leg swelling.  Gastrointestinal: Negative for nausea and vomiting.  Genitourinary: Negative for dysuria.  Musculoskeletal: Negative for joint swelling.  Skin: Negative for rash.  Neurological: Negative for headaches.  Hematological: Does not bruise/bleed easily.  Psychiatric/Behavioral: Negative for dysphoric mood. The patient is not nervous/anxious.        Objective:   Physical Exam Well-developed male in no acute distress Nose without purulence or discharge noted No skin breakdown or pressure necrosis from the C Pap mask Neck without lymphadenopathy or thyromegaly Lower extremities without edema, no cyanosis Alert, does not appear to be sleepy, moves all 4 extremities.       Assessment & Plan:

## 2014-01-09 NOTE — Patient Instructions (Signed)
Will give you the flu shot today. Will send an order to [facility] to get your machine asap.  If they are unwilling to do this, will send to a different company. followup with me as scheduled next year.

## 2014-01-09 NOTE — Assessment & Plan Note (Signed)
The patient has known severe obstructive sleep apnea, and is overdue for a new C Pap device. For some reason, his medical equipment company has not provided him with a new device, and now they say he had to have a six-month face-to-face in order to get the device 6 months later from when I ordered it initially. He has done very well with C Pap, and has been compliant. I will order a new device again for him.

## 2014-04-04 DIAGNOSIS — Z961 Presence of intraocular lens: Secondary | ICD-10-CM | POA: Diagnosis not present

## 2014-05-22 ENCOUNTER — Telehealth: Payer: Self-pay | Admitting: Family Medicine

## 2014-05-22 ENCOUNTER — Other Ambulatory Visit: Payer: Self-pay | Admitting: Family Medicine

## 2014-05-22 DIAGNOSIS — Z2839 Other underimmunization status: Secondary | ICD-10-CM

## 2014-05-22 DIAGNOSIS — Z283 Underimmunization status: Secondary | ICD-10-CM

## 2014-05-22 MED ORDER — LINACLOTIDE 145 MCG PO CAPS: 145.0000 ug | ORAL_CAPSULE | Freq: Every day | ORAL | Status: DC

## 2014-05-22 NOTE — Telephone Encounter (Signed)
Pt wife called in with questions regarding vaccines for traveling to [place] in June. He will be in [place] for over a month.  The vaccines that the pt's wife states are covered by the cdc are: mmr, tdap, varicella, polio, flu vacc. Also:   Hep A - for longer than a month  Japanese encephalitis - for longer than a moth Rabies  Call back at [phone], if you have any additional questions.  Pt has an appt in May for cpe, wife is hoping that this will be enough time to take care of all vacc concerns.  Pt will need proof of vaccines before traveling to [place].  Thanks.

## 2014-06-03 ENCOUNTER — Other Ambulatory Visit: Payer: Self-pay | Admitting: Family Medicine

## 2014-06-03 DIAGNOSIS — I10 Essential (primary) hypertension: Secondary | ICD-10-CM

## 2014-06-03 DIAGNOSIS — E039 Hypothyroidism, unspecified: Secondary | ICD-10-CM

## 2014-06-03 DIAGNOSIS — E7800 Pure hypercholesterolemia, unspecified: Secondary | ICD-10-CM

## 2014-06-03 DIAGNOSIS — Z125 Encounter for screening for malignant neoplasm of prostate: Secondary | ICD-10-CM

## 2014-06-03 DIAGNOSIS — D649 Anemia, unspecified: Secondary | ICD-10-CM

## 2014-06-03 NOTE — Telephone Encounter (Signed)
When in June is he leaving? We don't have japanese encephalitis available - may need to go to health dept travel clinic for this. I recommend starting hep A/B, already received Tdap and flu. Will discuss zostavax vs varivax.

## 2014-06-03 NOTE — Addendum Note (Signed)
Addended by: Ria Bush on: 06/03/2014 12:26 PM   Modules accepted: Orders

## 2014-06-06 ENCOUNTER — Other Ambulatory Visit (INDEPENDENT_AMBULATORY_CARE_PROVIDER_SITE_OTHER): Payer: Medicare Other

## 2014-06-06 DIAGNOSIS — E7800 Pure hypercholesterolemia, unspecified: Secondary | ICD-10-CM

## 2014-06-06 DIAGNOSIS — I10 Essential (primary) hypertension: Secondary | ICD-10-CM | POA: Diagnosis not present

## 2014-06-06 DIAGNOSIS — E039 Hypothyroidism, unspecified: Secondary | ICD-10-CM

## 2014-06-06 DIAGNOSIS — Z125 Encounter for screening for malignant neoplasm of prostate: Secondary | ICD-10-CM | POA: Diagnosis not present

## 2014-06-06 DIAGNOSIS — D649 Anemia, unspecified: Secondary | ICD-10-CM

## 2014-06-06 DIAGNOSIS — Z2839 Other underimmunization status: Secondary | ICD-10-CM

## 2014-06-06 DIAGNOSIS — Z283 Underimmunization status: Secondary | ICD-10-CM | POA: Diagnosis not present

## 2014-06-06 LAB — TSH: TSH: 3.47 u[IU]/mL (ref 0.35–4.50)

## 2014-06-06 LAB — BASIC METABOLIC PANEL
BUN: 20 mg/dL (ref 6–23)
CO2: 28 meq/L (ref 19–32)
CREATININE: 1.34 mg/dL (ref 0.40–1.50)
Calcium: 9 mg/dL (ref 8.4–10.5)
Chloride: 105 mEq/L (ref 96–112)
GFR: 65.7 mL/min (ref >60.00)
Glucose, Bld: 85 mg/dL (ref 70–99)
Potassium: 4.3 mEq/L (ref 3.5–5.1)
Sodium: 137 mEq/L (ref 135–145)

## 2014-06-06 LAB — LIPID PANEL
CHOL/HDL RATIO: 3
Cholesterol: 147 mg/dL (ref 0–200)
HDL: 43.9 mg/dL (ref >39.00)
LDL Cholesterol: 94 mg/dL (ref 0–99)
NonHDL: 103.1
Triglycerides: 47 mg/dL (ref 0.0–149.0)
VLDL: 9.4 mg/dL (ref 0.0–40.0)

## 2014-06-06 LAB — CBC WITH DIFFERENTIAL/PLATELET
Basophils Absolute: 0 10*3/uL (ref 0.0–0.1)
Basophils Relative: 0.3 % (ref 0.0–3.0)
EOS ABS: 0.1 10*3/uL (ref 0.0–0.7)
Eosinophils Relative: 1.9 % (ref 0.0–5.0)
HCT: 41.4 % (ref 39.0–52.0)
Hemoglobin: 13.9 g/dL (ref 13.0–17.0)
LYMPHS ABS: 2.5 10*3/uL (ref 0.7–4.0)
Lymphocytes Relative: 42.4 % (ref 12.0–46.0)
MCHC: 33.5 g/dL (ref 30.0–36.0)
MCV: 91 fl (ref 78.0–100.0)
MONO ABS: 0.8 10*3/uL (ref 0.1–1.0)
Monocytes Relative: 13.3 % — ABNORMAL HIGH (ref 3.0–12.0)
NEUTROS ABS: 2.5 10*3/uL (ref 1.4–7.7)
NEUTROS PCT: 42.1 % — AB (ref 43.0–77.0)
PLATELETS: 225 10*3/uL (ref 150.0–400.0)
RBC: 4.55 Mil/uL (ref 4.22–5.81)
RDW: 13.8 % (ref 11.5–15.5)
WBC: 5.9 10*3/uL (ref 4.0–10.5)

## 2014-06-06 LAB — PSA, MEDICARE: PSA: 1.44 ng/ml (ref 0.10–4.00)

## 2014-06-06 NOTE — Telephone Encounter (Signed)
Pt leaving Jun.28th. Pt notified of MD recommendations .

## 2014-06-07 LAB — VARICELLA ZOSTER ANTIBODY, IGG: Varicella IgG: 2870 Index — ABNORMAL HIGH (ref <135.00)

## 2014-06-07 LAB — HEPATITIS B SURFACE ANTIBODY,QUALITATIVE: Hep B S Ab: NEGATIVE

## 2014-06-09 ENCOUNTER — Ambulatory Visit (INDEPENDENT_AMBULATORY_CARE_PROVIDER_SITE_OTHER): Payer: Medicare Other | Admitting: Family Medicine

## 2014-06-09 ENCOUNTER — Encounter: Payer: Self-pay | Admitting: Family Medicine

## 2014-06-09 VITALS — BP 122/64 | HR 75 | Temp 98.0°F | Ht 71.0 in | Wt 226.2 lb

## 2014-06-09 DIAGNOSIS — Z7189 Other specified counseling: Secondary | ICD-10-CM | POA: Diagnosis not present

## 2014-06-09 DIAGNOSIS — K5904 Chronic idiopathic constipation: Secondary | ICD-10-CM

## 2014-06-09 DIAGNOSIS — Z7185 Encounter for immunization safety counseling: Secondary | ICD-10-CM | POA: Insufficient documentation

## 2014-06-09 DIAGNOSIS — Z0000 Encounter for general adult medical examination without abnormal findings: Secondary | ICD-10-CM | POA: Diagnosis not present

## 2014-06-09 DIAGNOSIS — I10 Essential (primary) hypertension: Secondary | ICD-10-CM

## 2014-06-09 DIAGNOSIS — G4733 Obstructive sleep apnea (adult) (pediatric): Secondary | ICD-10-CM

## 2014-06-09 DIAGNOSIS — K219 Gastro-esophageal reflux disease without esophagitis: Secondary | ICD-10-CM

## 2014-06-09 DIAGNOSIS — E7800 Pure hypercholesterolemia, unspecified: Secondary | ICD-10-CM

## 2014-06-09 DIAGNOSIS — Z23 Encounter for immunization: Secondary | ICD-10-CM

## 2014-06-09 DIAGNOSIS — K5900 Constipation, unspecified: Secondary | ICD-10-CM

## 2014-06-09 DIAGNOSIS — E039 Hypothyroidism, unspecified: Secondary | ICD-10-CM

## 2014-06-09 MED ORDER — LUBIPROSTONE 24 MCG PO CAPS: 24.0000 ug | ORAL_CAPSULE | Freq: Two times a day (BID) | ORAL | Status: DC | PRN

## 2014-06-09 MED ORDER — LEVOTHYROXINE SODIUM 50 MCG PO TABS: ORAL_TABLET | ORAL | Status: DC

## 2014-06-09 MED ORDER — LOSARTAN POTASSIUM 100 MG PO TABS: 100.0000 mg | ORAL_TABLET | Freq: Every day | ORAL | Status: DC

## 2014-06-09 MED ORDER — AMLODIPINE BESYLATE 10 MG PO TABS: 10.0000 mg | ORAL_TABLET | Freq: Every day | ORAL | Status: DC

## 2014-06-09 MED ORDER — FUROSEMIDE 40 MG PO TABS: 40.0000 mg | ORAL_TABLET | Freq: Every day | ORAL | Status: DC

## 2014-06-09 NOTE — Progress Notes (Signed)
Pre visit review using our clinic review tool, if applicable. No additional management support is needed unless otherwise documented below in the visit note.

## 2014-06-09 NOTE — Assessment & Plan Note (Addendum)
Advanced directives: has set up - wife is HCPOA then oldest daughter. Suggested he bring me a copy. Wife somewhat hesitant to bring [place] pt's advanced directive, states she would come with him to hospital if needed.

## 2014-06-09 NOTE — Assessment & Plan Note (Signed)
I have personally reviewed the Medicare Annual Wellness questionnaire and have noted 1. The patient's medical and social history 2. Their use of alcohol, tobacco or illicit drugs 3. Their current medications and supplements 4. The patient's functional ability including ADL's, fall risks, home safety risks and hearing or visual impairment. 5. Diet and physical activity 6. Evidence for depression or mood disorders The patients weight, height, BMI have been recorded in the chart.  Hearing and vision has been addressed. I have made referrals, counseling and provided education to the patient based review of the above and I have provided the pt with a written personalized care plan for preventive services. Provider list updated - see scanned questionairre. Reviewed preventative protocols and updated unless pt declined.

## 2014-06-09 NOTE — Patient Instructions (Addendum)
[facility] j[facility]ese encephalitis [facility].  Bring me copy of advanced directive. Hep A/B first shot today. I've refilled amitiza. Return as needed or in 1 year for next medicare wellness visit. Have a safe trip to [place]!

## 2014-06-09 NOTE — Progress Notes (Signed)
BP 122/64 mmHg  Pulse 75  Temp(Src) 98 F (36.7 C) (Oral)  Ht _0  (1.803 m)  Wt 226 lb 4 oz (102.626 kg)  BMI 31.57 kg/m2  SpO2 97%   CC: medicare wellness visit  Subjective:    Patient ID: Justin Hester, male    DOB: [birth year], 81 y.o.   MRN: [id number]  HPI: Eliazer Hemphill is a 81 y.o. male presenting on 06/09/2014 for Annual Exam and Immunizations   Upcoming trip to [place] >26monthduration. See recent phone note. I recommend Hep A/B and zostavax - zostavax too expensive. [place]   [place] screen passed today (some chronic L loss) Vision screen at eye doctor 03/2014 Denies falls, depression/anhedonia.  Preventative: Colonoscopy 2012 mild diverticulosis o/w WNL ([name] Prostate cancer screening - after extensive discussion of pros/cons of continued screening at advanced age, pt/wife desire to continue screening for prostate cancer Flu 2015 Tdap 2014 Pneumovax - done. prevnar done.  zostavax - too expensive. Advanced directives: has set up - wife is HCPOA then oldest daughter. Suggested he bring me a copy. Wife somewhat hesitant to this.  Lives with wife. Activity: Exercises 1 mile a day, plays golf Diet: some water, lots of sweetened beverages, fruits/vegetables daily  Relevant past medical, surgical, family and social history reviewed and updated as indicated. Interim medical history since our last visit reviewed. Allergies and medications reviewed and updated. Current Outpatient Prescriptions on File Prior to Visit  Medication Sig  . amoxicillin (AMOXIL) 500 MG capsule Take 500 mg by mouth 3 (three) times daily. Prior to dental exam  . aspirin 81 MG tablet Take by mouth daily.    . Desoximetasone 02.19% GEL 1 application daily.  . magnesium hydroxide (MILK OF MAGNESIA) 800 MG/5ML suspension Take 15 mLs by mouth 2 (two) times daily as needed for constipation.  . naproxen (NAPROSYN) 250 MG tablet Take 250 mg by mouth  daily as needed.   . polyethylene glycol (MIRALAX / GLYCOLAX) packet Take 17 g by mouth daily.    . [DISCONTINUED] losartan-hydrochlorothiazide (HYZAAR) 100-25 MG per tablet Take 1 tablet by mouth daily.   No current facility-administered medications on file prior to visit.    Review of Systems Per HPI unless specifically indicated above     Objective:    BP 122/64 mmHg  Pulse 75  Temp(Src) 98 F (36.7 C) (Oral)  Ht _1  (1.803 m)  Wt 226 lb 4 oz (102.626 kg)  BMI 31.57 kg/m2  SpO2 97%  Wt Readings from Last 3 Encounters:  06/09/14 226 lb 4 oz (102.626 kg)  01/09/14 238 lb 6.4 oz (108.138 kg)  06/27/13 226 lb 6.4 oz (102.694 kg)    Physical Exam  Constitutional: He is oriented to person, place, and time. He appears well-developed and well-nourished. No distress.  HENT:  Head: Normocephalic and atraumatic.  Right Ear: Hearing, tympanic membrane, external ear and ear canal normal.  Left Ear: Hearing, tympanic membrane, external ear and ear canal normal.  Nose: Nose normal.  Mouth/Throat: Uvula is midline, oropharynx is clear and moist and mucous membranes are normal. No oropharyngeal exudate, posterior oropharyngeal edema or posterior oropharyngeal erythema.  Eyes: Conjunctivae and EOM are normal. Pupils are equal, round, and reactive to light. No scleral icterus.  Neck: Normal range of motion. Neck supple. Carotid bruit is not present. No thyromegaly present.  Cardiovascular: Normal rate, regular rhythm, normal heart sounds and intact distal pulses.   No murmur heard. Pulses:  Radial pulses are 2+ on the right side, and 2+ on the left side.  Pulmonary/Chest: Effort normal and breath sounds normal. No respiratory distress. He has no wheezes. He has no rales.  Abdominal: Soft. Bowel sounds are normal. He exhibits no distension and no mass. There is no tenderness. There is no rebound and no guarding.  Genitourinary: Rectum normal and prostate normal. Rectal exam shows no  external hemorrhoid, no internal hemorrhoid, no fissure, no mass, no tenderness and anal tone normal. Prostate is not enlarged (15gm) and not tender.  Musculoskeletal: Normal range of motion. He exhibits no edema.  Lymphadenopathy:    He has no cervical adenopathy.  Neurological: He is alert and oriented to person, place, and time.  CN grossly intact, station and gait intact Recall 1/3, 2/3 with cue Calculation 5/5 serial 3s  Skin: Skin is warm and dry. No rash noted.  Psychiatric: He has a normal mood and affect. His behavior is normal. Judgment and thought content normal.  Nursing note and vitals reviewed.  Results for orders placed or performed in visit on 06/06/14  PSA, Medicare  Result Value Ref Range   PSA 1.44 0.10 - 4.00 ng/ml  CBC with Differential/Platelet  Result Value Ref Range   WBC 5.9 4.0 - 10.5 K/uL   RBC 4.55 4.22 - 5.81 Mil/uL   Hemoglobin 13.9 13.0 - 17.0 g/dL   HCT 41.4 39.0 - 52.0 %   MCV 91.0 78.0 - 100.0 fl   MCHC 33.5 30.0 - 36.0 g/dL   RDW 13.8 11.5 - 15.5 %   Platelets 225.0 150.0 - 400.0 K/uL   Neutrophils Relative % 42.1 (L) 43.0 - 77.0 %   Lymphocytes Relative 42.4 12.0 - 46.0 %   Monocytes Relative 13.3 (H) 3.0 - 12.0 %   Eosinophils Relative 1.9 0.0 - 5.0 %   Basophils Relative 0.3 0.0 - 3.0 %   Neutro Abs 2.5 1.4 - 7.7 K/uL   Lymphs Abs 2.5 0.7 - 4.0 K/uL   Monocytes Absolute 0.8 0.1 - 1.0 K/uL   Eosinophils Absolute 0.1 0.0 - 0.7 K/uL   Basophils Absolute 0.0 0.0 - 0.1 K/uL  Basic metabolic panel  Result Value Ref Range   Sodium 137 135 - 145 mEq/L   Potassium 4.3 3.5 - 5.1 mEq/L   Chloride 105 96 - 112 mEq/L   CO2 28 19 - 32 mEq/L   Glucose, Bld 85 70 - 99 mg/dL   BUN 20 6 - 23 mg/dL   Creatinine, Ser 1.34 0.40 - 1.50 mg/dL   Calcium 9.0 8.4 - 10.5 mg/dL   GFR 65.70 >60.00 mL/min  Lipid panel  Result Value Ref Range   Cholesterol 147 0 - 200 mg/dL   Triglycerides 47.0 0.0 - 149.0 mg/dL   HDL 43.90 >39.00 mg/dL   VLDL 9.4 0.0 - 40.0  mg/dL   LDL Cholesterol 94 0 - 99 mg/dL   Total CHOL/HDL Ratio 3    NonHDL 103.10   TSH  Result Value Ref Range   TSH 3.47 0.35 - 4.50 uIU/mL  Hepatitis B surface antibody  Result Value Ref Range   Hep B S Ab NEG NEGATIVE  Varicella zoster antibody, IgG  Result Value Ref Range   Varicella IgG 2870.00 (H) <135.00 Index      Assessment & Plan:   Problem List Items Addressed This Visit    Advanced care planning/counseling discussion    Advanced directives: has set up - wife is HCPOA then oldest daughter. Suggested  he bring me a copy. Wife somewhat hesitant to bring [place] pt's advanced directive, states she would come with him to hospital if needed.      Chronic idiopathic constipation    Stable on miralax daily and prn amitiza use. refilled today..      Essential hypertension    Chronic, stable. Continue amlodipine and lasix.      Relevant Medications   amLODipine (NORVASC) 10 MG tablet   furosemide (LASIX) 40 MG tablet   losartan (COZAAR) 100 MG tablet   RESOLVED: GERD    Stable off meds. Will remove from active  Problem list.      Relevant Medications   lubiprostone (AMITIZA) 24 MCG capsule   HYPERCHOLESTEROLEMIA    Reviewed #s with patient. Chronic, stable off medication.       Relevant Medications   amLODipine (NORVASC) 10 MG tablet   furosemide (LASIX) 40 MG tablet   losartan (COZAAR) 100 MG tablet   Hypothyroidism    Chronic, stable. Continue regimen.      Relevant Medications   levothyroxine (SYNTHROID, LEVOTHROID) 50 MCG tablet   Immunization counseling    Discussed upcoming trip to [place] for 1 month - pt will receive japanese encephalitis vaccine at [place] as well as possibly shingles vaccine (too expensive otherwise). Reviewed immunity to chicken pox by recent titer. Discussed MMR and polio vaccinations and how over age 60 guidelines do not make any recommendation. Pt unsure if he has had measles mumps or rubella. Suggested we hold off on this vaccination for  now. Discussed Hep A/B. Not immune to Hep A based on recent titer. Will provide with first Hep A/B vaccine, and have him return in 1 mo prior to trip for 2nd vaccination. Discussed rabies - rec against this. UTD tetanus and pneumococcal and flu. Pt agrees with plan.      Relevant Orders   Hepatitis A hepatitis B combined vaccine IM (Completed)   Medicare annual wellness visit, subsequent - Primary    I have personally reviewed the Medicare Annual Wellness questionnaire and have noted 1. The patient's medical and social history 2. Their use of alcohol, tobacco or illicit drugs 3. Their current medications and supplements 4. The patient's functional ability including ADL's, fall risks, home safety risks and hearing or visual impairment. 5. Diet and physical activity 6. Evidence for depression or mood disorders The patients weight, height, BMI have been recorded in the chart.  Hearing and vision has been addressed. I have made referrals, counseling and provided education to the patient based review of the above and I have provided the pt with a written personalized care plan for preventive services. Provider list updated - see scanned questionairre. Reviewed preventative protocols and updated unless pt declined.       Obstructive sleep apnea    continue CPAP, followed by pulm.          Follow up plan: Return in about 1 year (around 06/09/2015), or as needed, for medicare wellness.

## 2014-06-09 NOTE — Assessment & Plan Note (Signed)
Resolved

## 2014-06-10 ENCOUNTER — Encounter: Payer: Self-pay | Admitting: Family Medicine

## 2014-06-10 NOTE — Assessment & Plan Note (Signed)
Chronic, stable. Continue regimen.

## 2014-06-10 NOTE — Assessment & Plan Note (Signed)
Stable on miralax daily and prn amitiza use. refilled today.Marland Kitchen

## 2014-06-10 NOTE — Assessment & Plan Note (Signed)
Stable off meds. Will remove from active  Problem list.

## 2014-06-10 NOTE — Assessment & Plan Note (Signed)
Reviewed #s with patient. Chronic, stable off medication.

## 2014-06-10 NOTE — Assessment & Plan Note (Signed)
Chronic, stable. Continue amlodipine and lasix.

## 2014-06-10 NOTE — Assessment & Plan Note (Signed)
continue CPAP, followed by pulm.

## 2014-06-10 NOTE — Addendum Note (Signed)
Addended by: Ria Bush on: 06/10/2014 11:28 AM   Modules accepted: Level of Service, SmartSet

## 2014-06-10 NOTE — Assessment & Plan Note (Signed)
Discussed upcoming trip to [place] for 1 month - pt will receive japanese encephalitis vaccine at [place] as well as possibly shingles vaccine (too expensive otherwise). Reviewed immunity to chicken pox by recent titer. Discussed MMR and polio vaccinations and how over age 79 guidelines do not make any recommendation. Pt unsure if he has had measles mumps or rubella. Suggested we hold off on this vaccination for now. Discussed Hep A/B. Not immune to Hep A based on recent titer. Will provide with first Hep A/B vaccine, and have him return in 1 mo prior to trip for 2nd vaccination. Discussed rabies - rec against this. UTD tetanus and pneumococcal and flu. Pt agrees with plan.

## 2014-06-13 DIAGNOSIS — H02053 Trichiasis without entropian right eye, unspecified eyelid: Secondary | ICD-10-CM | POA: Diagnosis not present

## 2014-06-13 DIAGNOSIS — H53431 Sector or arcuate defects, right eye: Secondary | ICD-10-CM | POA: Diagnosis not present

## 2014-06-15 DIAGNOSIS — H43813 Vitreous degeneration, bilateral: Secondary | ICD-10-CM | POA: Diagnosis not present

## 2014-06-23 ENCOUNTER — Ambulatory Visit: Payer: Medicare Other | Admitting: Internal Medicine

## 2014-06-28 ENCOUNTER — Encounter: Payer: Self-pay | Admitting: Pulmonary Disease

## 2014-06-28 ENCOUNTER — Ambulatory Visit (INDEPENDENT_AMBULATORY_CARE_PROVIDER_SITE_OTHER): Payer: Medicare Other | Admitting: Pulmonary Disease

## 2014-06-28 VITALS — BP 124/62 | HR 80 | Temp 97.1°F | Ht 73.0 in | Wt 229.6 lb

## 2014-06-28 DIAGNOSIS — G4733 Obstructive sleep apnea (adult) (pediatric): Secondary | ICD-10-CM

## 2014-06-28 NOTE — Patient Instructions (Signed)
Continue on cpap, and keep up with mask cushions and supplies. Keep working on weight loss followup with Dr. Halford Chessman in one year.

## 2014-06-28 NOTE — Assessment & Plan Note (Signed)
The patient continues to do very well on C Pap, and is satisfied with his sleep and daytime alertness. His download shows good compliance and no significant mask leak or breakthrough apnea. I have asked him to continue on his device, and to keep up with his mask changes and supplies. I've also encouraged him to work on weight loss.

## 2014-06-28 NOTE — Progress Notes (Signed)
   Subjective:    Patient ID: Justin Hester, male    DOB: 1930/07/18, 81 y.o.   MRN: [id number]  HPI The patient comes in today for follow-up of his obstructive sleep apnea. He is wearing C Pap compliantly, and is having no issues with his mask fit or pressure. His download shows excellent compliance and good control of his AHI. The patient is satisfied with his sleep and daytime alertness.   Review of Systems  Constitutional: Negative for fever and unexpected weight change.  HENT: Negative for congestion, dental problem, ear pain, nosebleeds, postnasal drip, rhinorrhea, sinus pressure, sneezing, sore throat and trouble swallowing.   Eyes: Negative for redness and itching.  Respiratory: Negative for cough, chest tightness, shortness of breath and wheezing.   Cardiovascular: Negative for palpitations and leg swelling.  Gastrointestinal: Negative for nausea and vomiting.  Genitourinary: Negative for dysuria.  Musculoskeletal: Negative for joint swelling.  Skin: Negative for rash.  Neurological: Negative for headaches.  Hematological: Does not bruise/bleed easily.  Psychiatric/Behavioral: Negative for dysphoric mood. The patient is not nervous/anxious.        Objective:   Physical Exam Well-developed male in no acute distress Nose without purulence or discharge noted Neck without lymphadenopathy or thyromegaly No skin breakdown or pressure necrosis from the C Pap mask Lower extremities with edema noted, no cyanosis Alert and oriented, moves all 4 extremities.       Assessment & Plan:

## 2014-07-11 ENCOUNTER — Ambulatory Visit (INDEPENDENT_AMBULATORY_CARE_PROVIDER_SITE_OTHER): Payer: Medicare Other

## 2014-07-11 DIAGNOSIS — Z23 Encounter for immunization: Secondary | ICD-10-CM

## 2014-07-26 ENCOUNTER — Telehealth: Payer: Self-pay

## 2014-07-26 NOTE — Telephone Encounter (Signed)
Ms Cervi will bring pts living will to the office by the end of the week to have it scanned in pts chart. Vaughan Basta T at front desk said to bring to front desk and info will be scanned.

## 2014-08-22 DIAGNOSIS — L219 Seborrheic dermatitis, unspecified: Secondary | ICD-10-CM | POA: Diagnosis not present

## 2014-08-31 ENCOUNTER — Encounter: Payer: Self-pay | Admitting: Family Medicine

## 2014-08-31 NOTE — Telephone Encounter (Signed)
pt is asking how to review his living will in [place]. It's under media section but I don't know how to access it via mychart?

## 2014-11-02 ENCOUNTER — Ambulatory Visit (INDEPENDENT_AMBULATORY_CARE_PROVIDER_SITE_OTHER): Payer: Medicare Other

## 2014-11-02 DIAGNOSIS — Z23 Encounter for immunization: Secondary | ICD-10-CM

## 2014-12-12 ENCOUNTER — Ambulatory Visit (INDEPENDENT_AMBULATORY_CARE_PROVIDER_SITE_OTHER): Payer: Medicare Other

## 2014-12-12 DIAGNOSIS — Z23 Encounter for immunization: Secondary | ICD-10-CM | POA: Diagnosis not present

## 2015-01-18 DIAGNOSIS — M170 Bilateral primary osteoarthritis of knee: Secondary | ICD-10-CM | POA: Diagnosis not present

## 2015-06-10 ENCOUNTER — Other Ambulatory Visit: Payer: Self-pay | Admitting: Family Medicine

## 2015-06-10 DIAGNOSIS — E039 Hypothyroidism, unspecified: Secondary | ICD-10-CM

## 2015-06-10 DIAGNOSIS — E7800 Pure hypercholesterolemia, unspecified: Secondary | ICD-10-CM

## 2015-06-10 DIAGNOSIS — I10 Essential (primary) hypertension: Secondary | ICD-10-CM

## 2015-06-10 DIAGNOSIS — Z1211 Encounter for screening for malignant neoplasm of colon: Secondary | ICD-10-CM

## 2015-06-12 ENCOUNTER — Telehealth: Payer: Self-pay | Admitting: Family Medicine

## 2015-06-12 ENCOUNTER — Other Ambulatory Visit (INDEPENDENT_AMBULATORY_CARE_PROVIDER_SITE_OTHER): Payer: Medicare Other

## 2015-06-12 ENCOUNTER — Ambulatory Visit (INDEPENDENT_AMBULATORY_CARE_PROVIDER_SITE_OTHER): Payer: Medicare Other

## 2015-06-12 VITALS — BP 118/72 | HR 91 | Temp 98.4°F | Ht 71.0 in | Wt 214.8 lb

## 2015-06-12 DIAGNOSIS — Z0000 Encounter for general adult medical examination without abnormal findings: Secondary | ICD-10-CM

## 2015-06-12 DIAGNOSIS — E7800 Pure hypercholesterolemia, unspecified: Secondary | ICD-10-CM

## 2015-06-12 DIAGNOSIS — E039 Hypothyroidism, unspecified: Secondary | ICD-10-CM

## 2015-06-12 DIAGNOSIS — Z1211 Encounter for screening for malignant neoplasm of colon: Secondary | ICD-10-CM

## 2015-06-12 DIAGNOSIS — I10 Essential (primary) hypertension: Secondary | ICD-10-CM | POA: Diagnosis not present

## 2015-06-12 LAB — BASIC METABOLIC PANEL
BUN: 16 mg/dL (ref 6–23)
CALCIUM: 8.9 mg/dL (ref 8.4–10.5)
CO2: 27 meq/L (ref 19–32)
Chloride: 108 mEq/L (ref 96–112)
Creatinine, Ser: 1.22 mg/dL (ref 0.40–1.50)
GFR: 73.03 mL/min (ref >60.00)
GLUCOSE: 101 mg/dL — AB (ref 70–99)
POTASSIUM: 4 meq/L (ref 3.5–5.1)
SODIUM: 140 meq/L (ref 135–145)

## 2015-06-12 LAB — LIPID PANEL
CHOLESTEROL: 148 mg/dL (ref 0–200)
HDL: 35.3 mg/dL — ABNORMAL LOW (ref >39.00)
LDL Cholesterol: 101 mg/dL — ABNORMAL HIGH (ref 0–99)
NonHDL: 112.26
Total CHOL/HDL Ratio: 4
Triglycerides: 55 mg/dL (ref 0.0–149.0)
VLDL: 11 mg/dL (ref 0.0–40.0)

## 2015-06-12 LAB — TSH: TSH: 5.23 u[IU]/mL — AB (ref 0.35–4.50)

## 2015-06-12 LAB — PSA, MEDICARE: PSA: 2.27 ng/ml (ref 0.10–4.00)

## 2015-06-12 NOTE — Patient Instructions (Signed)
Mr. Justin Hester , Thank you for taking time to come for your Medicare Wellness Visit. I appreciate your ongoing commitment to your health goals. Please review the following plan we discussed and let me know if I can assist you in the future.   These are the goals we discussed: Goals    . Increase physical activity     Starting 06/12/2015, I will continue to exercise for at least 60 min daily.        This is a list of the screening recommended for you and due dates:  Health Maintenance  Topic Date Due  . Flu Shot  08/21/2015  . Tetanus Vaccine  09/04/2022  . DTaP/Tdap/Td vaccine  Completed  . Shingles Vaccine  Completed  . Pneumonia vaccines  Completed    Preventive Care for Adults  A healthy lifestyle and preventive care can promote health and wellness. Preventive health guidelines for adults include the following key practices.  . A routine yearly physical is a good way to check with your health care provider about your health and preventive screening. It is a chance to share any concerns and updates on your health and to receive a thorough exam.  . Visit your dentist for a routine exam and preventive care every 6 months. Brush your teeth twice a day and floss once a day. Good oral hygiene prevents tooth decay and gum disease.  . The frequency of eye exams is based on your age, health, family medical history, use  of contact lenses, and other factors. Follow your health care provider's ecommendations for frequency of eye exams.  . Eat a healthy diet. Foods like vegetables, fruits, whole grains, low-fat dairy products, and lean protein foods contain the nutrients you need without too many calories. Decrease your intake of foods high in solid fats, added sugars, and salt. Eat the right amount of calories for you. Get information about a proper diet from your health care provider, if necessary.  . Regular physical exercise is one of the most important things you can do for your health.  Most adults should get at least 150 minutes of moderate-intensity exercise (any activity that increases your heart rate and causes you to sweat) each week. In addition, most adults need muscle-strengthening exercises on 2 or more days a week.  Silver Sneakers may be a benefit available to you. To determine eligibility, you may visit the website: www.silversneakers.com or contact program at 801-398-4272 Mon-Fri between 8AM-8PM.   . Maintain a healthy weight. The body mass index (BMI) is a screening tool to identify possible weight problems. It provides an estimate of body fat based on height and weight. Your health care provider can find your BMI and can help you achieve or maintain a healthy weight.   For adults 20 years and older: ? A BMI below 18.5 is considered underweight. ? A BMI of 18.5 to 24.9 is normal. ? A BMI of 25 to 29.9 is considered overweight. ? A BMI of 30 and above is considered obese.   . Maintain normal blood lipids and cholesterol levels by exercising and minimizing your intake of saturated fat. Eat a balanced diet with plenty of fruit and vegetables. Blood tests for lipids and cholesterol should begin at age 42 and be repeated every 5 years. If your lipid or cholesterol levels are high, you are over 50, or you are at high risk for heart disease, you may need your cholesterol levels checked more frequently. Ongoing high lipid and cholesterol levels should  be treated with medicines if diet and exercise are not working.  . If you smoke, find out from your health care provider how to quit. If you do not use tobacco, please do not start.  . If you choose to drink alcohol, please do not consume more than 2 drinks per day. One drink is considered to be 12 ounces (355 mL) of beer, 5 ounces (148 mL) of wine, or 1.5 ounces (44 mL) of liquor.  . If you are 19-20 years old, ask your health care provider if you should take aspirin to prevent strokes.  . Use sunscreen. Apply sunscreen  liberally and repeatedly throughout the day. You should seek shade when your shadow is shorter than you. Protect yourself by wearing long sleeves, pants, a wide-brimmed hat, and sunglasses year round, whenever you are outdoors.  . Once a month, do a whole body skin exam, using a mirror to look at the skin on your back. Tell your health care provider of new moles, moles that have irregular borders, moles that are larger than a pencil eraser, or moles that have changed in shape or color.

## 2015-06-12 NOTE — Progress Notes (Signed)
Pre visit review using our clinic review tool, if applicable. No additional management support is needed unless otherwise documented below in the visit note.

## 2015-06-12 NOTE — Progress Notes (Signed)
PCP notes:  Health maintenance: No gaps identified or addressed  Abnormal screenings:   Hearing - failed. Please assess at next appt.   Patient concerns: None  Nurse concerns: None  Next PCP appt: 06/14/15 @ 1000

## 2015-06-12 NOTE — Telephone Encounter (Signed)
Wife called - she wants to make sure that pt has been screened for hep c. When he was seen, the wife says that he used the word "booster" and wife wants to make sure. cb number is [phone] Thanks

## 2015-06-12 NOTE — Progress Notes (Signed)
Subjective:   Justin Hester is a 82 y.o. male who presents for Medicare Annual/Subsequent preventive examination.  Review of Systems:  N/A Cardiac Risk Factors include: advanced age (>72mn, >>27women);male gender;hypertension     Objective:    Vitals: BP 118/72 mmHg  Pulse 91  Temp(Src) 98.4 F (36.9 C) (Oral)  Ht _0  (1.803 m)  Wt 214 lb 12 oz (97.41 kg)  BMI 29.96 kg/m2  SpO2 95%  Body mass index is 29.96 kg/(m^2).  Tobacco History  Smoking status  . Never Smoker   Smokeless tobacco  . Never Used     Counseling given: No   Past Medical History  Diagnosis Date  . OSA on CPAP   . Hypertension   . DVT (deep venous thrombosis) (HCC) 1990s    6 mo coumadin  . Diverticulosis of colon (without mention of hemorrhage)     constipation - new problem 3/12 and work up in progress...  . DJD (degenerative joint disease)   . Lumbar back pain   . Anemia   . Cataract   . Chronic constipation   . Multiple pulmonary nodules 2006  . GERD (gastroesophageal reflux disease)    Past Surgical History  Procedure Laterality Date  . Cataract extraction, bilateral      by Dr. BRicki Miller . Total knee arthroplasty  left 9/06, right 10/07    Dr. DSharol Given . Colonoscopy  2012    diverticulosis o/w WNL ([name]   Family History  Problem Relation Age of Onset  . Heart disease Father   . Rheum arthritis Mother   . Rheum arthritis Father   . Cancer Neg Hx    History  Sexual Activity  . Sexual Activity: No    Outpatient Encounter Prescriptions as of 06/12/2015  Medication Sig  . amoxicillin (AMOXIL) 500 MG capsule Take 500 mg by mouth 3 (three) times daily. Prior to dental exam  . aspirin 81 MG tablet Take by mouth daily.    . Desoximetasone 03.40% GEL 1 application daily.  . furosemide (LASIX) 40 MG tablet Take 1 tablet (40 mg total) by mouth daily.  .Marland Kitchenlevothyroxine (SYNTHROID, LEVOTHROID) 50 MCG tablet TAKE 1 TABLET BY MOUTH EVERY MORNING 30 MINUTES BEFORE BREAKFAST.  .Marland Kitchen losartan (COZAAR) 100 MG tablet Take 1 tablet (100 mg total) by mouth daily.  .Marland Kitchenlubiprostone (AMITIZA) 24 MCG capsule Take 1 capsule (24 mcg total) by mouth 2 (two) times daily as needed for constipation.  . magnesium hydroxide (MILK OF MAGNESIA) 800 MG/5ML suspension Take 15 mLs by mouth 2 (two) times daily as needed for constipation.  . naproxen (NAPROSYN) 250 MG tablet Take 250 mg by mouth daily as needed.   . polyethylene glycol (MIRALAX / GLYCOLAX) packet Take 17 g by mouth as needed.   .Marland KitchenamLODipine (NORVASC) 10 MG tablet Take 1 tablet (10 mg total) by mouth daily.   No facility-administered encounter medications on file as of 06/12/2015.    Activities of Daily Living In your present state of health, do you have any difficulty performing the following activities: 06/12/2015  Hearing? N  Vision? N  Difficulty concentrating or making decisions? N  Walking or climbing stairs? N  Dressing or bathing? N  Doing errands, shopping? N  Preparing Food and eating ? N  Using the Toilet? N  In the past six months, have you accidently leaked urine? N  Do you have problems with loss of bowel control? N  Managing your Medications? N  Managing your Finances? N  Housekeeping or managing your Housekeeping? N    Patient Care Team: Ria Bush, MD as PCP - General (Family Medicine) Hurman Horn, MD as Consulting Physician (Ophthalmology) Marylynn Pearson, MD as Consulting Physician (Ophthalmology) Chesley Mires, MD as Consulting Physician (Pulmonary Disease) Devra Dopp, MD as Referring Physician (Dermatology)   Assessment:     Hearing Screening   125Hz 250Hz 500Hz 1000Hz 2000Hz 4000Hz 8000Hz  Right ear:   40 40 40 0   Left ear:   0 0 0 0   Vision Screening Comments: Last eye exam in May 2016 with Dr. Venetia Maxon   Exercise Activities and Dietary recommendations Current Exercise Habits: Home exercise routine, Type of exercise: walking (cycling, golf, yardwork), Time (Minutes): 60,  Frequency (Times/Week): 7, Weekly Exercise (Minutes/Week): 420, Intensity: Moderate, Exercise limited by: None identified  Goals    . Increase physical activity     Starting 06/12/2015, I will continue to exercise for at least 60 min daily.       Fall Risk Fall Risk  06/12/2015 06/09/2014 04/29/2013 04/27/2012  Falls in the past year? No No No No   Depression Screen PHQ 2/9 Scores 06/12/2015 06/09/2014 04/29/2013 04/27/2012  PHQ - 2 Score 0 0 0 0    Cognitive Testing MMSE - Mini Mental State Exam 06/12/2015  Orientation to time 5  Orientation to Place 5  Registration 3  Attention/ Calculation 0  Recall 3  Language- name 2 objects 0  Language- repeat 1  Language- follow 3 step command 3  Language- read & follow direction 0  Write a sentence 0  Copy design 0  Total score 20   PLEASE NOTE: A Mini-Cog screen was completed. Maximum score is 20. A value of 0 denotes this part of Folstein MMSE was not completed or the patient failed this part of the Mini-Cog screening.   Mini-Cog Screening Orientation to Time - Max 5 pts Orientation to Place - Max 5 pts Registration - Max 3 pts Recall - Max 3 pts Language Repeat - Max 1 pts Language Follow 3 Step Command - Max 3 pts   Immunization History  Administered Date(s) Administered  . Encephalitis 06/14/2014, 07/12/2014  . Hep A / Hep B 06/09/2014, 07/11/2014, 12/12/2014  . Influenza Split 11/04/2011  . Influenza, Seasonal, Injecte, Preservative Fre 01/09/2014  . Influenza,inj,Quad PF,36+ Mos 11/08/2012, 11/02/2014  . Pneumococcal Conjugate-13 04/29/2013  . Pneumococcal Polysaccharide-23 06/14/2014  . Tdap 09/03/2012  . Zoster 06/14/2014   Screening Tests Health Maintenance  Topic Date Due  . INFLUENZA VACCINE  08/21/2015  . TETANUS/TDAP  09/04/2022  . DTaP/Tdap/Td  Completed  . ZOSTAVAX  Completed  . PNA vac Low Risk Adult  Completed      Plan:     I have personally reviewed and addressed the Medicare Annual Wellness  questionnaire and have noted the following in the patient's chart:  A. Medical and social history B. Use of alcohol, tobacco or illicit drugs  C. Current medications and supplements D. Functional ability and status E.  Nutritional status F.  Physical activity G. Advance directives H. List of other physicians I.  Hospitalizations, surgeries, and ER visits in previous 12 months J.  [place] to include hearing, vision, cognitive, depression L. Referrals and appointments - none  In addition, I have reviewed and discussed with patient certain preventive protocols, quality metrics, and best practice recommendations. A written personalized care plan for preventive services as well as general preventive health recommendations  were provided to patient.  See attached scanned questionnaire for additional information.   Signed,   Lindell Noe, MHA, BS, LPN Health Advisor

## 2015-06-12 NOTE — Telephone Encounter (Signed)
Had blood work today. We did not check hep C - as patient does not fall in age range covered by insurance (born [phone]). We can add on if they are ok with covering lab if insurance declines it. Otherwise we can further discuss at office visit.

## 2015-06-13 NOTE — Progress Notes (Signed)
I reviewed health advisor's note, was available for consultation, and agree with documentation and plan.  

## 2015-06-13 NOTE — Telephone Encounter (Signed)
Spoke with patient's wife and advised that another lab would need to be drawn for testing. (The labs he had drawn were all sent to the [facility] lab and the Hep C test has to be sent to [facility] in a different tube). She said they had discussed and researched it further and decided to forego the testing at this time anyway and may decide to do it next year.

## 2015-06-14 ENCOUNTER — Ambulatory Visit: Payer: Medicare Other | Admitting: Family Medicine

## 2015-06-14 ENCOUNTER — Ambulatory Visit (INDEPENDENT_AMBULATORY_CARE_PROVIDER_SITE_OTHER): Payer: Medicare Other | Admitting: Family Medicine

## 2015-06-14 ENCOUNTER — Encounter: Payer: Self-pay | Admitting: Family Medicine

## 2015-06-14 VITALS — BP 136/82 | HR 68 | Temp 98.1°F | Wt 217.5 lb

## 2015-06-14 DIAGNOSIS — H9192 Unspecified hearing loss, left ear: Secondary | ICD-10-CM | POA: Insufficient documentation

## 2015-06-14 DIAGNOSIS — E7800 Pure hypercholesterolemia, unspecified: Secondary | ICD-10-CM

## 2015-06-14 DIAGNOSIS — K5904 Chronic idiopathic constipation: Secondary | ICD-10-CM | POA: Diagnosis not present

## 2015-06-14 DIAGNOSIS — E039 Hypothyroidism, unspecified: Secondary | ICD-10-CM

## 2015-06-14 DIAGNOSIS — I10 Essential (primary) hypertension: Secondary | ICD-10-CM

## 2015-06-14 MED ORDER — LOSARTAN POTASSIUM 100 MG PO TABS: 100.0000 mg | ORAL_TABLET | Freq: Every day | ORAL | Status: DC

## 2015-06-14 MED ORDER — AMLODIPINE BESYLATE 10 MG PO TABS: 10.0000 mg | ORAL_TABLET | Freq: Every day | ORAL | Status: DC

## 2015-06-14 MED ORDER — LEVOTHYROXINE SODIUM 50 MCG PO TABS: ORAL_TABLET | ORAL | Status: DC

## 2015-06-14 MED ORDER — FUROSEMIDE 40 MG PO TABS: 40.0000 mg | ORAL_TABLET | Freq: Every day | ORAL | Status: DC

## 2015-06-14 NOTE — Assessment & Plan Note (Signed)
Longstanding, progressive. R hearing remains. No cerumen in canals.

## 2015-06-14 NOTE — Patient Instructions (Signed)
Avoid added sugars as discussed today. For thyroid medicine - increase levothyroxine to 59mg daily, except for 2 days a week take 1028m (2 tablets). Return in 2 months for recheck thyroid function (lab visit only). Good to see you today, call us[place]oreaith questions. Return as needed or in 1 year for next visit.

## 2015-06-14 NOTE — Assessment & Plan Note (Signed)
Stable on daily miralax.

## 2015-06-14 NOTE — Progress Notes (Signed)
Pre visit review using our clinic review tool, if applicable. No additional management support is needed unless otherwise documented below in the visit note.

## 2015-06-14 NOTE — Assessment & Plan Note (Signed)
Chronic, stable. Reviewed #s with patient.

## 2015-06-14 NOTE — Assessment & Plan Note (Signed)
TSH slightly elevated, endorses mild cold intolerance. Will increase levothyroxine to 27mg daily, 1021m twice weekly, recheck TFTs in 2 months.

## 2015-06-14 NOTE — Progress Notes (Signed)
BP 136/82 mmHg  Pulse 68  Temp(Src) 98.1 F (36.7 C) (Oral)  Wt 217 lb 8 oz (98.657 kg)   CC: f/u visit  Subjective:    Patient ID: Justin Hester, male    DOB: 06-Mar-1930, 82 y.o.   MRN: [id number]  HPI: Justin Hester is a 82 y.o. male presenting on 06/14/2015 for Follow-up   Seen by Katha Cabal earlier this week for medicare wellness visit, note reviewed.   Failed hearing screen on left - known chronic L hearing loss. 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   OSA - followed by pulm Dr Gwenette Greet.   CIC - stable on miralax daily, rare prn amitiza.   HTN - Compliant with current antihypertensive regimen of amlodipine 75m and losartan 1014mdaily, as well as lasix 4075mDoes occasionally check blood pressures at home: well controlled. No low blood pressure readings or symptoms of dizziness/syncope. Denies HA, vision changes, CP/tightness, SOB, leg swelling.    Wife states PSA continues to be covered by medicare.   Relevant past medical, surgical, family and social history reviewed and updated as indicated. Interim medical history since our last visit reviewed. Allergies and medications reviewed and updated. Current Outpatient Prescriptions on File Prior to Visit  Medication Sig  . amoxicillin (AMOXIL) 500 MG capsule Take 500 mg by mouth 3 (three) times daily. Prior to dental exam  . aspirin 81 MG tablet Take by mouth daily.    . Desoximetasone 0.04.01GEL 1 application daily.  . lMarland Kitchenbiprostone (AMITIZA) 24 MCG capsule Take 1 capsule (24 mcg total) by mouth 2 (two) times daily as needed for constipation.  . magnesium hydroxide (MILK OF MAGNESIA) 800 MG/5ML suspension Take 15 mLs by mouth 2 (two) times daily as needed for constipation.  . naproxen (NAPROSYN) 250 MG tablet Take 250 mg by mouth daily as needed.   . polyethylene glycol (MIRALAX / GLYCOLAX) packet Take 17 g by mouth as needed.   . [DISCONTINUED] losartan-hydrochlorothiazide (HYZAAR) 100-25 MG per tablet Take 1 tablet  by mouth daily.   No current facility-administered medications on file prior to visit.    Review of Systems Per HPI unless specifically indicated in ROS section     Objective:    BP 136/82 mmHg  Pulse 68  Temp(Src) 98.1 F (36.7 C) (Oral)  Wt 217 lb 8 oz (98.657 kg)  Wt Readings from Last 3 Encounters:  06/14/15 217 lb 8 oz (98.657 kg)  06/12/15 214 lb 12 oz (97.41 kg)  06/28/14 229 lb 9.6 oz (104.146 kg)    Physical Exam  Constitutional: He appears well-developed and well-nourished. No distress.  HENT:  Mouth/Throat: Oropharynx is clear and moist. No oropharyngeal exudate.  Eyes: Conjunctivae and EOM are normal. Pupils are equal, round, and reactive to light.  Arcus senilis  Neck: Normal range of motion. Neck supple. Carotid bruit is not present. No thyromegaly present.  Cardiovascular: Normal rate, regular rhythm, normal heart sounds and intact distal pulses.   No murmur heard. Pulmonary/Chest: Effort normal and breath sounds normal. No respiratory distress. He has no wheezes. He has no rales.  Genitourinary:  DRE deferred  Musculoskeletal: He exhibits no edema.  Lymphadenopathy:    He has no cervical adenopathy.  Skin: Skin is warm and dry. No rash noted.  Psychiatric: He has a normal mood and affect.  Nursing note and vitals reviewed.  Results for orders placed or performed in visit on 06/12/15  Lipid panel  Result Value Ref Range   Cholesterol 148 0 -  200 mg/dL   Triglycerides 55.0 0.0 - 149.0 mg/dL   HDL 35.30 (L) >39.00 mg/dL   VLDL 11.0 0.0 - 40.0 mg/dL   LDL Cholesterol 101 (H) 0 - 99 mg/dL   Total CHOL/HDL Ratio 4    NonHDL 112.26   TSH  Result Value Ref Range   TSH 5.23 (H) 0.35 - 4.50 uIU/mL  Basic metabolic panel  Result Value Ref Range   Sodium 140 135 - 145 mEq/L   Potassium 4.0 3.5 - 5.1 mEq/L   Chloride 108 96 - 112 mEq/L   CO2 27 19 - 32 mEq/L   Glucose, Bld 101 (H) 70 - 99 mg/dL   BUN 16 6 - 23 mg/dL   Creatinine, Ser 1.22 0.40 - 1.50  mg/dL   Calcium 8.9 8.4 - 10.5 mg/dL   GFR 73.03 >60.00 mL/min  PSA, Medicare  Result Value Ref Range   PSA 2.27 0.10 - 4.00 ng/ml      Assessment & Plan:   Problem List Items Addressed This Visit    HYPERCHOLESTEROLEMIA - Primary    Chronic, stable. Reviewed #s with patient.      Relevant Medications   amLODipine (NORVASC) 10 MG tablet   furosemide (LASIX) 40 MG tablet   losartan (COZAAR) 100 MG tablet   Essential hypertension    Chronic, stable. Continue amlodipine, losartan, lasix regularly.       Relevant Medications   amLODipine (NORVASC) 10 MG tablet   furosemide (LASIX) 40 MG tablet   losartan (COZAAR) 100 MG tablet   Chronic idiopathic constipation    Stable on daily miralax.       Hypothyroidism    TSH slightly elevated, endorses mild cold intolerance. Will increase levothyroxine to 51mg daily, 1033m twice weekly, recheck TFTs in 2 months.       Relevant Medications   levothyroxine (SYNTHROID, LEVOTHROID) 50 MCG tablet   Other Relevant Orders   TSH   T4, free   Left ear hearing loss    Longstanding, progressive. R hearing remains. No cerumen in canals.          Follow up plan: Return in about 1 year (around 06/13/2016), or as needed, for follow up visit.  JaRia BushMD

## 2015-06-14 NOTE — Assessment & Plan Note (Signed)
Chronic, stable. Continue amlodipine, losartan, lasix regularly.

## 2015-06-15 ENCOUNTER — Ambulatory Visit: Payer: Medicare Other | Admitting: Family Medicine

## 2015-07-02 ENCOUNTER — Encounter: Payer: Self-pay | Admitting: Pulmonary Disease

## 2015-07-02 ENCOUNTER — Ambulatory Visit (INDEPENDENT_AMBULATORY_CARE_PROVIDER_SITE_OTHER): Payer: Medicare Other | Admitting: Pulmonary Disease

## 2015-07-02 VITALS — BP 140/78 | HR 67 | Ht 72.0 in | Wt 216.4 lb

## 2015-07-02 DIAGNOSIS — Z9989 Dependence on other enabling machines and devices: Principal | ICD-10-CM

## 2015-07-02 DIAGNOSIS — G4733 Obstructive sleep apnea (adult) (pediatric): Secondary | ICD-10-CM

## 2015-07-02 NOTE — Patient Instructions (Signed)
Will have [facility] change your CPAP to auto setting  Will get CPAP report 1 month after pressure change and call with results  Follow up in 1 year

## 2015-07-02 NOTE — Progress Notes (Signed)
Current Outpatient Prescriptions on File Prior to Visit  Medication Sig  . amLODipine (NORVASC) 10 MG tablet Take 1 tablet (10 mg total) by mouth daily.  Marland Kitchen amoxicillin (AMOXIL) 500 MG capsule Take 500 mg by mouth 3 (three) times daily. Prior to dental exam  . aspirin 81 MG tablet Take by mouth daily.    . Desoximetasone 5.17 % GEL 1 application daily.  . furosemide (LASIX) 40 MG tablet Take 1 tablet (40 mg total) by mouth daily.  Marland Kitchen levothyroxine (SYNTHROID, LEVOTHROID) 50 MCG tablet Take 1 tablet (64mg daily). 2 days a week Tues/Fri take 2 tablets (1058m daily)  . losartan (COZAAR) 100 MG tablet Take 1 tablet (100 mg total) by mouth daily.  . Marland Kitchenubiprostone (AMITIZA) 24 MCG capsule Take 1 capsule (24 mcg total) by mouth 2 (two) times daily as needed for constipation.  . magnesium hydroxide (MILK OF MAGNESIA) 800 MG/5ML suspension Take 15 mLs by mouth 2 (two) times daily as needed for constipation.  . naproxen (NAPROSYN) 250 MG tablet Take 250 mg by mouth daily as needed.   . polyethylene glycol (MIRALAX / GLYCOLAX) packet Take 17 g by mouth as needed.   . [DISCONTINUED] losartan-hydrochlorothiazide (HYZAAR) 100-25 MG per tablet Take 1 tablet by mouth daily.   No current facility-administered medications on file prior to visit.     Chief Complaint  Patient presents with  . Follow-up    [facility]: Wears nightly. Denies problems with mask or pressure. DME: [facility].      Tests PSG 06/17/08 >> AHI 50 CPAP 05/30/15 to 06/28/15 >> used on 30 of 30 nights with average 6 hrs 51 min.  Average AHI 1.6 with CPAP 14 cm H2O  Past medical hx HTN, DVT 1990's, DJD, Chronic back pain, Anemia, GERD, Diverticulosis, Hypothyroidism  Past surgical hx, Allergies, Family hx, Social hx all reviewed.  Vital Signs BP 140/78 mmHg  Pulse 67  Ht 6' (1.829 m)  Wt 216 lb 6.4 oz (98.158 kg)  BMI 29.34 kg/m2  SpO2 97%  History of Present Illness ClKylee Nardozzis a [age].o. male with obstructive sleep  apnea.  He has full face mask.  He uses CPAP every night.  This helps his sleep and daytime alertness.  His wife has to wake him up to adjust his mask because of mask leak.  Physical Exam  General - No distress ENT - No sinus tenderness, no oral exudate, no LAN, MP 4, enlarged tongue Cardiac - s1s2 regular, no murmur Chest - No wheeze/rales/dullness Back - No focal tenderness Abd - Soft, non-tender Ext - No edema Neuro - Normal strength Skin - No rashes Psych - normal mood, and behavior   Assessment/Plan  Obstructive sleep apnea. - will try changing him to auto CPAP setting and get copy of download 1 month after pressure change - he is to call if his issue with mask leak persists   Patient Instructions  Will have [facility] change your CPAP to auto setting  Will get CPAP report 1 month after pressure change and call with results  Follow up in 1 year     ViChesley MiresMD LeCorwin Springsager:  [phone]/12/2015, 9:53 AM

## 2015-08-09 DIAGNOSIS — H16223 Keratoconjunctivitis sicca, not specified as Sjogren's, bilateral: Secondary | ICD-10-CM | POA: Diagnosis not present

## 2015-08-15 ENCOUNTER — Other Ambulatory Visit (INDEPENDENT_AMBULATORY_CARE_PROVIDER_SITE_OTHER): Payer: Medicare Other

## 2015-08-15 DIAGNOSIS — E039 Hypothyroidism, unspecified: Secondary | ICD-10-CM | POA: Diagnosis not present

## 2015-08-15 LAB — TSH: TSH: 3.34 u[IU]/mL (ref 0.35–4.50)

## 2015-08-15 LAB — T4, FREE: Free T4: 0.92 ng/dL (ref 0.60–1.60)

## 2015-08-22 DIAGNOSIS — L219 Seborrheic dermatitis, unspecified: Secondary | ICD-10-CM | POA: Diagnosis not present

## 2015-09-23 ENCOUNTER — Other Ambulatory Visit: Payer: Self-pay | Admitting: Family Medicine

## 2015-09-25 ENCOUNTER — Telehealth: Payer: Self-pay | Admitting: Pulmonary Disease

## 2015-09-25 ENCOUNTER — Ambulatory Visit (INDEPENDENT_AMBULATORY_CARE_PROVIDER_SITE_OTHER): Payer: Medicare Other

## 2015-09-25 DIAGNOSIS — Z23 Encounter for immunization: Secondary | ICD-10-CM

## 2015-09-25 NOTE — Telephone Encounter (Signed)
Information has been updated in patient's chart. Called and left message on patient's voicemail advising him that information has been updated. Nothing further needed.

## 2015-11-09 DIAGNOSIS — H16223 Keratoconjunctivitis sicca, not specified as Sjogren's, bilateral: Secondary | ICD-10-CM | POA: Diagnosis not present

## 2016-03-28 HISTORY — PX: MOUTH SURGERY: SHX715

## 2016-04-08 DIAGNOSIS — H53431 Sector or arcuate defects, right eye: Secondary | ICD-10-CM | POA: Diagnosis not present

## 2016-04-08 DIAGNOSIS — T859XXA Unspecified complication of internal prosthetic device, implant and graft, initial encounter: Secondary | ICD-10-CM | POA: Diagnosis not present

## 2016-04-08 DIAGNOSIS — H02053 Trichiasis without entropian right eye, unspecified eyelid: Secondary | ICD-10-CM | POA: Diagnosis not present

## 2016-06-09 DIAGNOSIS — H40051 Ocular hypertension, right eye: Secondary | ICD-10-CM | POA: Diagnosis not present

## 2016-06-11 ENCOUNTER — Other Ambulatory Visit: Payer: Self-pay | Admitting: Family Medicine

## 2016-06-11 DIAGNOSIS — E7800 Pure hypercholesterolemia, unspecified: Secondary | ICD-10-CM

## 2016-06-11 DIAGNOSIS — Z125 Encounter for screening for malignant neoplasm of prostate: Secondary | ICD-10-CM

## 2016-06-11 DIAGNOSIS — E039 Hypothyroidism, unspecified: Secondary | ICD-10-CM

## 2016-06-12 ENCOUNTER — Other Ambulatory Visit: Payer: Self-pay | Admitting: *Deleted

## 2016-06-13 ENCOUNTER — Telehealth: Payer: Self-pay | Admitting: Family Medicine

## 2016-06-13 ENCOUNTER — Ambulatory Visit (INDEPENDENT_AMBULATORY_CARE_PROVIDER_SITE_OTHER): Payer: Medicare Other | Admitting: Family Medicine

## 2016-06-13 ENCOUNTER — Ambulatory Visit
Admission: RE | Admit: 2016-06-13 | Discharge: 2016-06-13 | Disposition: A | Discharge status: Home / Self Care | Payer: Medicare Other | Source: Ambulatory Visit | Attending: Family Medicine | Admitting: Family Medicine

## 2016-06-13 ENCOUNTER — Encounter: Payer: Self-pay | Admitting: Family Medicine

## 2016-06-13 ENCOUNTER — Ambulatory Visit (INDEPENDENT_AMBULATORY_CARE_PROVIDER_SITE_OTHER): Payer: Medicare Other

## 2016-06-13 ENCOUNTER — Other Ambulatory Visit: Payer: Medicare Other

## 2016-06-13 VITALS — BP 130/68 | HR 82 | Temp 98.6°F | Wt 223.2 lb

## 2016-06-13 VITALS — BP 130/82 | HR 76 | Temp 98.0°F | Ht 71.0 in | Wt 220.5 lb

## 2016-06-13 DIAGNOSIS — M79662 Pain in left lower leg: Secondary | ICD-10-CM

## 2016-06-13 DIAGNOSIS — Z125 Encounter for screening for malignant neoplasm of prostate: Secondary | ICD-10-CM

## 2016-06-13 DIAGNOSIS — E7800 Pure hypercholesterolemia, unspecified: Secondary | ICD-10-CM | POA: Diagnosis not present

## 2016-06-13 DIAGNOSIS — M79605 Pain in left leg: Secondary | ICD-10-CM | POA: Diagnosis not present

## 2016-06-13 DIAGNOSIS — Z0000 Encounter for general adult medical examination without abnormal findings: Secondary | ICD-10-CM

## 2016-06-13 DIAGNOSIS — E039 Hypothyroidism, unspecified: Secondary | ICD-10-CM | POA: Diagnosis not present

## 2016-06-13 DIAGNOSIS — M7989 Other specified soft tissue disorders: Secondary | ICD-10-CM | POA: Diagnosis not present

## 2016-06-13 LAB — CBC WITH DIFFERENTIAL/PLATELET
BASOS ABS: 0 10*3/uL (ref 0.0–0.1)
BASOS PCT: 0.7 % (ref 0.0–3.0)
EOS ABS: 0.1 10*3/uL (ref 0.0–0.7)
Eosinophils Relative: 2.4 % (ref 0.0–5.0)
HCT: 41.2 % (ref 39.0–52.0)
Hemoglobin: 13.7 g/dL (ref 13.0–17.0)
LYMPHS ABS: 1.8 10*3/uL (ref 0.7–4.0)
LYMPHS PCT: 34 % (ref 12.0–46.0)
MCHC: 33.2 g/dL (ref 30.0–36.0)
MCV: 92.9 fl (ref 78.0–100.0)
MONO ABS: 0.7 10*3/uL (ref 0.1–1.0)
Monocytes Relative: 14.2 % — ABNORMAL HIGH (ref 3.0–12.0)
NEUTROS ABS: 2.5 10*3/uL (ref 1.4–7.7)
NEUTROS PCT: 48.7 % (ref 43.0–77.0)
PLATELETS: 228 10*3/uL (ref 150.0–400.0)
RBC: 4.43 Mil/uL (ref 4.22–5.81)
RDW: 13.8 % (ref 11.5–15.5)
WBC: 5.2 10*3/uL (ref 4.0–10.5)

## 2016-06-13 LAB — COMPREHENSIVE METABOLIC PANEL
ALT: 20 U/L (ref 0–53)
AST: 21 U/L (ref 0–37)
Albumin: 4 g/dL (ref 3.5–5.2)
Alkaline Phosphatase: 53 U/L (ref 39–117)
BILIRUBIN TOTAL: 0.7 mg/dL (ref 0.2–1.2)
BUN: 15 mg/dL (ref 6–23)
CALCIUM: 9.1 mg/dL (ref 8.4–10.5)
CO2: 27 meq/L (ref 19–32)
CREATININE: 1.13 mg/dL (ref 0.40–1.50)
Chloride: 108 mEq/L (ref 96–112)
GFR: 79.59 mL/min (ref >60.00)
GLUCOSE: 99 mg/dL (ref 70–99)
Potassium: 4.5 mEq/L (ref 3.5–5.1)
SODIUM: 141 meq/L (ref 135–145)
Total Protein: 7.1 g/dL (ref 6.0–8.3)

## 2016-06-13 LAB — LIPID PANEL
CHOL/HDL RATIO: 4
Cholesterol: 156 mg/dL (ref 0–200)
HDL: 39.8 mg/dL (ref >39.00)
LDL CALC: 106 mg/dL — AB (ref 0–99)
NonHDL: 116.33
TRIGLYCERIDES: 53 mg/dL (ref 0.0–149.0)
VLDL: 10.6 mg/dL (ref 0.0–40.0)

## 2016-06-13 LAB — PSA, MEDICARE: PSA: 2 ng/ml (ref 0.10–4.00)

## 2016-06-13 LAB — TSH: TSH: 1.83 u[IU]/mL (ref 0.35–4.50)

## 2016-06-13 MED ORDER — APIXABAN 5 MG PO TABS: ORAL_TABLET | ORAL | 2 refills | Status: DC

## 2016-06-13 NOTE — Patient Instructions (Signed)
Justin Hester will call about your referral. See her on the way out.   They will call the report to Dr Darnell Level.  I already talked to him in the meantime.

## 2016-06-13 NOTE — Progress Notes (Signed)
Subjective:   Agnes Probert is a 83 y.o. male who presents for Medicare Annual/Subsequent preventive examination.  Review of Systems:  N/A Cardiac Risk Factors include: advanced age (>63mn, >>40women);male gender     Objective:    Vitals: BP 130/82 (BP Location: Left Arm, Patient Position: Sitting, Cuff Size: Normal)   Pulse 76   Temp 98 F (36.7 C) (Oral)   Ht _0  (1.803 m) Comment: no shoes  Wt 220 lb 8 oz (100 kg)   SpO2 97%   BMI 30.75 kg/m   Body mass index is 30.75 kg/m.  Tobacco History  Smoking Status  . Never Smoker  Smokeless Tobacco  . Never Used     Counseling given: No   Past Medical History:  Diagnosis Date  . Anemia   . Cataract   . Chronic constipation   . Diverticulosis of colon (without mention of hemorrhage)    constipation - new problem 3/12 and work up in progress...  . DJD (degenerative joint disease)   . DVT (deep venous thrombosis) (HCC) 1990s   6 mo coumadin  . GERD (gastroesophageal reflux disease)   . Hypertension   . Hypothyroidism   . Lumbar back pain   . Multiple pulmonary nodules 2006  . OSA on CPAP    Past Surgical History:  Procedure Laterality Date  . CATARACT EXTRACTION, BILATERAL     by Dr. BRicki Miller . COLONOSCOPY  2012   diverticulosis o/w WNL ([name]  . MOUTH SURGERY  03/28/2016   gum surgery  . TOTAL KNEE ARTHROPLASTY  left 9/06, right 10/07   Dr. DSharol Given  Family History  Problem Relation Age of Onset  . Heart disease Father   . Rheum arthritis Father   . Rheum arthritis Mother   . Cancer Neg Hx    History  Sexual Activity  . Sexual activity: No    Outpatient Encounter Prescriptions as of 06/13/2016  Medication Sig  . AMITIZA 24 MCG capsule TAKE ONE CAPSULE BY MOUTH TWICE DAILY ASNEEDED FOR CONSTIPATION  . amLODipine (NORVASC) 10 MG tablet Take 1 tablet (10 mg total) by mouth daily.  .Marland Kitchenamoxicillin (AMOXIL) 500 MG capsule Take 500 mg by mouth 3 (three) times daily. Prior to dental exam  .  aspirin 81 MG tablet Take by mouth daily.    . Desoximetasone 07.09% GEL 1 application daily.  . furosemide (LASIX) 40 MG tablet Take 1 tablet (40 mg total) by mouth daily.  .Marland Kitchenlevothyroxine (SYNTHROID, LEVOTHROID) 50 MCG tablet Take 1 tablet (523m daily). 2 days a week Tues/Fri take 2 tablets (10073mdaily)  . losartan (COZAAR) 100 MG tablet Take 1 tablet (100 mg total) by mouth daily.  . magnesium hydroxide (MILK OF MAGNESIA) 800 MG/5ML suspension Take 15 mLs by mouth 2 (two) times daily as needed for constipation.  . naproxen (NAPROSYN) 250 MG tablet Take 250 mg by mouth daily as needed.   . polyethylene glycol (MIRALAX / GLYCOLAX) packet Take 17 g by mouth as needed.    No facility-administered encounter medications on file as of 06/13/2016.     Activities of Daily Living In your present state of health, do you have any difficulty performing the following activities: 06/13/2016  Hearing? N  Vision? N  Difficulty concentrating or making decisions? N  Walking or climbing stairs? N  Dressing or bathing? N  Doing errands, shopping? N  Preparing Food and eating ? N  Using the Toilet? N  In the past six  months, have you accidently leaked urine? N  Do you have problems with loss of bowel control? N  Managing your Medications? N  Managing your Finances? N  Housekeeping or managing your Housekeeping? N  Some recent data might be hidden    Patient Care Team: Ria Bush, MD as PCP - General (Family Medicine) Marylynn Pearson, MD as Consulting Physician (Ophthalmology) Chesley Mires, MD as Consulting Physician (Pulmonary Disease) [place], Jennefer Bravo, MD as Referring Physician (Dermatology) Melissa Noon, OD as Referring Physician (Optometry)   Assessment:     Hearing Screening   125Hz 250Hz 500Hz 1000Hz 2000Hz 3000Hz 4000Hz 6000Hz 8000Hz  Right ear:   40 40 40  0    Left ear:   40 40 0  0    Vision Screening Comments: Last vision on Jun 13, 2016 with Dr. Delman Cheadle   Exercise  Activities and Dietary recommendations Current Exercise Habits: Home exercise routine, Type of exercise: Other - see comments (bicycling 5 miles 3x/wk, golf 1x/wk, stationary bike 30 min daily), Time (Minutes): > 60 (3 hrs daily), Frequency (Times/Week): 7, Weekly Exercise (Minutes/Week): 0, Intensity: Moderate, Exercise limited by: orthopedic condition(s)  Goals    . Increase physical activity          Starting 06/13/2016, I will continue to exercise for at least 3 hrs daily.       Fall Risk Fall Risk  06/13/2016 06/12/2015 06/09/2014 04/29/2013 04/27/2012  Falls in the past year? _0    Depression Screen PHQ 2/9 Scores 06/13/2016 06/12/2015 06/09/2014 04/29/2013  PHQ - 2 Score 0 0 0 0    Cognitive Function MMSE - Mini Mental State Exam 06/13/2016 06/12/2015  Orientation to time 5 5  Orientation to Place 5 5  Registration 3 3  Attention/ Calculation 0 0  Recall 3 3  Language- name 2 objects 0 0  Language- repeat 1 1  Language- follow 3 step command 3 3  Language- read & follow direction 0 0  Write a sentence 0 0  Copy design 0 0  Total score 20 20     PLEASE NOTE: A Mini-Cog screen was completed. Maximum score is 20. A value of 0 denotes this part of Folstein MMSE was not completed or the patient failed this part of the Mini-Cog screening.   Mini-Cog Screening Orientation to Time - Max 5 pts Orientation to Place - Max 5 pts Registration - Max 3 pts Recall - Max 3 pts Language Repeat - Max 1 pts Language Follow 3 Step Command - Max 3 pts     Immunization History  Administered Date(s) Administered  . Encephalitis 06/14/2014, 07/12/2014  . Hep A / Hep B 06/09/2014, 07/11/2014, 12/12/2014  . Influenza Split 11/04/2011  . Influenza, Seasonal, Injecte, Preservative Fre 01/09/2014  . Influenza,inj,Quad PF,36+ Mos 11/08/2012, 11/02/2014, 09/25/2015  . Pneumococcal Conjugate-13 04/29/2013  . Pneumococcal Polysaccharide-23 06/14/2014  . Tdap 09/03/2012  . Zoster 06/14/2014    Screening Tests Health Maintenance  Topic Date Due  . INFLUENZA VACCINE  08/20/2016  . DTaP/Tdap/Td (2 - Td) 09/04/2022  . TETANUS/TDAP  09/04/2022  . PNA vac Low Risk Adult  Completed      Plan:     I have personally reviewed and addressed the Medicare Annual Wellness questionnaire and have noted the following in the patient's chart:  A. Medical and social history B. Use of alcohol, tobacco or illicit drugs  C. Current medications and supplements D. Functional ability and status E.  Nutritional status F.  Physical activity G. Advance directives H. List of other physicians I.  Hospitalizations, surgeries, and ER visits in previous 12 months J.  [place] to include hearing, vision, cognitive, depression L. Referrals and appointments - none  In addition, I have reviewed and discussed with patient certain preventive protocols, quality metrics, and best practice recommendations. A written personalized care plan for preventive services as well as general preventive health recommendations were provided to patient.  See attached scanned questionnaire for additional information.   Signed,   Lindell Noe, MHA, BS, LPN Health Coach'

## 2016-06-13 NOTE — Progress Notes (Signed)
Single episode of DVT in the past, decades ago, in the L lower leg.  No FH clotting disorder.    Noted a few days ago.  L lower leg felt hot laterally.  Ankle swells some at baseline, usually uses compression stockings.  No trauma.    No FCNAVD.  He had filed down a callus and his nails on the L foot recently.  No CP, SOB, BLE edema.   Meds, vitals, and allergies reviewed.   ROS: Per HPI unless specifically indicated in ROS section   nad ncat rrr ctab abd soft Ext with : L calf warm laterally, calf not as ttp L calf 40cm, R calf 39cm.  Normal ROM L ankle, intact distal pulses in the BLE  Wells criteria 1 but noted h/o DVT in the same area.

## 2016-06-13 NOTE — Progress Notes (Signed)
PCP notes:   Health maintenance:  No gaps identified.  Abnormal screenings:   Hearing - failed  Patient concerns:   Pt verbalized concerns he may have another blood clot in his left lower leg. Pt reports feeling a "heat sensation" in his left leg. Patient denies pain or feet being cool to touch. Patient confirms cramps and swelling in ankle. PCP notified. Same day appt scheduled with another provider.  Nurse concerns:  None  Next PCP appt:   06/18/16 @ 0830

## 2016-06-13 NOTE — Telephone Encounter (Signed)
Received call report - distal popliteal vein DVT. Spoke with patient. Will treat with eliquis 35m bid x 7 days then change to 558mbid x at least 3 months  2nd DVT episode.  Will further discuss at f/u visit next week.

## 2016-06-13 NOTE — Patient Instructions (Signed)
Justin Hester , Thank you for taking time to come for your Medicare Wellness Visit. I appreciate your ongoing commitment to your health goals. Please review the following plan we discussed and let me know if I can assist you in the future.   These are the goals we discussed: Goals    . Increase physical activity          Starting 06/13/2016, I will continue to exercise for at least 3 hrs daily.        This is a list of the screening recommended for you and due dates:  Health Maintenance  Topic Date Due  . Flu Shot  08/20/2016  . DTaP/Tdap/Td vaccine (2 - Td) 09/04/2022  . Tetanus Vaccine  09/04/2022  . Pneumonia vaccines  Completed   Preventive Care for Adults  A healthy lifestyle and preventive care can promote health and wellness. Preventive health guidelines for adults include the following key practices.  . A routine yearly physical is a good way to check with your health care provider about your health and preventive screening. It is a chance to share any concerns and updates on your health and to receive a thorough exam.  . Visit your dentist for a routine exam and preventive care every 6 months. Brush your teeth twice a day and floss once a day. Good oral hygiene prevents tooth decay and gum disease.  . The frequency of eye exams is based on your age, health, family medical history, use  of contact lenses, and other factors. Follow your health care provider's ecommendations for frequency of eye exams.  . Eat a healthy diet. Foods like vegetables, fruits, whole grains, low-fat dairy products, and lean protein foods contain the nutrients you need without too many calories. Decrease your intake of foods high in solid fats, added sugars, and salt. Eat the right amount of calories for you. Get information about a proper diet from your health care provider, if necessary.  . Regular physical exercise is one of the most important things you can do for your health. Most adults should get  at least 150 minutes of moderate-intensity exercise (any activity that increases your heart rate and causes you to sweat) each week. In addition, most adults need muscle-strengthening exercises on 2 or more days a week.  Silver Sneakers may be a benefit available to you. To determine eligibility, you may visit the website: www.silversneakers.com or contact program at 2021299358 Mon-Fri between 8AM-8PM.   . Maintain a healthy weight. The body mass index (BMI) is a screening tool to identify possible weight problems. It provides an estimate of body fat based on height and weight. Your health care provider can find your BMI and can help you achieve or maintain a healthy weight.   For adults 20 years and older: ? A BMI below 18.5 is considered underweight. ? A BMI of 18.5 to 24.9 is normal. ? A BMI of 25 to 29.9 is considered overweight. ? A BMI of 30 and above is considered obese.   . Maintain normal blood lipids and cholesterol levels by exercising and minimizing your intake of saturated fat. Eat a balanced diet with plenty of fruit and vegetables. Blood tests for lipids and cholesterol should begin at age 73 and be repeated every 5 years. If your lipid or cholesterol levels are high, you are over 50, or you are at high risk for heart disease, you may need your cholesterol levels checked more frequently. Ongoing high lipid and cholesterol levels should  be treated with medicines if diet and exercise are not working.  . If you smoke, find out from your health care provider how to quit. If you do not use tobacco, please do not start.  . If you choose to drink alcohol, please do not consume more than 2 drinks per day. One drink is considered to be 12 ounces (355 mL) of beer, 5 ounces (148 mL) of wine, or 1.5 ounces (44 mL) of liquor.  . If you are 28-68 years old, ask your health care provider if you should take aspirin to prevent strokes.  . Use sunscreen. Apply sunscreen liberally and repeatedly  throughout the day. You should seek shade when your shadow is shorter than you. Protect yourself by wearing long sleeves, pants, a wide-brimmed hat, and sunglasses year round, whenever you are outdoors.  . Once a month, do a whole body skin exam, using a mirror to look at the skin on your back. Tell your health care provider of new moles, moles that have irregular borders, moles that are larger than a pencil eraser, or moles that have changed in shape or color.

## 2016-06-13 NOTE — Progress Notes (Signed)
Pre visit review using our clinic review tool, if applicable. No additional management support is needed unless otherwise documented below in the visit note.

## 2016-06-14 NOTE — Progress Notes (Signed)
I reviewed health advisor's note, was available for consultation, and agree with documentation and plan.  

## 2016-06-15 DIAGNOSIS — I824Z2 Acute embolism and thrombosis of unspecified deep veins of left distal lower extremity: Secondary | ICD-10-CM

## 2016-06-15 HISTORY — DX: Acute embolism and thrombosis of unspecified deep veins of left distal lower extremity: I82.4Z2

## 2016-06-15 NOTE — Assessment & Plan Note (Signed)
Concern for DVT, Wells criteria 1 but noted h/o DVT in the same area.   Sent for u/s, see attached result note.  PCP notified re: plan and PCP addressed the u/s results.

## 2016-06-18 ENCOUNTER — Encounter: Payer: Self-pay | Admitting: Family Medicine

## 2016-06-18 ENCOUNTER — Telehealth: Payer: Self-pay | Admitting: Family Medicine

## 2016-06-18 ENCOUNTER — Ambulatory Visit (INDEPENDENT_AMBULATORY_CARE_PROVIDER_SITE_OTHER): Payer: Medicare Other | Admitting: Family Medicine

## 2016-06-18 ENCOUNTER — Telehealth: Payer: Self-pay | Admitting: *Deleted

## 2016-06-18 VITALS — BP 124/64 | HR 77 | Temp 97.4°F | Ht 71.0 in | Wt 224.5 lb

## 2016-06-18 DIAGNOSIS — Z0000 Encounter for general adult medical examination without abnormal findings: Secondary | ICD-10-CM | POA: Diagnosis not present

## 2016-06-18 DIAGNOSIS — I824Z2 Acute embolism and thrombosis of unspecified deep veins of left distal lower extremity: Secondary | ICD-10-CM | POA: Diagnosis not present

## 2016-06-18 DIAGNOSIS — K5904 Chronic idiopathic constipation: Secondary | ICD-10-CM | POA: Diagnosis not present

## 2016-06-18 DIAGNOSIS — E7800 Pure hypercholesterolemia, unspecified: Secondary | ICD-10-CM | POA: Diagnosis not present

## 2016-06-18 DIAGNOSIS — E039 Hypothyroidism, unspecified: Secondary | ICD-10-CM | POA: Diagnosis not present

## 2016-06-18 DIAGNOSIS — H9192 Unspecified hearing loss, left ear: Secondary | ICD-10-CM | POA: Diagnosis not present

## 2016-06-18 DIAGNOSIS — Z7189 Other specified counseling: Secondary | ICD-10-CM | POA: Diagnosis not present

## 2016-06-18 DIAGNOSIS — M159 Polyosteoarthritis, unspecified: Secondary | ICD-10-CM

## 2016-06-18 DIAGNOSIS — I10 Essential (primary) hypertension: Secondary | ICD-10-CM | POA: Diagnosis not present

## 2016-06-18 DIAGNOSIS — M150 Primary generalized (osteo)arthritis: Secondary | ICD-10-CM | POA: Diagnosis not present

## 2016-06-18 NOTE — Telephone Encounter (Signed)
Fax received indicating pts amitiza 68mg are approved; valid 05/18/2016-06/17/2019. Pt advised at [place] copy faxed to pharmacy

## 2016-06-18 NOTE — Assessment & Plan Note (Addendum)
Unprovoked symptomatic distal popliteal vein DVT. Tolerating eliquis well. Will treat with 3 mo eliquis.  Then return for f/u visit. Discussed AC workup 1 mo off eliquis. Would check D dimer at that time as well.

## 2016-06-18 NOTE — Assessment & Plan Note (Addendum)
Stable on amitiza PRN as well as miralax 1 capful daily

## 2016-06-18 NOTE — Assessment & Plan Note (Signed)
Living will received and scanned 07/2014. No HCPOA form but pt wants wife to be this. Does not want prolonged life support if incurable condition.

## 2016-06-18 NOTE — Progress Notes (Signed)
BP 124/64   Pulse 77   Temp 97.4 F (36.3 C) (Oral)   Ht _0  (1.803 m)   Wt 224 lb 8 oz (101.8 kg)   SpO2 97%   BMI 31.31 kg/m    CC: CPE Subjective:    Patient ID: Justin Hester, male    DOB: 1930-11-29, 83 y.o.   MRN: [id number]  HPI: Justin Hester is a 83 y.o. male presenting on 06/18/2016 for Annual Exam   Saw Katha Cabal last week for medicare wellness visit. Note reviewed.   Seen here last week as well with concern for LLE DVT. Symptoms presented with burning and heat lateral lower left leg. US showed distal popliteal vein DVT. He was started on eliquis 41m bid x 7 days then 580mbid. Tolerating med well.  Denies inciting prolonged car or plane rides.  H/o LLE DVT remotely. No fmhx blood clots. No prolonged travel.   Preventative: Colonoscopy 2012 mild diverticulosis o/w WNL (JArdis HughsProstate cancer screening - discussed continued screening at advanced age, pt/wife desire to continue screening for prostate cancer  Flu yearly Tdap 2014 Pneumovax - done. prevnar done.  zostavax - 05/2014 Shingrix - discussed. Will check with [facility].  Living will received and scanned 07/2014. No HCPOA form but pt wants wife to be this. Does not want prolonged life support if incurable condition. Seat belt use discussed Sunscreen use discussed. No changing moles on skin. Non smoker Alcohol - rare  Lives with wife. 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 Interim medical history since our last visit reviewed. Allergies and medications reviewed and updated. Outpatient Medications Prior to Visit  Medication Sig Dispense Refill  . AMITIZA 24 MCG capsule TAKE ONE CAPSULE BY MOUTH TWICE DAILY ASNEEDED FOR CONSTIPATION 60 capsule  7  . amLODipine (NORVASC) 10 MG tablet Take 1 tablet (10 mg total) by mouth daily. 90 tablet 3  . apixaban (ELIQUIS) 5 MG TABS tablet Take two tablets twice daily for 7 days then one tablet twice daily 74 tablet 2  . Desoximetasone 0.2.01 GEL 1 application daily.    . furosemide (LASIX) 40 MG tablet Take 1 tablet (40 mg total) by mouth daily. 90 tablet 3  . levothyroxine (SYNTHROID, LEVOTHROID) 50 MCG tablet Take 1 tablet (502mdaily). 2 days a week Tues/Fri take 2 tablets (100m18maily) 120 tablet 3  . losartan (COZAAR) 100 MG tablet Take 1 tablet (100 mg total) by mouth daily. 90 tablet 3  . magnesium hydroxide (MILK OF MAGNESIA) 800 MG/5ML suspension Take 15 mLs by mouth 2 (two) times daily as needed for constipation. 240 mL 0  . naproxen (NAPROSYN) 250 MG tablet Take 250 mg by mouth daily as needed.     . polyethylene glycol (MIRALAX / GLYCOLAX) packet Take 17 g by mouth as needed.     . asMarland Kitchenirin 81 MG tablet Take by mouth daily.       No facility-administered medications prior to visit.      Per HPI unless specifically indicated in ROS section below Review of Systems  Constitutional: Negative for activity change, appetite change, chills, fatigue, fever and unexpected weight change.  HENT: Negative for hearing loss.   Eyes: Negative for visual disturbance.  Respiratory: Negative for cough, chest  tightness, shortness of breath and wheezing.   Cardiovascular: Positive for leg swelling (mild ankle left). Negative for chest pain and palpitations.  Gastrointestinal: Negative for abdominal distention, abdominal pain, blood in stool, constipation, diarrhea, nausea and vomiting.       Constipation controlled with miralax daily  Genitourinary: Negative for difficulty urinating and hematuria.  Musculoskeletal: Negative for arthralgias, myalgias and neck pain.  Skin: Negative for rash.  Neurological: Negative for dizziness, seizures, syncope and headaches.  Hematological: Negative for  adenopathy. Does not bruise/bleed easily.  Psychiatric/Behavioral: Negative for dysphoric mood. The patient is not nervous/anxious.        Objective:    BP 124/64   Pulse 77   Temp 97.4 F (36.3 C) (Oral)   Ht _0  (1.803 m)   Wt 224 lb 8 oz (101.8 kg)   SpO2 97%   BMI 31.31 kg/m   Wt Readings from Last 3 Encounters:  06/18/16 224 lb 8 oz (101.8 kg)  06/13/16 223 lb 4 oz (101.3 kg)  06/13/16 220 lb 8 oz (100 kg)    Physical Exam  Constitutional: He is oriented to person, place, and time. He appears well-developed and well-nourished. No distress.  HENT:  Head: Normocephalic and atraumatic.  Right Ear: Hearing, tympanic membrane, external ear and ear canal normal.  Left Ear: Hearing, tympanic membrane, external ear and ear canal normal.  Nose: Nose normal.  Mouth/Throat: Uvula is midline, oropharynx is clear and moist and mucous membranes are normal. No oropharyngeal exudate, posterior oropharyngeal edema or posterior oropharyngeal erythema.  Eyes: Conjunctivae and EOM are normal. Pupils are equal, round, and reactive to light. No scleral icterus.  Neck: Normal range of motion. Neck supple. No thyromegaly present.  Cardiovascular: Normal rate, regular rhythm, normal heart sounds and intact distal pulses.   No murmur heard. Pulses:      Radial pulses are 2+ on the right side, and 2+ on the left side.  Pulmonary/Chest: Effort normal and breath sounds normal. No respiratory distress. He has no wheezes. He has no rales.  Abdominal: Soft. Bowel sounds are normal. He exhibits no distension and no mass. There is no tenderness. There is no rebound and no guarding.  Genitourinary: Rectum normal and prostate normal. Rectal exam shows no external hemorrhoid, no internal hemorrhoid, no fissure, no mass, no tenderness and anal tone normal. Prostate is not enlarged (20gm) and not tender.  Musculoskeletal: Normal range of motion. He exhibits no edema.  2+ DP on right, slightly diminished on  left  Lymphadenopathy:    He has no cervical adenopathy.  Neurological: He is alert and oriented to person, place, and time.  CN grossly intact, station and gait intact  Skin: Skin is warm and dry. No rash noted.  Psychiatric: He has a normal mood and affect. His behavior is normal. Judgment and thought content normal.  Nursing note and vitals reviewed.  Results for orders placed or performed in visit on 06/13/16  Lipid panel  Result Value Ref Range   Cholesterol 156 0 - 200 mg/dL   Triglycerides 53.0 0.0 - 149.0 mg/dL   HDL 39.80 >39.00 mg/dL   VLDL 10.6 0.0 - 40.0 mg/dL   LDL Cholesterol 106 (H) 0 - 99 mg/dL   Total CHOL/HDL Ratio 4    NonHDL 116.33   Comprehensive metabolic panel  Result Value Ref Range   Sodium 141 135 - 145 mEq/L   Potassium 4.5 3.5 - 5.1 mEq/L   Chloride 108 96 - 112 mEq/L  CO2 27 19 - 32 mEq/L   Glucose, Bld 99 70 - 99 mg/dL   BUN 15 6 - 23 mg/dL   Creatinine, Ser 1.13 0.40 - 1.50 mg/dL   Total Bilirubin 0.7 0.2 - 1.2 mg/dL   Alkaline Phosphatase 53 39 - 117 U/L   AST 21 0 - 37 U/L   ALT 20 0 - 53 U/L   Total Protein 7.1 6.0 - 8.3 g/dL   Albumin 4.0 3.5 - 5.2 g/dL   Calcium 9.1 8.4 - 10.5 mg/dL   GFR 79.59 >60.00 mL/min  TSH  Result Value Ref Range   TSH 1.83 0.35 - 4.50 uIU/mL  PSA, Medicare  Result Value Ref Range   PSA 2.00 0.10 - 4.00 ng/ml  CBC with Differential/Platelet  Result Value Ref Range   WBC 5.2 4.0 - 10.5 K/uL   RBC 4.43 4.22 - 5.81 Mil/uL   Hemoglobin 13.7 13.0 - 17.0 g/dL   HCT 41.2 39.0 - 52.0 %   MCV 92.9 78.0 - 100.0 fl   MCHC 33.2 30.0 - 36.0 g/dL   RDW 13.8 11.5 - 15.5 %   Platelets 228.0 150.0 - 400.0 K/uL   Neutrophils Relative % 48.7 43.0 - 77.0 %   Lymphocytes Relative 34.0 12.0 - 46.0 %   Monocytes Relative 14.2 (H) 3.0 - 12.0 %   Eosinophils Relative 2.4 0.0 - 5.0 %   Basophils Relative 0.7 0.0 - 3.0 %   Neutro Abs 2.5 1.4 - 7.7 K/uL   Lymphs Abs 1.8 0.7 - 4.0 K/uL   Monocytes Absolute 0.7 0.1 - 1.0 K/uL     Eosinophils Absolute 0.1 0.0 - 0.7 K/uL   Basophils Absolute 0.0 0.0 - 0.1 K/uL      Assessment & Plan:   Problem List Items Addressed This Visit    Advanced care planning/counseling discussion    Living will received and scanned 07/2014. No HCPOA form but pt wants wife to be this. Does not want prolonged life support if incurable condition.      Chronic idiopathic constipation    Stable on amitiza PRN as well as miralax 1 capful daily      Essential hypertension    Chronic, stable. Continue current regimen.       Health maintenance examination - Primary    Preventative protocols reviewed and updated unless pt declined. Discussed healthy diet and lifestyle.       HYPERCHOLESTEROLEMIA    Not on statin. Chronic, stable. Will stay off statin at this time.  ASCVD 10 yr risk = 23% (using age 31).       Hypothyroidism    Chronic, stable. Continue current regimen.       Left ear hearing loss    Minimal. He states he had comprehensive evaluation at [facility] - reassuringly told no need for hearing aides.       Lower leg DVT (deep venous thromboembolism), acute, left (HCC)    Unprovoked symptomatic distal popliteal vein DVT. Tolerating eliquis well. Will treat with 3 mo eliquis.  Then return for f/u visit. Discussed AC workup 1 mo off eliquis. Would check D dimer at that time as well.       Osteoarthritis       Follow up plan: Return in about 3 months (around 09/13/2016) for follow up visit.  Ria Bush, MD

## 2016-06-18 NOTE — Patient Instructions (Addendum)
Continue eliquis for a total of 3 months, then restart aspirin when finished.  Return in 3 months for follow up visit.  After 1 month off eliquis, return for further lab work.  Think about new 2 shot shingles series (shingrix).  You are doing well today.   Health Maintenance, Male A healthy lifestyle and preventive care is important for your health and wellness. Ask your health care provider about what schedule of regular examinations is right for you. What should I know about weight and diet?  Eat a Healthy Diet  Eat plenty of vegetables, fruits, whole grains, low-fat dairy products, and lean protein.  Do not eat a lot of foods high in solid fats, added sugars, or salt. Maintain a Healthy Weight  Regular exercise can help you achieve or maintain a healthy weight. You should:  Do at least 150 minutes of exercise each week. The exercise should increase your heart rate and make you sweat (moderate-intensity exercise).  Do strength-training exercises at least twice a week. Watch Your Levels of Cholesterol and Blood Lipids  Have your blood tested for lipids and cholesterol every 5 years starting at 81 years of age. If you are at high risk for heart disease, you should start having your blood tested when you are 81 years old. You may need to have your cholesterol levels checked more often if:  Your lipid or cholesterol levels are high.  You are older than 81 years of age.  You are at high risk for heart disease. What should I know about cancer screening? Many types of cancers can be detected early and may often be prevented. Lung Cancer  You should be screened every year for lung cancer if:  You are a current smoker who has smoked for at least 30 years.  You are a former smoker who has quit within the past 15 years.  Talk to your health care provider about your screening options, when you should start screening, and how often you should be screened. Colorectal Cancer  Routine  colorectal cancer screening usually begins at 81 years of age and should be repeated every 5-10 years until you are 81 years old. You may need to be screened more often if early forms of precancerous polyps or small growths are found. Your health care provider may recommend screening at an earlier age if you have risk factors for colon cancer.  Your health care provider may recommend using home test kits to check for hidden blood in the stool.  A small camera at the end of a tube can be used to examine your colon (sigmoidoscopy or colonoscopy). This checks for the earliest forms of colorectal cancer. Prostate and Testicular Cancer  Depending on your age and overall health, your health care provider may do certain tests to screen for prostate and testicular cancer.  Talk to your health care provider about any symptoms or concerns you have about testicular or prostate cancer. Skin Cancer  Check your skin from head to toe regularly.  Tell your health care provider about any new moles or changes in moles, especially if:  There is a change in a mole's size, shape, or color.  You have a mole that is larger than a pencil eraser.  Always use sunscreen. Apply sunscreen liberally and repeat throughout the day.  Protect yourself by wearing long sleeves, pants, a wide-brimmed hat, and sunglasses when outside. What should I know about heart disease, diabetes, and high blood pressure?  If you are 18-39 years  of age, have your blood pressure checked every 3-5 years. If you are 37 years of age or older, have your blood pressure checked every year. You should have your blood pressure measured twice-once when you are at a hospital or clinic, and once when you are not at a hospital or clinic. Record the average of the two measurements. To check your blood pressure when you are not at a hospital or clinic, you can use:  An automated blood pressure machine at a pharmacy.  A home blood pressure  monitor.  Talk to your health care provider about your target blood pressure.  If you are between 74-59 years old, ask your health care provider if you should take aspirin to prevent heart disease.  Have regular diabetes screenings by checking your fasting blood sugar level.  If you are at a normal weight and have a low risk for diabetes, have this test once every three years after the age of 61.  If you are overweight and have a high risk for diabetes, consider being tested at a younger age or more often.  A one-time screening for abdominal aortic aneurysm (AAA) by ultrasound is recommended for men aged 21-75 years who are current or former smokers. What should I know about preventing infection? Hepatitis B  If you have a higher risk for hepatitis B, you should be screened for this virus. Talk with your health care provider to find out if you are at risk for hepatitis B infection. Hepatitis C  Blood testing is recommended for:  Everyone born from [birth year] through 1963.  Anyone with known risk factors for hepatitis C. Sexually Transmitted Diseases (STDs)  You should be screened each year for STDs including gonorrhea and chlamydia if:  You are sexually active and are younger than 81 years of age.  You are older than 81 years of age and your health care provider tells you that you are at risk for this type of infection.  Your sexual activity has changed since you were last screened and you are at an increased risk for chlamydia or gonorrhea. Ask your health care provider if you are at risk.  Talk with your health care provider about whether you are at high risk of being infected with HIV. Your health care provider may recommend a prescription medicine to help prevent HIV infection. What else can I do?  Schedule regular health, dental, and eye exams.  Stay current with your vaccines (immunizations).  Do not use any tobacco products, such as cigarettes, chewing tobacco, and  e-cigarettes. If you need help quitting, ask your health care provider.  Limit alcohol intake to no more than 2 drinks per day. One drink equals 12 ounces of beer, 5 ounces of wine, or 1 ounces of hard liquor.  Do not use street drugs.  Do not share needles.  Ask your health care provider for help if you need support or information about quitting drugs.  Tell your health care provider if you often feel depressed.  Tell your health care provider if you have ever been abused or do not feel safe at home. This information is not intended to replace advice given to you by your health care provider. Make sure you discuss any questions you have with your health care provider. Document Released: 07/05/2007 Document Revised: 09/05/2015 Document Reviewed: 10/10/2014 Elsevier Interactive Patient Education  2017 [employer].

## 2016-06-18 NOTE — Assessment & Plan Note (Addendum)
Not on statin. Chronic, stable. Will stay off statin at this time.  ASCVD 10 yr risk = 23% (using age 83).

## 2016-06-18 NOTE — Assessment & Plan Note (Addendum)
Minimal. He states he had comprehensive evaluation at [facility] - reassuringly told no need for hearing aides.

## 2016-06-18 NOTE — Assessment & Plan Note (Signed)
Chronic, stable. Continue current regimen.

## 2016-06-18 NOTE — Telephone Encounter (Signed)
Placed in your in box.

## 2016-06-18 NOTE — Telephone Encounter (Signed)
Pt dropped off form for Handicapped Plate to be filled out. Placed in [place] tower. He stated he just received the one for the placard but wants the tag as well.

## 2016-06-18 NOTE — Assessment & Plan Note (Signed)
Preventative protocols reviewed and updated unless pt declined. Discussed healthy diet and lifestyle.

## 2016-06-19 NOTE — Telephone Encounter (Signed)
Patient notified form is ready for pick up.

## 2016-06-19 NOTE — Telephone Encounter (Signed)
Signed and in my outbox.

## 2016-07-07 ENCOUNTER — Ambulatory Visit (INDEPENDENT_AMBULATORY_CARE_PROVIDER_SITE_OTHER): Payer: Medicare Other | Admitting: Pulmonary Disease

## 2016-07-07 ENCOUNTER — Encounter: Payer: Self-pay | Admitting: Pulmonary Disease

## 2016-07-07 VITALS — BP 128/82 | HR 77 | Ht 72.0 in | Wt 219.0 lb

## 2016-07-07 DIAGNOSIS — G4733 Obstructive sleep apnea (adult) (pediatric): Secondary | ICD-10-CM

## 2016-07-07 DIAGNOSIS — Z9989 Dependence on other enabling machines and devices: Secondary | ICD-10-CM

## 2016-07-07 NOTE — Progress Notes (Signed)
Current Outpatient Prescriptions on File Prior to Visit  Medication Sig  . amLODipine (NORVASC) 10 MG tablet Take 1 tablet (10 mg total) by mouth daily.  Marland Kitchen apixaban (ELIQUIS) 5 MG TABS tablet Take two tablets twice daily for 7 days then one tablet twice daily  . Desoximetasone 4.44 % GEL 1 application daily.  . furosemide (LASIX) 40 MG tablet Take 1 tablet (40 mg total) by mouth daily.  Marland Kitchen levothyroxine (SYNTHROID, LEVOTHROID) 50 MCG tablet Take 1 tablet (83mg daily). 2 days a week Tues/Fri take 2 tablets (1080m daily)  . losartan (COZAAR) 100 MG tablet Take 1 tablet (100 mg total) by mouth daily.  . magnesium hydroxide (MILK OF MAGNESIA) 800 MG/5ML suspension Take 15 mLs by mouth 2 (two) times daily as needed for constipation.  . naproxen (NAPROSYN) 250 MG tablet Take 250 mg by mouth daily as needed.   . polyethylene glycol (MIRALAX / GLYCOLAX) packet Take 17 g by mouth as needed.   . [DISCONTINUED] losartan-hydrochlorothiazide (HYZAAR) 100-25 MG per tablet Take 1 tablet by mouth daily.   No current facility-administered medications on file prior to visit.      Chief Complaint  Patient presents with  . Sleep Apnea    Follow-up. Pt denies any concerns since last visit. Pt still states that the CPAP machine is working good. Pt denies any leaks. DME- [facility]     Sleep tests PSG 06/17/08 >> AHI 50 CPAP 12/10/15 to 01/08/16 >> used on 30 of 30 nights with average 7 hrs 35 min.  Average AHI 2.3 with median CPAP 10 and 95 th percentile CPAP 13 cm H2O  Past medical hx HTN, DVT 1990's, DJD, Chronic back pain, Anemia, GERD, Diverticulosis, Hypothyroidism, Lt leg DVT 2018  Past surgical hx, Allergies, Family hx, Social hx all reviewed.  Vital Signs BP 128/82 (BP Location: Left Arm, Cuff Size: Normal)   Pulse 77   Ht 6' (1.829 m)   Wt 219 lb (99.3 kg)   SpO2 98%   BMI 29.70 kg/m   History of Present Illness [name] a [age].o. male with obstructive sleep apnea.  He is doing  well with CPAP.  Uses every night.  No issues with face mask.  Uses full face.  Uses bathroom once per night.  Not napping.  Was recently found to have Lt leg DVT.  Reports having DVT about 20 yrs ago also.  Physical Exam  General - pleasant Eyes - pupils reactive ENT - no sinus tenderness, no oral exudate, no LAN Cardiac - regular, no murmur Chest - no wheeze, rales Abd - soft, non tender Ext - no edema Skin - no rashes Neuro - normal strength Psych - normal mood   Assessment/Plan  Obstructive sleep apnea. - he is compliant with CPAP and reports benefit from CPAP - will have his DME check why his machine is no longer transmitting data   Patient Instructions  Follow up in 1 year    ViChesley MiresMD [place]:  [phone]/18/2018, 9:34 AM

## 2016-07-07 NOTE — Patient Instructions (Signed)
Follow up in 1 year.

## 2016-07-14 ENCOUNTER — Other Ambulatory Visit: Payer: Self-pay | Admitting: Family Medicine

## 2016-07-16 ENCOUNTER — Telehealth: Payer: Self-pay | Admitting: Pulmonary Disease

## 2016-07-16 NOTE — Telephone Encounter (Signed)
Auto CPAP 04/08/16 to 07/06/16 >> used on 90 of 90 nights with average 7 hrs 24 min.  Average AHI 2.7 with median CPAP 10 and 95 th percentile CPAP 13 cm H2O   Will have my nurse inform pt that CPAP report looked good.

## 2016-07-17 NOTE — Telephone Encounter (Signed)
Patient returned call, placed on hold, when I went back to him he had hung up.

## 2016-07-17 NOTE — Telephone Encounter (Signed)
Pt is aware of results and voiced his understanding. Nothing further needed.  

## 2016-07-17 NOTE — Telephone Encounter (Signed)
lmtcb X1 for pt  

## 2016-08-21 DIAGNOSIS — L219 Seborrheic dermatitis, unspecified: Secondary | ICD-10-CM | POA: Diagnosis not present

## 2016-09-12 ENCOUNTER — Ambulatory Visit (INDEPENDENT_AMBULATORY_CARE_PROVIDER_SITE_OTHER): Payer: Medicare Other | Admitting: Family Medicine

## 2016-09-12 ENCOUNTER — Encounter: Payer: Self-pay | Admitting: Family Medicine

## 2016-09-12 VITALS — BP 126/74 | HR 89 | Temp 98.2°F | Wt 221.2 lb

## 2016-09-12 DIAGNOSIS — I824Z2 Acute embolism and thrombosis of unspecified deep veins of left distal lower extremity: Secondary | ICD-10-CM | POA: Diagnosis not present

## 2016-09-12 NOTE — Progress Notes (Signed)
BP 126/74   Pulse 89   Temp 98.2 F (36.8 C) (Oral)   Wt 221 lb 4 oz (100.4 kg)   SpO2 96%   BMI 30.01 kg/m    CC: f/u DVT Subjective:    Patient ID: Justin Hester, male    DOB: 1930-02-19, 83 y.o.   MRN: [id number]  HPI: Justin Hester is a 83 y.o. male presenting on 09/12/2016 for Follow-up (DVT)   See prior note for details. Seen here 05/2016 with concern for LLE DVT. Symptoms presented with burning and heat lateral lower left leg. Denies inciting prolonged car or plane rides. US showed distal popliteal vein DVT. He was started on eliquis 5m bid x 7 days then 520mbid. Tolerating med well.   H/o LLE DVT remotely. No fmhx blood clots. No prolonged travel.   2nd unprovoked DVT in lifetime.  Tolerated eliquis well. Presented with leg "heat" and mild ache. No further leg symptoms.   Relevant past medical, surgical, family and social history reviewed and updated as indicated. Interim medical history since our last visit reviewed. Allergies and medications reviewed and updated. Outpatient Medications Prior to Visit  Medication Sig Dispense Refill  . amLODipine (NORVASC) 10 MG tablet TAKE 1 TABLET BY MOUTH DAILY 90 tablet 2  . apixaban (ELIQUIS) 5 MG TABS tablet Take two tablets twice daily for 7 days then one tablet twice daily 74 tablet 2  . Desoximetasone 0.1.96 GEL 1 application daily.    . furosemide (LASIX) 40 MG tablet TAKE 1 TABLET BY MOUTH DAILY 90 tablet 2  . levothyroxine (SYNTHROID, LEVOTHROID) 50 MCG tablet TAKE ONE TABLET BY MOUTH DAILY, AND ON TWO DAYS OF THE WEEK (TUES & FRI) TAKE 2TABLETS. 120 tablet 2  . losartan (COZAAR) 100 MG tablet TAKE 1 TABLET BY MOUTH DAILY 90 tablet 2  . loteprednol (LOTEMAX) 0.5 % ophthalmic suspension Place 1 drop into both eyes 2 times daily.    . magnesium hydroxide (MILK OF MAGNESIA) 800 MG/5ML suspension Take 15 mLs by mouth 2 (two) times daily as needed for constipation. 240 mL 0  . naproxen (NAPROSYN) 250 MG tablet Take 250 mg  by mouth daily as needed.     . polyethylene glycol (MIRALAX / GLYCOLAX) packet Take 17 g by mouth as needed.      No facility-administered medications prior to visit.      Per HPI unless specifically indicated in ROS section below Review of Systems     Objective:    BP 126/74   Pulse 89   Temp 98.2 F (36.8 C) (Oral)   Wt 221 lb 4 oz (100.4 kg)   SpO2 96%   BMI 30.01 kg/m   Wt Readings from Last 3 Encounters:  09/12/16 221 lb 4 oz (100.4 kg)  07/07/16 219 lb (99.3 kg)  06/18/16 224 lb 8 oz (101.8 kg)    Physical Exam  Constitutional: He appears well-developed and well-nourished. No distress.  Musculoskeletal: Normal range of motion. He exhibits edema.  Compression stockings in place No palpable cords at left leg No pain at left calf  Skin: Skin is warm and dry. No rash noted. No erythema.  Nursing note and vitals reviewed.  Results for orders placed or performed in visit on 06/13/16  Lipid panel  Result Value Ref Range   Cholesterol 156 0 - 200 mg/dL   Triglycerides 53.0 0.0 - 149.0 mg/dL   HDL 39.80 >39.00 mg/dL   VLDL 10.6 0.0 - 40.0 mg/dL   LDL  Cholesterol 106 (H) 0 - 99 mg/dL   Total CHOL/HDL Ratio 4    NonHDL 116.33   Comprehensive metabolic panel  Result Value Ref Range   Sodium 141 135 - 145 mEq/L   Potassium 4.5 3.5 - 5.1 mEq/L   Chloride 108 96 - 112 mEq/L   CO2 27 19 - 32 mEq/L   Glucose, Bld 99 70 - 99 mg/dL   BUN 15 6 - 23 mg/dL   Creatinine, Ser 1.13 0.40 - 1.50 mg/dL   Total Bilirubin 0.7 0.2 - 1.2 mg/dL   Alkaline Phosphatase 53 39 - 117 U/L   AST 21 0 - 37 U/L   ALT 20 0 - 53 U/L   Total Protein 7.1 6.0 - 8.3 g/dL   Albumin 4.0 3.5 - 5.2 g/dL   Calcium 9.1 8.4 - 10.5 mg/dL   GFR 79.59 >60.00 mL/min  TSH  Result Value Ref Range   TSH 1.83 0.35 - 4.50 uIU/mL  PSA, Medicare  Result Value Ref Range   PSA 2.00 0.10 - 4.00 ng/ml  CBC with Differential/Platelet  Result Value Ref Range   WBC 5.2 4.0 - 10.5 K/uL   RBC 4.43 4.22 - 5.81  Mil/uL   Hemoglobin 13.7 13.0 - 17.0 g/dL   HCT 41.2 39.0 - 52.0 %   MCV 92.9 78.0 - 100.0 fl   MCHC 33.2 30.0 - 36.0 g/dL   RDW 13.8 11.5 - 15.5 %   Platelets 228.0 150.0 - 400.0 K/uL   Neutrophils Relative % 48.7 43.0 - 77.0 %   Lymphocytes Relative 34.0 12.0 - 46.0 %   Monocytes Relative 14.2 (H) 3.0 - 12.0 %   Eosinophils Relative 2.4 0.0 - 5.0 %   Basophils Relative 0.7 0.0 - 3.0 %   Neutro Abs 2.5 1.4 - 7.7 K/uL   Lymphs Abs 1.8 0.7 - 4.0 K/uL   Monocytes Absolute 0.7 0.1 - 1.0 K/uL   Eosinophils Absolute 0.1 0.0 - 0.7 K/uL   Basophils Absolute 0.0 0.0 - 0.1 K/uL      Assessment & Plan:  Over 25 minutes were spent face-to-face with the patient during this encounter and >50% of that time was spent on counseling and coordination of care  Problem List Items Addressed This Visit    Lower leg DVT (deep venous thromboembolism), acute, left (HCC) - Primary    Second unprovoked symptomatic distal DVT, this time L popliteal vein. He is about to complete 3 months of eliquis treatment.  Discussed options of ongoing lifelong anticoagulation vs stopping after 3 months.  I cited 15% risk of recurrence after fist year and 45% recurrence chance at 5 yrs.  He has tolerated eliquis well. He decides to discontinue anticoagulation after 3 months and then returning 1 month later for coagulability panel to further evaluate bleeding risk, then decide on ongoing anticoagulation.          Follow up plan: Return if symptoms worsen or fail to improve.  Ria Bush, MD

## 2016-09-12 NOTE — Patient Instructions (Signed)
Finish eliquis bottle then stop as we will have completed 3 months of blood thinner therapy.  Come in 1 month after stopping eliquis to recheck blood work for blood clotting risk - schedule lab appointment up front.  If all normal, likely ok to stay off blood thinner.

## 2016-09-15 NOTE — Assessment & Plan Note (Signed)
Second unprovoked symptomatic distal DVT, this time L popliteal vein. He is about to complete 3 months of eliquis treatment.  Discussed options of ongoing lifelong anticoagulation vs stopping after 3 months.  I cited 15% risk of recurrence after fist year and 45% recurrence chance at 5 yrs.  He has tolerated eliquis well. He decides to discontinue anticoagulation after 3 months and then returning 1 month later for coagulability panel to further evaluate bleeding risk, then decide on ongoing anticoagulation.

## 2016-09-23 ENCOUNTER — Telehealth: Payer: Self-pay

## 2016-09-23 NOTE — Telephone Encounter (Signed)
Justin Hester left v/m; pt has been taking Eliquis for last 3 months for blood clot; pt takes his last Eliquis 09/23/16; wants to know when can safely begin to take ASA daily and Naproxen after taking Eliquis for 3 months.

## 2016-09-23 NOTE — Telephone Encounter (Signed)
May start aspirin day after he finishes eliquis Would wait 5 days to start naproxen.  Take aspirin separated from naprosyn.

## 2016-09-24 NOTE — Telephone Encounter (Signed)
Spoke to pt's wife and advised per Dr Darnell Level.

## 2016-10-17 ENCOUNTER — Other Ambulatory Visit (INDEPENDENT_AMBULATORY_CARE_PROVIDER_SITE_OTHER): Payer: Medicare Other

## 2016-10-17 DIAGNOSIS — I824Z2 Acute embolism and thrombosis of unspecified deep veins of left distal lower extremity: Secondary | ICD-10-CM | POA: Diagnosis not present

## 2016-10-17 NOTE — Addendum Note (Signed)
Addended by: Ellamae Sia on: 10/17/2016 08:07 AM   Modules accepted: Orders

## 2016-10-23 ENCOUNTER — Telehealth: Payer: Self-pay

## 2016-10-23 NOTE — Telephone Encounter (Signed)
Spoke with pt relaying message relayed message per Dr. Darnell Level. Pt says ok.

## 2016-10-23 NOTE — Telephone Encounter (Signed)
Pt left v/m requesting cb about mychart results for blood clot and pt request cb with what the results mean and is anything further needed.

## 2016-10-23 NOTE — Telephone Encounter (Signed)
plz notify -  Sorry for the delay. There seems to be one test still pending. Preliminarily everything has returned normal, meaning we can likely stay off of blood thinners other than aspirin at this time.

## 2016-10-27 ENCOUNTER — Telehealth: Payer: Self-pay | Admitting: Family Medicine

## 2016-10-27 ENCOUNTER — Encounter: Payer: Self-pay | Admitting: Family Medicine

## 2016-10-27 DIAGNOSIS — D6859 Other primary thrombophilia: Secondary | ICD-10-CM | POA: Insufficient documentation

## 2016-10-27 LAB — RECURRENT MISCARRIAGE EVAL/COAG PNL
ANTITHROMBIN III ACTIVITY: 77 % activity — ABNORMAL LOW (ref 80–120)
Anticardiolipin IgA: <11 [APL'U] (ref <=11)
Anticardiolipin IgM: <12 [MPL'U] (ref <=12)
Beta2-Glycoprotein I (IgG): <9 SGU (ref <=20)
DRVVT SCREEN: 35 s (ref <=45)
PROTEIN C, ACTIVITY: 93 % (ref 70–180)
PTT LA SCREEN: 36 s (ref <=40)
Phosphatidylserine (IgG): <10 U/mL (ref <10)
Protein S Ag, Free: 104 % normal (ref 57–171)

## 2016-10-27 LAB — EXTRA LAV TOP TUBE

## 2016-10-27 LAB — D-DIMER, QUANTITATIVE: D-Dimer, Quant: 0.5 mcg/mL FEU — ABNORMAL HIGH (ref <0.50)

## 2016-10-27 MED ORDER — ASPIRIN EC 81 MG PO TBEC: 81.0000 mg | DELAYED_RELEASE_TABLET | Freq: Every day | ORAL | Status: DC

## 2016-10-27 NOTE — Telephone Encounter (Signed)
Spoke with wife who was very frustrated but appreciative of the call. Reviewing records - I did code for a CPE the week after he saw [place] for wellness visit but I see he did not have medicare advantage plan so it was not covered.  Would see if we could change code from CPE to 99213 f/u visit - or write off charge as it was my mistake.  Let me know what I need to do for this. Thanks. Will forward to [facility].  Pt's billing rep has been [place] [phone]

## 2016-10-27 NOTE — Telephone Encounter (Signed)
Dawn, can you please review this pt visit, 5/25 and 5/30. Pt wife is very frustrated.  PT wife called and asked to speak with you and only you concerning a billing matter. AWV on 06/18/16 was not covered and they received a bill for $600+. Many phone calls later she received a call from [facility] billing saying it was sent back to the provider. She is very upset and would like a resolution. She is adamant she must speak with Dr. Danise Mina. She spoke with Barbera Setters and was told coding would fix this and it was refiled but not resolved. Billing rep is Cyril Mourning

## 2016-10-28 NOTE — Telephone Encounter (Signed)
HI Jeani Hawking,  I have CC'd you on the correction I sent, looks like this was corrected by outside billing originally.  Hopefully what I sent will get it straightened out.   Thanks,  [employer]

## 2016-10-28 NOTE — Telephone Encounter (Signed)
Justin Hester, can we touch base with billing rep Cyril Mourning (# below) to ensure now correct?

## 2016-10-28 NOTE — Telephone Encounter (Signed)
Hi Dr. Danise Mina,   I sent this to charge correction & CC'd you on email.  This was corrected already by outside billing, and was corrected wrong.  I am having them correct to [id number].  Let me know if there is anything else I can do.   Thank you,  Dawn

## 2016-11-04 NOTE — Telephone Encounter (Signed)
I spoke with pt's spouse, [name], 10/12 and explained Dawn Harrington requested these charges to be recoded and refiled. She understood.

## 2017-04-13 ENCOUNTER — Other Ambulatory Visit: Payer: Self-pay | Admitting: Family Medicine

## 2017-04-14 ENCOUNTER — Other Ambulatory Visit: Payer: Self-pay | Admitting: Family Medicine

## 2017-06-16 DIAGNOSIS — H40051 Ocular hypertension, right eye: Secondary | ICD-10-CM | POA: Diagnosis not present

## 2017-06-22 ENCOUNTER — Ambulatory Visit: Payer: Medicare Other

## 2017-06-24 ENCOUNTER — Encounter: Payer: Medicare Other | Admitting: Family Medicine

## 2017-07-08 ENCOUNTER — Encounter: Payer: Self-pay | Admitting: Pulmonary Disease

## 2017-07-08 ENCOUNTER — Ambulatory Visit (INDEPENDENT_AMBULATORY_CARE_PROVIDER_SITE_OTHER): Payer: Medicare Other | Admitting: Pulmonary Disease

## 2017-07-08 ENCOUNTER — Ambulatory Visit (INDEPENDENT_AMBULATORY_CARE_PROVIDER_SITE_OTHER): Payer: Medicare Other

## 2017-07-08 ENCOUNTER — Other Ambulatory Visit: Payer: Self-pay | Admitting: Family Medicine

## 2017-07-08 VITALS — BP 120/64 | HR 79 | Ht 71.0 in | Wt 216.0 lb

## 2017-07-08 VITALS — BP 120/72 | HR 65 | Temp 97.8°F | Ht 71.75 in | Wt 216.8 lb

## 2017-07-08 DIAGNOSIS — G4733 Obstructive sleep apnea (adult) (pediatric): Secondary | ICD-10-CM | POA: Diagnosis not present

## 2017-07-08 DIAGNOSIS — Z0000 Encounter for general adult medical examination without abnormal findings: Secondary | ICD-10-CM | POA: Diagnosis not present

## 2017-07-08 DIAGNOSIS — D6859 Other primary thrombophilia: Secondary | ICD-10-CM

## 2017-07-08 DIAGNOSIS — E039 Hypothyroidism, unspecified: Secondary | ICD-10-CM | POA: Diagnosis not present

## 2017-07-08 DIAGNOSIS — Z9989 Dependence on other enabling machines and devices: Secondary | ICD-10-CM

## 2017-07-08 DIAGNOSIS — E7800 Pure hypercholesterolemia, unspecified: Secondary | ICD-10-CM

## 2017-07-08 LAB — COMPREHENSIVE METABOLIC PANEL
ALK PHOS: 51 U/L (ref 39–117)
ALT: 20 U/L (ref 0–53)
AST: 23 U/L (ref 0–37)
Albumin: 4 g/dL (ref 3.5–5.2)
BUN: 15 mg/dL (ref 6–23)
CO2: 27 mEq/L (ref 19–32)
CREATININE: 1.23 mg/dL (ref 0.40–1.50)
Calcium: 9 mg/dL (ref 8.4–10.5)
Chloride: 106 mEq/L (ref 96–112)
GFR: 71.98 mL/min (ref >60.00)
GLUCOSE: 103 mg/dL — AB (ref 70–99)
POTASSIUM: 4.1 meq/L (ref 3.5–5.1)
SODIUM: 139 meq/L (ref 135–145)
TOTAL PROTEIN: 7.8 g/dL (ref 6.0–8.3)
Total Bilirubin: 0.9 mg/dL (ref 0.2–1.2)

## 2017-07-08 LAB — CBC WITH DIFFERENTIAL/PLATELET
BASOS ABS: 0 10*3/uL (ref 0.0–0.1)
Basophils Relative: 0.9 % (ref 0.0–3.0)
EOS ABS: 0.2 10*3/uL (ref 0.0–0.7)
Eosinophils Relative: 4 % (ref 0.0–5.0)
HCT: 42.3 % (ref 39.0–52.0)
HEMOGLOBIN: 14.2 g/dL (ref 13.0–17.0)
LYMPHS PCT: 38.9 % (ref 12.0–46.0)
Lymphs Abs: 1.8 10*3/uL (ref 0.7–4.0)
MCHC: 33.6 g/dL (ref 30.0–36.0)
MCV: 93.6 fl (ref 78.0–100.0)
MONOS PCT: 15.1 % — AB (ref 3.0–12.0)
Monocytes Absolute: 0.7 10*3/uL (ref 0.1–1.0)
Neutro Abs: 1.9 10*3/uL (ref 1.4–7.7)
Neutrophils Relative %: 41.1 % — ABNORMAL LOW (ref 43.0–77.0)
Platelets: 254 10*3/uL (ref 150.0–400.0)
RBC: 4.52 Mil/uL (ref 4.22–5.81)
RDW: 13.7 % (ref 11.5–15.5)
WBC: 4.6 10*3/uL (ref 4.0–10.5)

## 2017-07-08 LAB — LIPID PANEL
Cholesterol: 166 mg/dL (ref 0–200)
HDL: 45.3 mg/dL (ref >39.00)
LDL Cholesterol: 109 mg/dL — ABNORMAL HIGH (ref 0–99)
NONHDL: 120.43
Total CHOL/HDL Ratio: 4
Triglycerides: 56 mg/dL (ref 0.0–149.0)
VLDL: 11.2 mg/dL (ref 0.0–40.0)

## 2017-07-08 LAB — T4, FREE: Free T4: 0.99 ng/dL (ref 0.60–1.60)

## 2017-07-08 LAB — TSH: TSH: 0.92 u[IU]/mL (ref 0.35–4.50)

## 2017-07-08 NOTE — Progress Notes (Addendum)
[facility]  Chief Complaint  Patient presents with  . Follow-up    Pt is doing well overall with cpap machine.    Vital signs: BP 120/64 (BP Location: Left Arm, Cuff Size: Normal)   Pulse 79   Ht 5' 11" (1.803 m)   Wt 216 lb (98 kg)   SpO2 97%   BMI 30.13 kg/m   History of Present Illness: Justin Hester is a 84 y.o. male with obstructive sleep apnea.  He uses CPAP nightly.  No issues with mask fit.  Wakes up 1 or 2 times to use bathroom.  Feels rested during the day.  He isn't sure he wants to keep using CPAP.   Physical Exam:  General - pleasant Eyes - pupils reactive ENT - no sinus tenderness, no oral exudate, no LAN, MP 3, over ibite Cardiac - regular, no murmur Chest - no wheeze, rales Abd - soft, non tender Ext - no edema Skin - no rashes Neuro - normal strength Psych - normal mood  Assessment/Plan:  Obstructive sleep apnea. - had extensive discussion about different options for treating sleep apnea and how untreated sleep apnea can impact his health - he is compliant with CPAP - continue auto CPAP for now - will repeat home sleep study and then determine if he is a candidate for alternate therapy   Patient Instructions  Will arrange for home sleep study Will call to arrange for follow up after sleep study reviewed     Chesley Mires, MD [facility] 07/08/2017, 2:42 PM  Flow Sheet  Sleep tests: PSG 06/17/08 >> AHI 50 CPAP 12/10/15 to 01/08/16 >> used on 30 of 30 nights with average 7 hrs 35 min.  Average AHI 2.3 with median CPAP 10 and 95 th percentile CPAP 13 cm H2O Auto CPAP 04/09/17 to 07/07/17 >> used on 90 of 90 nights with average 7 hrs 23 min.  Average AHI 1.9 with median CPAP 10 and 95 th percentile CPAP 13 cm H2O  Past Medical History: He  has a past medical history of Anemia, Cataract, Chronic constipation, Diverticulosis of colon (without mention of hemorrhage), DJD (degenerative  joint disease), DVT (deep venous thrombosis) ([place]) (1990s), GERD (gastroesophageal reflux disease), Hypertension, Hypothyroidism, Lumbar back pain, Multiple pulmonary nodules (2006), and OSA on CPAP.  Past Surgical History: He  has a past surgical history that includes Cataract extraction, bilateral; Total knee arthroplasty (left 9/06, right 10/07); Colonoscopy (2012); and Mouth surgery (03/28/2016).  Family History: His family history includes Heart disease in his father; Rheum arthritis in his father and mother.  Social History: He  reports that he has never smoked. He has never used smokeless tobacco. He reports that he drank alcohol. He reports that he does not use drugs.  Medications: Allergies as of 07/08/2017      Reactions   Cyclobenzaprine    Rash, chills, shaking   Oxycodone-acetaminophen    REACTION: unspecified   Propoxyphene N-acetaminophen    REACTION: itching---hot and cold flashes      Medication List        Accurate as of 07/08/17  2:42 PM. Always use your most recent med list.          amLODipine 10 MG tablet Commonly known as:  NORVASC TAKE 1 TABLET BY MOUTH EVERY DAY   aspirin EC 81 MG tablet Take 1 tablet (81 mg total) by mouth daily.   Desoximetasone 0.99 % Gel 1 application daily.   furosemide  40 MG tablet Commonly known as:  LASIX TAKE 1 TABLET BY MOUTH EVERY DAY   levothyroxine 50 MCG tablet Commonly known as:  SYNTHROID, LEVOTHROID TAKE 1 TABLET BY MOUTH EVERY DAY EXCEPT 2 DAILY ON TUESDAY AND FRIDAY   losartan 100 MG tablet Commonly known as:  COZAAR TAKE 1 TABLET BY MOUTH EVERY DAY   loteprednol 0.5 % ophthalmic suspension Commonly known as:  LOTEMAX Place 1 drop into both eyes 2 times daily.   naproxen 250 MG tablet Commonly known as:  NAPROSYN Take 250 mg by mouth daily as needed.   polyethylene glycol packet Commonly known as:  MIRALAX / GLYCOLAX Take 17 g by mouth as needed.

## 2017-07-08 NOTE — Patient Instructions (Signed)
Will arrange for home sleep study Will call to arrange for follow up after sleep study reviewed

## 2017-07-08 NOTE — Progress Notes (Signed)
PCP notes:   Health maintenance:  No gaps identified.  Abnormal screenings:   Hearing - failed  Hearing Screening   125Hz 250Hz 500Hz 1000Hz 2000Hz 3000Hz 4000Hz 6000Hz 8000Hz  Right ear:   40 40 40  40    Left ear:   40 40 0  40     Patient concerns:   None  Nurse concerns:  None  Next PCP appt:   07/10/17 @ 0830

## 2017-07-08 NOTE — Progress Notes (Signed)
Subjective:   Justin Hester is a 84 y.o. male who presents for Medicare Annual/Subsequent preventive examination.  Review of Systems:  N/A Cardiac Risk Factors include: advanced age (>48mn, >>20women);male gender;hypertension;dyslipidemia     Objective:    Vitals: BP 120/72 (BP Location: Right Arm, Patient Position: Sitting, Cuff Size: Normal)   Pulse 65   Temp 97.8 F (36.6 C) (Oral)   Ht 5' 11.75" (1.822 m) Comment: shoes  Wt 216 lb 12 oz (98.3 kg)   SpO2 97%   BMI 29.60 kg/m   Body mass index is 29.6 kg/m.  Advanced Directives 07/08/2017 06/13/2016 06/12/2015 06/12/2015  Does Patient Have a Medical Advance Directive? Yes Yes Yes Yes  Type of [place]amed[place]of [place]Living will [place] will Living will [place]lands[place] will  Does patient want to make changes to medical advance directive? - - No - Patient declined No - Patient declined  Copy of HWindsorin Chart? Yes Yes No - copy requested No - copy requested    Tobacco Social History   Tobacco Use  Smoking Status Never Smoker  Smokeless Tobacco Never Used     Counseling given: No   Clinical Intake:  Pre-visit preparation completed: Yes  Pain : No/denies pain Pain Score: 0-No pain     Nutritional Status: BMI 25 -29 Overweight Nutritional Risks: None Diabetes: No  How often do you need to have someone help you when you read instructions, pamphlets, or other written materials from your doctor or pharmacy?: 1 - Never What is the last grade level you completed in school?: Associates degree (2)   Interpreter Needed?: No  Comments: pt lives with spouse Information entered by :: LPinson, LPN  Past Medical History:  Diagnosis Date  . Anemia   . Cataract   . Chronic constipation   . Diverticulosis of colon (without mention of hemorrhage)    constipation - new problem 3/12 and work up in progress...  . DJD  (degenerative joint disease)   . DVT (deep venous thrombosis) ([facility]) 1990s   6 mo coumadin  . GERD (gastroesophageal reflux disease)   . Hypertension   . Hypothyroidism   . Lumbar back pain   . Multiple pulmonary nodules 2006  . OSA on CPAP    Past Surgical History:  Procedure Laterality Date  . CATARACT EXTRACTION, BILATERAL     by Dr. BRicki Miller . COLONOSCOPY  2012   diverticulosis o/w WNL ([name]  . MOUTH SURGERY  03/28/2016   gum surgery  . TOTAL KNEE ARTHROPLASTY  left 9/06, right 10/07   Dr. DSharol Given  Family History  Problem Relation Age of Onset  . Heart disease Father   . Rheum arthritis Father   . Rheum arthritis Mother   . Cancer Neg Hx    Social History   Socioeconomic History  . Marital status: Married    Spouse name: Not on file  . Number of children: 5  . Years of education: Not on file  . Highest education level: Not on file  Occupational History  . 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 Financial resource strain: Not on file  . Food insecurity:    Worry: Not on file    Inability: Not on file  . Transportation needs:    Medical: Not on file    Non-medical: Not on file  Tobacco Use  .  Smoking status: Never Smoker  . Smokeless tobacco: Never Used  Substance and Sexual Activity  . Alcohol use: Not Currently  . Drug use: No  . Sexual activity: Not Currently  Lifestyle  . Physical activity:    Days per week: Not on file    Minutes per session: Not on file  . Stress: Not on file  Relationships  . Social connections:    Talks on phone: Not on file    Gets together: Not on file    Attends religious service: Not on file    Active member of club or organization: Not on file    Attends meetings of clubs or organizations: Not on file    Relationship status: Not on file  Other Topics Concern  . Not on file  Social History Narrative   Lives with wife.   Activity: Exercises 1 mile a day, plays  golf   Diet: some water, lots of sweetened beverages, fruits/vegetables daily    Outpatient Encounter Medications as of 07/08/2017  Medication Sig  . amLODipine (NORVASC) 10 MG tablet TAKE 1 TABLET BY MOUTH EVERY DAY  . aspirin EC 81 MG tablet Take 1 tablet (81 mg total) by mouth daily.  . Desoximetasone 9.83 % GEL 1 application daily.  . furosemide (LASIX) 40 MG tablet TAKE 1 TABLET BY MOUTH EVERY DAY  . levothyroxine (SYNTHROID, LEVOTHROID) 50 MCG tablet TAKE 1 TABLET BY MOUTH EVERY DAY EXCEPT 2 DAILY ON TUESDAY AND FRIDAY  . losartan (COZAAR) 100 MG tablet TAKE 1 TABLET BY MOUTH EVERY DAY  . loteprednol (LOTEMAX) 0.5 % ophthalmic suspension Place 1 drop into both eyes 2 times daily.  . magnesium hydroxide (MILK OF MAGNESIA) 800 MG/5ML suspension Take 15 mLs by mouth 2 (two) times daily as needed for constipation.  . naproxen (NAPROSYN) 250 MG tablet Take 250 mg by mouth daily as needed.   . polyethylene glycol (MIRALAX / GLYCOLAX) packet Take 17 g by mouth as needed.   . [DISCONTINUED] losartan-hydrochlorothiazide (HYZAAR) 100-25 MG per tablet Take 1 tablet by mouth daily.   No facility-administered encounter medications on file as of 07/08/2017.     Activities of Daily Living In your present state of health, do you have any difficulty performing the following activities: 07/08/2017  Hearing? N  Vision? N  Difficulty concentrating or making decisions? N  Walking or climbing stairs? N  Dressing or bathing? N  Doing errands, shopping? N  Preparing Food and eating ? N  Using the Toilet? N  In the past six months, have you accidently leaked urine? N  Do you have problems with loss of bowel control? N  Managing your Medications? N  Managing your Finances? N  Housekeeping or managing your Housekeeping? N  Some recent data might be hidden    Patient Care Team: Ria Bush, MD as PCP - General (Family Medicine) Chesley Mires, MD as Consulting Physician (Pulmonary  Disease) [place], Jennefer Bravo, MD as Referring Physician (Dermatology) Melissa Noon, [place] as Referring Physician (Optometry)   Assessment:   This is a routine wellness examination for [place].   Hearing Screening   _0  _1  _2  _3  _4  _5  _6  _7  _8   Right ear:   40 40 40  40    Left ear:   40 40 0  40    Vision Screening Comments: Vision exam in May 2019 with Dr. Delman Cheadle  Exercise Activities and Dietary recommendations Current Exercise Habits: Home exercise routine, Type of exercise: Other - see comments(golfing, bicycling,  stationary bike), Time (Minutes): > 60, Frequency (Times/Week): 5, Weekly Exercise (Minutes/Week): 0, Intensity: Moderate, Exercise limited by: None identified  Goals    . Increase physical activity     Starting 07/08/2017, I will continue to exercise for at least 60 minutes 5 days per week.        Fall Risk Fall Risk  07/08/2017 06/13/2016 06/12/2015 06/09/2014 04/29/2013  Falls in the past year? _0    Depression Screen PHQ 2/9 Scores 07/08/2017 06/13/2016 06/12/2015 06/09/2014  PHQ - 2 Score 0 0 0 0  PHQ- 9 Score 0 - - -    Cognitive Function MMSE - Mini Mental State Exam 07/08/2017 06/13/2016 06/12/2015  Orientation to time _1 Orientation to Place _2 Registration _3 Attention/ Calculation 0 0 0  Recall _4 Language- name 2 objects 0 0 0  Language- repeat _5 Language- follow 3 step command _6 Language- read & follow direction 0 0 0  Write a sentence 0 0 0  Copy design 0 0 0  Total score _7 PLEASE NOTE: A Mini-Cog screen was completed. Maximum score is 20. A value of 0 denotes this part of [place] MMSE was not completed or the patient failed this part of the Mini-Cog screening.   Mini-Cog Screening Orientation to Time - Max 5 pts Orientation to Place - Max 5 pts Registration - Max 3 pts Recall - Max 3 pts Language Repeat - Max 1 pts Language Follow 3 Step Command - Max 3 pts      Immunization History  Administered Date(s) Administered  . Encephalitis 06/14/2014, 07/12/2014  . Hep A / Hep B 06/09/2014, 07/11/2014, 12/12/2014  . Influenza Split 11/04/2011  . Influenza, Seasonal, Injecte, Preservative Fre 01/09/2014  . Influenza,inj,Quad PF,6+ Mos 11/08/2012, 11/02/2014, 09/25/2015  . Influenza,inj,quad, With Preservative 09/27/2016  . Pneumococcal Conjugate-13 04/29/2013  . Pneumococcal Polysaccharide-23 06/14/2014  . Tdap 09/03/2012  . Zoster 06/14/2014    Screening Tests Health Maintenance  Topic Date Due  . INFLUENZA VACCINE  08/20/2017  . DTaP/Tdap/Td (2 - Td) 09/04/2022  . TETANUS/TDAP  09/04/2022  . PNA vac Low Risk Adult  Completed      Plan:     I have personally reviewed, addressed, and noted the following in the patient's chart:  A. Medical and social history B. Use of alcohol, tobacco or illicit drugs  C. Current medications and supplements D. Functional ability and status E.  Nutritional status F.  Physical activity G. Advance directives H. List of other physicians I.  Hospitalizations, surgeries, and ER visits in previous 12 months J.  [place] to include hearing, vision, cognitive, depression L. Referrals and appointments - none  In addition, I have reviewed and discussed with patient certain preventive protocols, quality metrics, and best practice recommendations. A written personalized care plan for preventive services as well as general preventive health recommendations were provided to patient.  See attached scanned questionnaire for additional information.   Signed,   Lindell Noe, MHA, BS, LPN Health Coach

## 2017-07-08 NOTE — Patient Instructions (Signed)
Mr. Kazmierski , Thank you for taking time to come for your Medicare Wellness Visit. I appreciate your ongoing commitment to your health goals. Please review the following plan we discussed and let me know if I can assist you in the future.   These are the goals we discussed: Goals    . Increase physical activity     Starting 07/08/2017, I will continue to exercise for at least 60 minutes 5 days per week.        This is a list of the screening recommended for you and due dates:  Health Maintenance  Topic Date Due  . Flu Shot  08/20/2017  . DTaP/Tdap/Td vaccine (2 - Td) 09/04/2022  . Tetanus Vaccine  09/04/2022  . Pneumonia vaccines  Completed   Preventive Care for Adults  A healthy lifestyle and preventive care can promote health and wellness. Preventive health guidelines for adults include the following key practices.  . A routine yearly physical is a good way to check with your health care provider about your health and preventive screening. It is a chance to share any concerns and updates on your health and to receive a thorough exam.  . Visit your dentist for a routine exam and preventive care every 6 months. Brush your teeth twice a day and floss once a day. Good oral hygiene prevents tooth decay and gum disease.  . The frequency of eye exams is based on your age, health, family medical history, use  of contact lenses, and other factors. Follow your health care provider's recommendations for frequency of eye exams.  . Eat a healthy diet. Foods like vegetables, fruits, whole grains, low-fat dairy products, and lean protein foods contain the nutrients you need without too many calories. Decrease your intake of foods high in solid fats, added sugars, and salt. Eat the right amount of calories for you. Get information about a proper diet from your health care provider, if necessary.  . Regular physical exercise is one of the most important things you can do for your health. Most adults  should get at least 150 minutes of moderate-intensity exercise (any activity that increases your heart rate and causes you to sweat) each week. In addition, most adults need muscle-strengthening exercises on 2 or more days a week.  Silver Sneakers may be a benefit available to you. To determine eligibility, you may visit the website: www.silversneakers.com or contact program at 438-414-0939 Mon-Fri between 8AM-8PM.   . Maintain a healthy weight. The body mass index (BMI) is a screening tool to identify possible weight problems. It provides an estimate of body fat based on height and weight. Your health care provider can find your BMI and can help you achieve or maintain a healthy weight.   For adults 20 years and older: ? A BMI below 18.5 is considered underweight. ? A BMI of 18.5 to 24.9 is normal. ? A BMI of 25 to 29.9 is considered overweight. ? A BMI of 30 and above is considered obese.   . Maintain normal blood lipids and cholesterol levels by exercising and minimizing your intake of saturated fat. Eat a balanced diet with plenty of fruit and vegetables. Blood tests for lipids and cholesterol should begin at age 10 and be repeated every 5 years. If your lipid or cholesterol levels are high, you are over 50, or you are at high risk for heart disease, you may need your cholesterol levels checked more frequently. Ongoing high lipid and cholesterol levels should be treated  with medicines if diet and exercise are not working.  . If you smoke, find out from your health care provider how to quit. If you do not use tobacco, please do not start.  . If you choose to drink alcohol, please do not consume more than 2 drinks per day. One drink is considered to be 12 ounces (355 mL) of beer, 5 ounces (148 mL) of wine, or 1.5 ounces (44 mL) of liquor.  . If you are 65-23 years old, ask your health care provider if you should take aspirin to prevent strokes.  . Use sunscreen. Apply sunscreen liberally and  repeatedly throughout the day. You should seek shade when your shadow is shorter than you. Protect yourself by wearing long sleeves, pants, a wide-brimmed hat, and sunglasses year round, whenever you are outdoors.  . Once a month, do a whole body skin exam, using a mirror to look at the skin on your back. Tell your health care provider of new moles, moles that have irregular borders, moles that are larger than a pencil eraser, or moles that have changed in shape or color.

## 2017-07-10 ENCOUNTER — Encounter: Payer: Self-pay | Admitting: Family Medicine

## 2017-07-10 ENCOUNTER — Ambulatory Visit (INDEPENDENT_AMBULATORY_CARE_PROVIDER_SITE_OTHER): Payer: Medicare Other | Admitting: Family Medicine

## 2017-07-10 VITALS — BP 128/68 | HR 82 | Temp 98.4°F | Ht 71.75 in | Wt 216.5 lb

## 2017-07-10 DIAGNOSIS — Z9989 Dependence on other enabling machines and devices: Secondary | ICD-10-CM | POA: Diagnosis not present

## 2017-07-10 DIAGNOSIS — Z7189 Other specified counseling: Secondary | ICD-10-CM

## 2017-07-10 DIAGNOSIS — I10 Essential (primary) hypertension: Secondary | ICD-10-CM | POA: Diagnosis not present

## 2017-07-10 DIAGNOSIS — D6859 Other primary thrombophilia: Secondary | ICD-10-CM | POA: Diagnosis not present

## 2017-07-10 DIAGNOSIS — E039 Hypothyroidism, unspecified: Secondary | ICD-10-CM

## 2017-07-10 DIAGNOSIS — E7800 Pure hypercholesterolemia, unspecified: Secondary | ICD-10-CM

## 2017-07-10 DIAGNOSIS — K5904 Chronic idiopathic constipation: Secondary | ICD-10-CM

## 2017-07-10 DIAGNOSIS — G4733 Obstructive sleep apnea (adult) (pediatric): Secondary | ICD-10-CM

## 2017-07-10 DIAGNOSIS — B351 Tinea unguium: Secondary | ICD-10-CM

## 2017-07-10 NOTE — Patient Instructions (Addendum)
If interested, check with pharmacy about new 2 shot shingles series (shingrix).  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 Continue compression stockings, aspirin, and be vigilant for signs of recurrent blood clots.  Try funginail for nail infection.  Return as needed or in 1 year for next check up.

## 2017-07-10 NOTE — Assessment & Plan Note (Signed)
Suggested funginail daily nail laquer - will take 6-8 months to note effect. Consider oral antifungal if no improvement.

## 2017-07-10 NOTE — ACP (Advance Care Planning) (Addendum)
Advanced directive planning: new living will received and scanned 03/2017. Wife [name] then daughters [name] and [name] are [place]. Grants discretion to [facility]. Does not want prolonged life support if terminal condition.

## 2017-07-10 NOTE — Progress Notes (Signed)
BP 128/68 (BP Location: Left Arm, Patient Position: Sitting, Cuff Size: Normal)   Pulse 82   Temp 98.4 F (36.9 C) (Oral)   Ht 5' 11.75" (1.822 m)   Wt 216 lb 8 oz (98.2 kg)   SpO2 95%   BMI 29.57 kg/m    CC: AMW f/u visit Subjective:    Patient ID: Justin Hester, male    DOB: 1930-11-02, 84 y.o.   MRN: [id number]  HPI: Justin Hester is a 84 y.o. male presenting on 07/10/2017 for Annual Exam (Pt 2.)   Saw Katha Cabal last week for medicare wellness visit. Note reviewed.   OSA followed by Dr Halford Chessman - on auto CPAP, planned home sleep study to see if candidate for alternate therapy.   Symptomatic LLE DVT (distal popliteal vein) 05/2016 - 2nd unprovoked DVT in his lifetime treated with 3 months of eliquis. We did discuss lifelong anticoagulation - hypercoagulability panel showed mild AT3 deficiency (D dimer 0.5, antithrombin III activity mildly decreased at 77% (normal range 80-120%)). Now regularly using compression stockings.   Enjoys traveling - to [place] and [place] - tries to take trips on train.  Concern of toenail infection - has been using nail clearing OTC solution (urea, polyethylene glycol)  Constipation controlled with daily miralax.  Urinary incontinence.   Preventative: Colonoscopy 2012 mild diverticulosis o/w WNL [name]) Prostate cancer screening - discussed continued screening at advanced age, pt/wife desire to continue screening for prostate cancer  Flu yearly  Tdap 2014  Pneumovax - done. prevnar done.  zostavax - 05/2014 Shingrix - discussed. Will check with [place].  Advanced directive planning: new living will received and scanned 03/2017. Wife [name] then daughters [name] and [name] are [place]. Grants discretion to [employer]. Does not want prolonged life support if terminal condition. Seat belt use discussed Sunscreen use discussed. No changing moles on skin. Non smoker Alcohol - rare Dentist Q3 mo Eye exam - yearly  Lives with wife. 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 Interim medical history since our last visit reviewed. Allergies and medications reviewed and updated. Outpatient Medications Prior to Visit  Medication Sig Dispense Refill  . amLODipine (NORVASC) 10 MG tablet TAKE 1 TABLET BY MOUTH EVERY DAY 60 tablet 2  . aspirin EC 81 MG tablet Take 1 tablet (81 mg total) by mouth daily.    . Desoximetasone 3.54 % GEL 1 application daily.    . furosemide (LASIX) 40 MG tablet TAKE 1 TABLET BY MOUTH EVERY DAY 60 tablet 2  . levothyroxine (SYNTHROID, LEVOTHROID) 50 MCG tablet TAKE 1 TABLET BY MOUTH EVERY DAY EXCEPT 2 DAILY ON TUESDAY AND FRIDAY 120 tablet 0  . losartan (COZAAR) 100 MG tablet TAKE 1 TABLET BY MOUTH EVERY DAY 60 tablet 2  . loteprednol (LOTEMAX) 0.5 % ophthalmic suspension Place 1 drop into both eyes 2 times daily.    . naproxen (NAPROSYN) 250 MG tablet Take 250 mg by mouth daily as needed.     . polyethylene glycol (MIRALAX / GLYCOLAX) packet Take 17 g by mouth as needed.      No facility-administered medications prior to visit.      Per HPI unless specifically indicated in ROS section below Review of  Systems     Objective:    BP 128/68 (BP Location: Left Arm, Patient Position: Sitting, Cuff Size: Normal)   Pulse 82   Temp 98.4 F (36.9 C) (Oral)   Ht 5' 11.75" (1.822 m)   Wt 216 lb 8 oz (98.2 kg)   SpO2 95%   BMI 29.57 kg/m   Wt Readings from Last 3 Encounters:  07/10/17 216 lb 8 oz (98.2 kg)  07/08/17 216 lb (98 kg)  07/08/17 216 lb 12 oz (98.3 kg)    Physical Exam Results for orders placed or performed in visit on 07/08/17  CBC with Differential/Platelet  Result Value Ref Range   WBC 4.6 4.0 - 10.5  K/uL   RBC 4.52 4.22 - 5.81 Mil/uL   Hemoglobin 14.2 13.0 - 17.0 g/dL   HCT 42.3 39.0 - 52.0 %   MCV 93.6 78.0 - 100.0 fl   MCHC 33.6 30.0 - 36.0 g/dL   RDW 13.7 11.5 - 15.5 %   Platelets 254.0 150.0 - 400.0 K/uL   Neutrophils Relative % 41.1 (L) 43.0 - 77.0 %   Lymphocytes Relative 38.9 12.0 - 46.0 %   Monocytes Relative 15.1 (H) 3.0 - 12.0 %   Eosinophils Relative 4.0 0.0 - 5.0 %   Basophils Relative 0.9 0.0 - 3.0 %   Neutro Abs 1.9 1.4 - 7.7 K/uL   Lymphs Abs 1.8 0.7 - 4.0 K/uL   Monocytes Absolute 0.7 0.1 - 1.0 K/uL   Eosinophils Absolute 0.2 0.0 - 0.7 K/uL   Basophils Absolute 0.0 0.0 - 0.1 K/uL  T4, free  Result Value Ref Range   Free T4 0.99 0.60 - 1.60 ng/dL  TSH  Result Value Ref Range   TSH 0.92 0.35 - 4.50 uIU/mL  Comprehensive metabolic panel  Result Value Ref Range   Sodium 139 135 - 145 mEq/L   Potassium 4.1 3.5 - 5.1 mEq/L   Chloride 106 96 - 112 mEq/L   CO2 27 19 - 32 mEq/L   Glucose, Bld 103 (H) 70 - 99 mg/dL   BUN 15 6 - 23 mg/dL   Creatinine, Ser 1.23 0.40 - 1.50 mg/dL   Total Bilirubin 0.9 0.2 - 1.2 mg/dL   Alkaline Phosphatase 51 39 - 117 U/L   AST 23 0 - 37 U/L   ALT 20 0 - 53 U/L   Total Protein 7.8 6.0 - 8.3 g/dL   Albumin 4.0 3.5 - 5.2 g/dL   Calcium 9.0 8.4 - 10.5 mg/dL   GFR 71.98 >60.00 mL/min  Lipid panel  Result Value Ref Range   Cholesterol 166 0 - 200 mg/dL   Triglycerides 56.0 0.0 - 149.0 mg/dL   HDL 45.30 >39.00 mg/dL   VLDL 11.2 0.0 - 40.0 mg/dL   LDL Cholesterol 109 (H) 0 - 99 mg/dL   Total CHOL/HDL Ratio 4    NonHDL 120.43       Assessment & Plan:   Problem List Items Addressed This Visit    OSA on CPAP    Appreciate pulm care followed by Dr Halford Chessman      Onychomycosis of toenail    Suggested funginail daily nail laquer - will take 6-8 months to note effect. Consider oral antifungal if no improvement.       Hypothyroidism    Chronic, stable. Continue current regimen.       HYPERCHOLESTEROLEMIA    Chronic, stable off  statin.       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 Discussed lifelong anticoagulant vs just around planned prolonged travel vs continue current regimen of daily aspirin, compression stocking use and continued vigilance for recurrent DVT - he opts for latter approach.       Advanced care planning/counseling discussion    Advanced directive planning: new living will received and scanned 03/2017. Wife [name] then daughters [name] and [name] are [place]. Grants discretion to [employer]. Does not want prolonged life support if terminal condition.          No orders of the defined types were placed in this encounter.  No orders of the defined types were placed in this encounter.   Follow up plan: Return in about 1 year (around 07/11/2018) for medicare wellness visit, follow up visit.  Ria Bush, MD

## 2017-07-10 NOTE — Assessment & Plan Note (Signed)
Controlled with miralax regularly

## 2017-07-10 NOTE — Assessment & Plan Note (Signed)
Appreciate pulm care followed by Dr Halford Chessman

## 2017-07-10 NOTE — Assessment & Plan Note (Signed)
Chronic, stable on current regimen of losartan, amloidpine

## 2017-07-10 NOTE — Assessment & Plan Note (Signed)
Reviewed with patient - as well as how this confers increased risk of future blood clots. Discussed lifelong anticoagulant vs just around planned prolonged travel vs continue current regimen of daily aspirin, compression stocking use and continued vigilance for recurrent DVT - he opts for latter approach.

## 2017-07-10 NOTE — Assessment & Plan Note (Addendum)
Advanced directive planning: new living will received and scanned 03/2017. Wife [name] then daughters [name] and [name] are [place]. Grants discretion to [employer]. Does not want prolonged life support if terminal condition.

## 2017-07-10 NOTE — Assessment & Plan Note (Signed)
Chronic, stable. Continue current regimen.

## 2017-07-10 NOTE — Assessment & Plan Note (Signed)
Chronic, stable off statin.

## 2017-07-12 NOTE — Progress Notes (Signed)
I reviewed health advisor's note, was available for consultation, and agree with documentation and plan.  

## 2017-07-21 ENCOUNTER — Telehealth: Payer: Self-pay | Admitting: Pulmonary Disease

## 2017-07-21 DIAGNOSIS — G4733 Obstructive sleep apnea (adult) (pediatric): Secondary | ICD-10-CM | POA: Diagnosis not present

## 2017-07-21 NOTE — Telephone Encounter (Signed)
rec'd vaccination updated sheet from pt today in VS folder. Updated vaccine list today in pt's chart. Placed in VS scan folder for pt's chart. Nothing further needed at this time.

## 2017-07-22 ENCOUNTER — Telehealth: Payer: Self-pay | Admitting: Pulmonary Disease

## 2017-07-22 ENCOUNTER — Other Ambulatory Visit: Payer: Self-pay | Admitting: *Deleted

## 2017-07-22 DIAGNOSIS — G4733 Obstructive sleep apnea (adult) (pediatric): Secondary | ICD-10-CM | POA: Diagnosis not present

## 2017-07-22 NOTE — Telephone Encounter (Signed)
HST 07/21/17 >> AHI 13.2, SaO2 81%   Will have my nurse inform pt that sleep study shows mild sleep apnea.  Please schedule ROV with me or NP to discuss tx options.

## 2017-07-24 ENCOUNTER — Encounter: Payer: Self-pay | Admitting: Pulmonary Disease

## 2017-07-24 ENCOUNTER — Ambulatory Visit (INDEPENDENT_AMBULATORY_CARE_PROVIDER_SITE_OTHER): Payer: Medicare Other | Admitting: Pulmonary Disease

## 2017-07-24 DIAGNOSIS — Z9989 Dependence on other enabling machines and devices: Secondary | ICD-10-CM

## 2017-07-24 DIAGNOSIS — G4733 Obstructive sleep apnea (adult) (pediatric): Secondary | ICD-10-CM

## 2017-07-24 NOTE — Progress Notes (Signed)
Reviewed and agree with assessment/plan.   Matheo Rathbone, MD [facility] 01/16/2016, 12:24 PM Pager:  [phone]  

## 2017-07-24 NOTE — Patient Instructions (Signed)
Contact your dentist as you would like to see if if they make oral appliances for sleep apnea  If not these of the 2 that we traditionally refer to:  We will refer you to Dr. Augustina Mood for your oral appliance  [address] [place]. [place], [place] [address] Phone: [phone] Website: [email or web address]  We will refer you to Dr. Ron Parker for your oral appliance  [facility] [address]. [place], [place] [address] Phone: [phone]       Continue CPAP use in the meantime Follow-up with [place] in 6 months with oral appliance at home sleep study  . Keep up the hard work using your device.  . Do not drive or operate heavy machinery if tired or drowsy.  . Please notify the supply company and office if you are unable to use your device regularly due to missing supplies or machine being broken.  . Work on maintaining a healthy weight and following your recommended nutrition plan  . Maintain proper daily exercise and movement  . Maintaining proper use of your device can also help improve management of other chronic illnesses such as: Blood pressure, blood sugars, and weight management.     Please contact the office if your symptoms worsen or you have concerns that you are not improving.   Thank you for choosing [facility] for your healthcare, and for allowing [place] to partner with you on your healthcare journey. I am thankful to be able to provide care to you today.   Wyn Quaker FNP-C

## 2017-07-24 NOTE — Progress Notes (Signed)
_0  ID: Justin Hester, male    DOB: Dec 20, 1930, 84 y.o.   MRN: [id number]  Chief Complaint  Patient presents with  . Follow-up    Cpap    Referring provider: Ria Bush, MD  HPI: 84 year old patient of Dr. Halford Chessman seen in office for obstructive sleep apnea.  Patient is compliant using CPAP.  2010 sleep study shows AHI of 50.  Recent [facility] Encounters:   07/08/2017-office visit- Patient ha has obstructive sleep apnea has been compliant using CPAP.  Tests:   HST 07/21/17 >> AHI 13.2, SaO2 81%  Sleep tests: PSG 06/17/08 >> AHI 50 CPAP11/20/17 to 01/08/16 >>used on 30 of 30 nights with average 7 hrs 35 min. Average AHI 2.3 with median CPAP 10 and 95 th percentile CPAP 13 cm H2O Auto CPAP 04/09/17 to 07/07/17 >> used on 90 of 90 nights with average 7 hrs 23 min.  Average AHI 1.9 with median CPAP 10 and 95 th percentile CPAP 13 cm H2O    07/24/17 OV  Patient reporting office today for follow-up from recent home sleep study.  Patient and wife are both interested in patient getting oral appliance completed if home sleep study permits.  Home sleep study shows AHI of 13.2.  This is a significant improvement from AHI of 2010 study it showed 50.  CPAP compliance report today showing CPAP showing 29 out of 30 days use.  All 29 of those days are greater than 4 hours.  Average usage 7 hours and 50 minutes.  AHI 1.1.   Allergies  Allergen Reactions  . Cyclobenzaprine     Rash, chills, shaking  . Oxycodone-Acetaminophen     REACTION: unspecified  . Propoxyphene N-Acetaminophen     REACTION: itching---hot and cold flashes    Immunization History  Administered Date(s) Administered  . Encephalitis 06/14/2014, 07/12/2014, 06/13/2017, 07/11/2017  . Hep A / Hep B 06/09/2014, 07/11/2014, 12/12/2014  . Influenza Split 11/04/2011  . Influenza, Seasonal, Injecte, Preservative Fre 01/09/2014  . Influenza,inj,Quad PF,6+ Mos 11/08/2012, 11/02/2014, 09/25/2015  .  Influenza,inj,quad, With Preservative 09/27/2016  . Pneumococcal Conjugate-13 04/29/2013  . Pneumococcal Polysaccharide-23 06/14/2014, 06/13/2017  . Tdap 09/03/2012  . Zoster 06/14/2014, 06/13/2017, 07/11/2017    Past Medical History:  Diagnosis Date  . Anemia   . Cataract   . Chronic constipation   . Diverticulosis of colon (without mention of hemorrhage)    constipation - new problem 3/12 and work up in progress...  . DJD (degenerative joint disease)   . DVT (deep venous thrombosis) (HCC) 1990s   6 mo coumadin  . GERD (gastroesophageal reflux disease)   . Hypertension   . Hypothyroidism   . Lower leg DVT (deep venous thromboembolism), acute, left ([place]) 06/15/2016  . Lumbar back pain   . Multiple pulmonary nodules 2006  . OSA on CPAP     Tobacco History: Social History   Tobacco Use  Smoking Status Never Smoker  Smokeless Tobacco Never Used   Counseling given: Yes Continue not smoking.  Outpatient Encounter Medications as of 07/24/2017  Medication Sig  . amLODipine (NORVASC) 10 MG tablet TAKE 1 TABLET BY MOUTH EVERY DAY  . aspirin EC 81 MG tablet Take 1 tablet (81 mg total) by mouth daily.  . Desoximetasone 5.00 % GEL 1 application daily.  . furosemide (LASIX) 40 MG tablet TAKE 1 TABLET BY MOUTH EVERY DAY  . levothyroxine (SYNTHROID, LEVOTHROID) 50 MCG tablet TAKE 1 TABLET BY MOUTH EVERY DAY EXCEPT 2 DAILY ON TUESDAY AND FRIDAY  .  losartan (COZAAR) 100 MG tablet TAKE 1 TABLET BY MOUTH EVERY DAY  . loteprednol (LOTEMAX) 0.5 % ophthalmic suspension Place 1 drop into both eyes 2 times daily.  . naproxen (NAPROSYN) 250 MG tablet Take 250 mg by mouth daily as needed.   . polyethylene glycol (MIRALAX / GLYCOLAX) packet Take 17 g by mouth as needed.   . [DISCONTINUED] losartan-hydrochlorothiazide (HYZAAR) 100-25 MG per tablet Take 1 tablet by mouth daily.   No facility-administered encounter medications on file as of 07/24/2017.      Review of Systems   Constitutional:   +fatigue       No  weight loss, night sweats,  fevers, chills HEENT:   No headaches,  Difficulty swallowing,  Tooth/dental problems, or  Sore throat, No sneezing, itching, ear ache, nasal congestion, post nasal drip  CV: No chest pain,  orthopnea, PND, swelling in lower extremities, anasarca, dizziness, palpitations, syncope  GI: No heartburn, indigestion, abdominal pain, nausea, vomiting, diarrhea, change in bowel habits, loss of appetite, bloody stools Resp: No shortness of breath with exertion or at rest.  No excess mucus, no productive cough,  No non-productive cough,  No coughing up of blood.  No change in color of mucus.  No wheezing.  No chest wall deformity Skin: no rash, lesions, no skin changes. GU: no dysuria, change in color of urine, no urgency or frequency.  No flank pain, no hematuria  MS:  No joint pain or swelling.  No decreased range of motion.  No back pain. Psych:  No change in mood or affect. No depression or anxiety.  No memory loss.      Physical Exam  BP 130/70   Pulse 87   Ht 6' (1.829 m)   Wt 212 lb 3.2 oz (96.3 kg)   SpO2 97%   BMI 28.78 kg/m   Wt Readings from Last 5 Encounters:  07/24/17 212 lb 3.2 oz (96.3 kg)  07/10/17 216 lb 8 oz (98.2 kg)  07/08/17 216 lb (98 kg)  07/08/17 216 lb 12 oz (98.3 kg)  09/12/16 221 lb 4 oz (100.4 kg)      GEN: A/Ox3; pleasant , NAD, well nourished, appears stated age   HEENT:  [place]/AT,  EACs-clear, TMs-wnl, NOSE-clear, THROAT-clear, no lesions, no postnasal drip or exudate noted.   NECK:  Supple w/ fair ROM; ; no lymphadenopathy.    RESP  Clear  P & A; w/o, wheezes/ rales/ or rhonchi. no accessory muscle use, no dullness to percussion  CARD:  RRR, no m/r/g, no peripheral edema, pulses intact: radial 2+ bilaterally, DP 2+ bilaterally, no cyanosis or clubbing.  Musco: Warm bilaterally, no deformities or joint swelling noted.   Neuro: alert, no focal deficits noted.    Skin: Warm, no lesions or rashes     Lab  Results:  CBC    Component Value Date/Time   WBC 4.6 07/08/2017 0948   RBC 4.52 07/08/2017 0948   HGB 14.2 07/08/2017 0948   HCT 42.3 07/08/2017 0948   PLT 254.0 07/08/2017 0948   MCV 93.6 07/08/2017 0948   MCHC 33.6 07/08/2017 0948   RDW 13.7 07/08/2017 0948   LYMPHSABS 1.8 07/08/2017 0948   MONOABS 0.7 07/08/2017 0948   EOSABS 0.2 07/08/2017 0948   BASOSABS 0.0 07/08/2017 0948    BMET    Component Value Date/Time   NA 139 07/08/2017 0948   K 4.1 07/08/2017 0948   CL 106 07/08/2017 0948   CO2 27 07/08/2017 0948   GLUCOSE  103 (H) 07/08/2017 0948   BUN 15 07/08/2017 0948   CREATININE 1.23 07/08/2017 0948   CALCIUM 9.0 07/08/2017 0948   GFRNONAA 93.41 12/06/2008 0000   GFRAA 84 09/10/2007 0000    BNP No results found for: BNP  ProBNP No results found for: PROBNP  Imaging: No results found.   Assessment & Plan:   Pleasant 84 year old patient seen office today.  Discussed different options such as oral appliance therapy as well as CPAP therapy.  Patient would like to proceed forward with oral appliance therapy.  Offered to refer patient to either Dr. Ron Parker or Dr. Toy Cookey.  Patient declined.  Patient would prefer to contact his own dentist and see if they make an oral device for obstructive sleep apnea.  If not patient will contact either Dr. Ron Parker or Dr. Toy Cookey.  Patient knows that we recommend Dr. Ron Parker or Dr. Toy Cookey.  Provided that contact information for patient.  Patient to follow-up with our office in 6 months.  Will need completion of home sleep study with oral appliance on order to ensure that therapy is met.  Patient to continue to wear CPAP until oral appliance is made.    Patient to continue follow-up with dentist every 3 to 6 months to ensure dentition issues do not occur with new oral appliance once created.    OSA on CPAP Contact your dentist as you would like to see if if they make oral appliances for sleep apnea  If not these of the 2 that we  traditionally refer to:  We will refer you to Dr. Augustina Mood for your oral appliance  [address] [place]. [place], [place] [address] Phone: [phone] Website: [email or web address]  We will refer you to Dr. Ron Parker for your oral appliance  [facility] [address]. [place], [place] [address] Phone: [phone]       Continue CPAP use in the meantime Follow-up with [place] in 6 months with oral appliance at home sleep study  . Keep up the hard work using your device.  . Do not drive or operate heavy machinery if tired or drowsy.  . Please notify the supply company and office if you are unable to use your device regularly due to missing supplies or machine being broken.  . Work on maintaining a healthy weight and following your recommended nutrition plan  . Maintain proper daily exercise and movement  . Maintaining proper use of your device can also help improve management of other chronic illnesses such as: Blood pressure, blood sugars, and weight management.       This appointment was 29 minutes along with her 50% of that time in direct face-to-face, plan of care, assessment.    Lauraine Rinne, NP 07/24/2017

## 2017-07-24 NOTE — Telephone Encounter (Signed)
Called and spoke with patient regarding results.  Informed the patient of results and recommendations today. Scheduled appt with B.Mack today for 10:45am Pt verbalized understanding and denied any questions or concerns at this time.  Nothing further needed.

## 2017-07-24 NOTE — Assessment & Plan Note (Signed)
Contact your dentist as you would like to see if if they make oral appliances for sleep apnea  If not these of the 2 that we traditionally refer to:  We will refer you to Dr. Augustina Mood for your oral appliance  [address] [place]. [place], [place] [address] Phone: [phone] Website: [email or web address]  We will refer you to Dr. Ron Parker for your oral appliance  [facility] [address]. [place], [place] [address] Phone: [phone]       Continue CPAP use in the meantime Follow-up with [place] in 6 months with oral appliance at home sleep study  . Keep up the hard work using your device.  . Do not drive or operate heavy machinery if tired or drowsy.  . Please notify the supply company and office if you are unable to use your device regularly due to missing supplies or machine being broken.  . Work on maintaining a healthy weight and following your recommended nutrition plan  . Maintain proper daily exercise and movement  . Maintaining proper use of your device can also help improve management of other chronic illnesses such as: Blood pressure, blood sugars, and weight management.

## 2017-07-26 ENCOUNTER — Other Ambulatory Visit: Payer: Self-pay | Admitting: Family Medicine

## 2017-07-27 ENCOUNTER — Telehealth: Payer: Self-pay | Admitting: Pulmonary Disease

## 2017-07-27 DIAGNOSIS — G4733 Obstructive sleep apnea (adult) (pediatric): Secondary | ICD-10-CM

## 2017-07-27 DIAGNOSIS — Z9989 Dependence on other enabling machines and devices: Principal | ICD-10-CM

## 2017-07-27 NOTE — Telephone Encounter (Signed)
Called pt and spoke with pt's spouse [name] who stated pt wants to go through with the referral to Dr. Ron Parker.  Order has been placed for pt. Nothing further needed.

## 2017-07-29 DIAGNOSIS — L219 Seborrheic dermatitis, unspecified: Secondary | ICD-10-CM | POA: Diagnosis not present

## 2017-08-19 ENCOUNTER — Other Ambulatory Visit: Payer: Self-pay

## 2017-09-10 ENCOUNTER — Ambulatory Visit: Payer: Self-pay | Admitting: *Deleted

## 2017-09-10 ENCOUNTER — Encounter (HOSPITAL_COMMUNITY): Payer: Self-pay | Admitting: Emergency Medicine

## 2017-09-10 ENCOUNTER — Emergency Department (HOSPITAL_BASED_OUTPATIENT_CLINIC_OR_DEPARTMENT_OTHER): Payer: Medicare Other

## 2017-09-10 ENCOUNTER — Emergency Department (HOSPITAL_COMMUNITY)
Admission: EM | Admit: 2017-09-10 | Discharge: 2017-09-10 | Disposition: A | Discharge status: Home / Self Care | Payer: Medicare Other | Attending: Emergency Medicine | Admitting: Emergency Medicine

## 2017-09-10 ENCOUNTER — Other Ambulatory Visit: Payer: Self-pay

## 2017-09-10 ENCOUNTER — Emergency Department (HOSPITAL_COMMUNITY): Payer: Medicare Other

## 2017-09-10 DIAGNOSIS — M7989 Other specified soft tissue disorders: Secondary | ICD-10-CM

## 2017-09-10 DIAGNOSIS — I82432 Acute embolism and thrombosis of left popliteal vein: Secondary | ICD-10-CM

## 2017-09-10 DIAGNOSIS — R2242 Localized swelling, mass and lump, left lower limb: Secondary | ICD-10-CM | POA: Diagnosis not present

## 2017-09-10 DIAGNOSIS — Z96651 Presence of right artificial knee joint: Secondary | ICD-10-CM | POA: Insufficient documentation

## 2017-09-10 DIAGNOSIS — M79609 Pain in unspecified limb: Secondary | ICD-10-CM

## 2017-09-10 DIAGNOSIS — Z79899 Other long term (current) drug therapy: Secondary | ICD-10-CM | POA: Diagnosis not present

## 2017-09-10 DIAGNOSIS — M79662 Pain in left lower leg: Secondary | ICD-10-CM | POA: Diagnosis present

## 2017-09-10 DIAGNOSIS — Z7982 Long term (current) use of aspirin: Secondary | ICD-10-CM | POA: Insufficient documentation

## 2017-09-10 DIAGNOSIS — Z96652 Presence of left artificial knee joint: Secondary | ICD-10-CM | POA: Diagnosis not present

## 2017-09-10 DIAGNOSIS — I10 Essential (primary) hypertension: Secondary | ICD-10-CM | POA: Diagnosis not present

## 2017-09-10 DIAGNOSIS — E039 Hypothyroidism, unspecified: Secondary | ICD-10-CM | POA: Diagnosis not present

## 2017-09-10 LAB — CBC WITH DIFFERENTIAL/PLATELET
ABS IMMATURE GRANULOCYTES: 0 10*3/uL (ref 0.0–0.1)
BASOS ABS: 0.1 10*3/uL (ref 0.0–0.1)
BASOS PCT: 1 %
Eosinophils Absolute: 0.2 10*3/uL (ref 0.0–0.7)
Eosinophils Relative: 4 %
HCT: 42.2 % (ref 39.0–52.0)
Hemoglobin: 13.6 g/dL (ref 13.0–17.0)
Immature Granulocytes: 0 %
Lymphocytes Relative: 42 %
Lymphs Abs: 2.7 10*3/uL (ref 0.7–4.0)
MCH: 31.1 pg (ref 26.0–34.0)
MCHC: 32.2 g/dL (ref 30.0–36.0)
MCV: 96.6 fL (ref 78.0–100.0)
MONO ABS: 0.8 10*3/uL (ref 0.1–1.0)
Monocytes Relative: 13 %
NEUTROS ABS: 2.5 10*3/uL (ref 1.7–7.7)
Neutrophils Relative %: 40 %
PLATELETS: 226 10*3/uL (ref 150–400)
RBC: 4.37 MIL/uL (ref 4.22–5.81)
RDW: 12.9 % (ref 11.5–15.5)
WBC: 6.3 10*3/uL (ref 4.0–10.5)

## 2017-09-10 LAB — COMPREHENSIVE METABOLIC PANEL
ALT: 15 U/L (ref 0–44)
ANION GAP: 7 (ref 5–15)
AST: 20 U/L (ref 15–41)
Albumin: 3.8 g/dL (ref 3.5–5.0)
Alkaline Phosphatase: 49 U/L (ref 38–126)
BUN: 16 mg/dL (ref 8–23)
CHLORIDE: 107 mmol/L (ref 98–111)
CO2: 25 mmol/L (ref 22–32)
Calcium: 8.8 mg/dL — ABNORMAL LOW (ref 8.9–10.3)
Creatinine, Ser: 1.22 mg/dL (ref 0.61–1.24)
GFR calc non Af Amer: 53 mL/min — ABNORMAL LOW (ref >60)
Glucose, Bld: 91 mg/dL (ref 70–99)
Potassium: 4.6 mmol/L (ref 3.5–5.1)
SODIUM: 139 mmol/L (ref 135–145)
Total Bilirubin: 0.9 mg/dL (ref 0.3–1.2)
Total Protein: 7.5 g/dL (ref 6.5–8.1)

## 2017-09-10 MED ORDER — ELIQUIS 5 MG VTE STARTER PACK: ORAL_TABLET | ORAL | 0 refills | Status: DC

## 2017-09-10 MED ORDER — APIXABAN 5 MG PO TABS
10.0000 mg | ORAL_TABLET | Freq: Two times a day (BID) | ORAL | Status: DC
  Administered 2017-09-10: 10 mg via ORAL
  Filled 2017-09-10: qty 2

## 2017-09-10 MED ORDER — APIXABAN 5 MG PO TABS: 5.0000 mg | ORAL_TABLET | Freq: Two times a day (BID) | ORAL | Status: DC
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
## 2017-09-10 NOTE — ED Triage Notes (Signed)
Pt reports hx of DVTs in left leg and reports having similar sx. Pt reports sore spot in lower left leg, warm and painful to touch to left leg that have been going on for the last three weeks and have gotten progressively worse today. Denies abnormal swelling. Takes 1m asa daily. Denies recent long travels or any innjuries.

## 2017-09-10 NOTE — ED Provider Notes (Signed)
Patient placed in [facility] Look pathway, seen and evaluated   Chief Complaint: L leg pain/swelling  HPI:   States he has a history of bilateral ankle swelling that improves overnight.  In the past 3 weeks, he has been having worsening left lower leg pain with mild increase of swelling.  He states it feels very sensitive.  It is worse with palpation and walking.  Nothing makes the pain better.  This is exactly how his previous to DVTs have fell.  They have both been in the left leg.  He denies recent travel, surgery, immobilization.  He is not currently on blood thinners.  He takes aspirin daily.  He denies symptoms on the right leg.  He denies fevers, chills, chest pain, shortness of breath.  ROS: L lower leg pain  Physical Exam:   Gen: No distress  Neuro: Awake and Alert  Skin: Warm.  Tenderness of the left lower medial leg with mild erythema.  No obvious injury.  Pedal pulses intact.  Sensation intact.  Good cap refill.     Initiation of care has begun. The patient has been counseled on the process, plan, and necessity for staying for the completion/evaluation, and the remainder of the medical screening examination    Franchot Heidelberg, PA-C 09/10/17 1923    Mesner, Corene Cornea, MD 09/11/17 [phone]

## 2017-09-10 NOTE — Telephone Encounter (Signed)
Noted. Agree with ER dispo.

## 2017-09-10 NOTE — Progress Notes (Signed)
ANTICOAGULATION CONSULT NOTE - Initial Consult  Pharmacy Consult for apixaban Indication: DVT  Allergies  Allergen Reactions  . Cyclobenzaprine     Rash, chills, shaking  . Oxycodone-Acetaminophen     REACTION: unspecified  . Propoxyphene N-Acetaminophen     REACTION: itching---hot and cold flashes    Patient Measurements: Height: 6' (182.9 cm) Weight: 216 lb (98 kg) IBW/kg (Calculated) : 77.6  Vital Signs: Temp: 98.7 F (37.1 C) (08/22 1825) Temp Source: Oral (08/22 1825) BP: 138/80 (08/22 2045) Pulse Rate: 69 (08/22 2045)  Labs: Recent Labs    09/10/17 1857  HGB 13.6  HCT 42.2  PLT 226  CREATININE 1.22    Estimated Creatinine Clearance: 54.7 mL/min (by C-G formula based on SCr of 1.22 mg/dL).  Assessment: 84 yo m presenting with left leg pain and swelling - previous DVT in 08/2016 and underwent 3 months of apixaban therapy. No longer on anticoagulant  LLE dopler shows acute DVT of popliteal vein  SCr 1.22 CBC WNL  Plan:  apixaban 10 mg bid x 1 week apixaban 5 mg bid thereafter Monitor for bleeding  Levester Fresh, PharmD, BCPS, BCCCP Clinical Pharmacist [phone]  Please check AMION for all [facility] numbers  09/10/2017 9:38 PM

## 2017-09-10 NOTE — Discharge Instructions (Signed)
You were seen today for a DVT.  You will be started on Eliquis.  The remainder of your prescription is at the pharmacy specified.  Please refrain from any road biking while you are treated, as accident could result in serious bleeding.  Please return to the emergency department if you develop any increasing pain or swelling of the left lower extremity, redness, or any chest pain or shortness of breath.  Information on my medicine - ELIQUIS (apixaban)  This medication education was reviewed with me or my healthcare representative as part of my discharge preparation.  The pharmacist that spoke with me during my hospital stay was:  Lavenia Atlas, [place]? Eliquis was prescribed to treat blood clots that may have been found in the veins of your legs (deep vein thrombosis) or in your lungs (pulmonary embolism) and to reduce the risk of them occurring again.  WHAT DO YOU NEED TO KNOW ABOUT ELIQUIS ? The starting dose is 10 mg (two 5 mg tablets) taken TWICE daily for the FIRST SEVEN (7) DAYS, then on (enter date)  09/18/17  the dose is reduced to ONE 5 mg tablet taken TWICE daily.  Eliquis may be taken with or without food.   Try to take the dose about the same time in the morning and in the evening. If you have difficulty swallowing the tablet whole please discuss with your pharmacist how to take the medication safely.  Take Eliquis exactly as prescribed and DO NOT stop taking Eliquis without talking to the doctor who prescribed the medication.  Stopping may increase your risk of developing a new blood clot.  Refill your prescription before you run out.  After discharge, you should have regular check-up appointments with your healthcare provider that is prescribing your Eliquis.    WHAT DO YOU DO IF YOU MISS A DOSE? If a dose of ELIQUIS is not taken at the scheduled time, take it as soon as possible on the same day and twice-daily administration  should be resumed. The dose should not be doubled to make up for a missed dose.  IMPORTANT SAFETY INFORMATION A possible side effect of Eliquis is bleeding. You should call your healthcare provider right away if you experience any of the following: Bleeding from an injury or your nose that does not stop. Unusual colored urine (red or dark brown) or unusual colored stools (red or black). Unusual bruising for unknown reasons. A serious fall or if you hit your head (even if there is no bleeding).  Some medicines may interact with Eliquis and might increase your risk of bleeding or clotting while on Eliquis. To help avoid this, consult your healthcare provider or pharmacist prior to using any new prescription or non-prescription medications, including herbals, vitamins, non-steroidal anti-inflammatory drugs (NSAIDs) and supplements.  This website has more information on Eliquis (apixaban): http://www.eliquis.com/eliquis/home

## 2017-09-10 NOTE — Telephone Encounter (Signed)
Pt called with complaints of left leg pain that started 3 weeks ago; he says the pain started at his ankle, and moved up to about 6 inches his left ankle; the area appears slightly more swollen than usual, he says that he does wear compression hose;  the pt also says that the area is warm and painful to the touch;  he states that he has taken naproxen for pain but it has not helped; the pt also says that he feels "similar to how he felt when he had a previous blood clot in his leg"; additionally the pt also says that he has pain on his right side of chest intermittently when he is sitting or slumped over; recommendations made per nurse triage protocol to include seeing a physician within 4 hours; the pt verbalizes understanding and will go to the ED; the pt normally sees Dr Danise Mina, [place]; will route to office for notification of this encounter.   Reason for Disposition . [1] SEVERE pain (e.g., excruciating, unable to do any normal activities) AND [2] not improved after 2 hours of pain medicine . History of prior "blood clot" in leg or lungs (i.e., deep vein thrombosis, pulmonary embolism)  Answer Assessment - Initial Assessment Questions 1. ONSET: "When did the pain start?"      Around 08/20/17 2. LOCATION: "Where is the pain located?"      Left ankle and 6 inches up 3. PAIN: "How bad is the pain?"    (Scale 1-10; or mild, moderate, severe)   -  MILD (1-3): doesn't interfere with normal activities    -  MODERATE (4-7): interferes with normal activities (e.g., work or school) or awakens from sleep, limping    -  SEVERE (8-10): excruciating pain, unable to do any normal activities, unable to walk     severe 4. WORK OR EXERCISE: "Has there been any recent work or exercise that involved this part of the body?"      no 5. CAUSE: "What do you think is causing the leg pain?"     Not sure 6. OTHER SYMPTOMS: "Do you have any other symptoms?" (e.g., chest pain, back pain, breathing difficulty,  swelling, rash, fever, numbness, weakness)    Area is tender to the touch and warm  7. PREGNANCY: "Is there any chance you are pregnant?" "When was your last menstrual period?"     n/a  Protocols used: LEG PAIN-A-AH

## 2017-09-10 NOTE — Telephone Encounter (Signed)
I spoke with pts wife and pt is at work and will leave work and come home to pick up his wife and then will go to [facility] ED( Mrs Branham said it could be close to 5PM before pt gets to ED). FYI to Dr Darnell Level who is out of office this afternoon and to Dr Damita Dunnings who is in office this afternoon.

## 2017-09-10 NOTE — Progress Notes (Addendum)
Left lower extremity venous duplex completed. Preliminary results - Positive for an acute DVT of the entire popliteal vein. [facility], [place] 09/10/2017 7;58 PM

## 2017-09-11 NOTE — Telephone Encounter (Signed)
Noted. Thanks.

## 2017-09-11 NOTE — Telephone Encounter (Signed)
Will you please schedule ER f/u?

## 2017-09-11 NOTE — Telephone Encounter (Signed)
Seen at ER, recurrent DVT.  plz call and schedule ER f/u visit in 1-2 wks, 30 min appt.  Thank you.

## 2017-09-14 NOTE — Telephone Encounter (Signed)
Pt has appointment schedule for 8/29.  Is this ok?

## 2017-09-14 NOTE — Telephone Encounter (Signed)
That works, thanks.

## 2017-09-15 ENCOUNTER — Ambulatory Visit: Payer: Medicare Other | Admitting: Family Medicine

## 2017-09-15 ENCOUNTER — Encounter

## 2017-09-17 ENCOUNTER — Encounter: Payer: Self-pay | Admitting: Family Medicine

## 2017-09-17 ENCOUNTER — Ambulatory Visit (INDEPENDENT_AMBULATORY_CARE_PROVIDER_SITE_OTHER): Payer: Medicare Other | Admitting: Family Medicine

## 2017-09-17 VITALS — BP 142/76 | HR 80 | Temp 98.3°F | Ht 71.75 in | Wt 213.5 lb

## 2017-09-17 DIAGNOSIS — D6859 Other primary thrombophilia: Secondary | ICD-10-CM | POA: Diagnosis not present

## 2017-09-17 DIAGNOSIS — K5790 Diverticulosis of intestine, part unspecified, without perforation or abscess without bleeding: Secondary | ICD-10-CM | POA: Insufficient documentation

## 2017-09-17 DIAGNOSIS — I82432 Acute embolism and thrombosis of left popliteal vein: Secondary | ICD-10-CM | POA: Diagnosis not present

## 2017-09-17 DIAGNOSIS — K862 Cyst of pancreas: Secondary | ICD-10-CM | POA: Insufficient documentation

## 2017-09-17 DIAGNOSIS — Z8672 Personal history of thrombophlebitis: Secondary | ICD-10-CM | POA: Diagnosis not present

## 2017-09-17 DIAGNOSIS — R918 Other nonspecific abnormal finding of lung field: Secondary | ICD-10-CM

## 2017-09-17 MED ORDER — APIXABAN 5 MG PO TABS: 5.0000 mg | ORAL_TABLET | Freq: Two times a day (BID) | ORAL | 11 refills | Status: DC

## 2017-09-17 NOTE — Assessment & Plan Note (Signed)
Has had 3 unprovoked DVTs. Will need lifelong anticoagulation. Currently on eliquis starter pack.

## 2017-09-17 NOTE — Assessment & Plan Note (Signed)
H/o this on CT from 2006, all less than 5.72m. I don't see where f/u imaging was performed. Discussed with patient. Will update lung CT.
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
## 2017-09-17 NOTE — Patient Instructions (Addendum)
Stop aspirin.  Recommend lifelong anticoagulation with eliquis 63m twice daily.  Try tylenol first instead of naprosyn for back pain. Prior imaging study showed lung nodules. We will check CT scan of lungs to recheck these. See our referral coordinators to schedule lung imaging.

## 2017-09-17 NOTE — Assessment & Plan Note (Signed)
Recurrent, unprovoked.  Discussed lifelong anticoagulation - have refilled eliquis. Discussed increased bleeding risk on this medication.  Offered heme referral - pt declines at this time, concerned about taking time off work at this time. Will monitor on eliquis, if any concerns he will let us know and consider heme referral.

## 2017-09-19 ENCOUNTER — Ambulatory Visit (HOSPITAL_BASED_OUTPATIENT_CLINIC_OR_DEPARTMENT_OTHER)
Admission: RE | Admit: 2017-09-19 | Discharge: 2017-09-19 | Disposition: A | Discharge status: Home / Self Care | Payer: Medicare Other | Source: Ambulatory Visit | Attending: Family Medicine | Admitting: Family Medicine

## 2017-09-19 DIAGNOSIS — J479 Bronchiectasis, uncomplicated: Secondary | ICD-10-CM | POA: Insufficient documentation

## 2017-09-19 DIAGNOSIS — J8410 Pulmonary fibrosis, unspecified: Secondary | ICD-10-CM | POA: Diagnosis not present

## 2017-09-19 DIAGNOSIS — I517 Cardiomegaly: Secondary | ICD-10-CM | POA: Diagnosis not present

## 2017-09-19 DIAGNOSIS — E041 Nontoxic single thyroid nodule: Secondary | ICD-10-CM | POA: Insufficient documentation

## 2017-09-19 DIAGNOSIS — R918 Other nonspecific abnormal finding of lung field: Secondary | ICD-10-CM | POA: Diagnosis not present

## 2017-09-19 DIAGNOSIS — I2510 Atherosclerotic heart disease of native coronary artery without angina pectoris: Secondary | ICD-10-CM | POA: Diagnosis not present

## 2017-09-23 ENCOUNTER — Telehealth: Payer: Self-pay

## 2017-09-23 ENCOUNTER — Encounter: Payer: Self-pay | Admitting: Family Medicine

## 2017-09-23 ENCOUNTER — Ambulatory Visit: Payer: Medicare Other | Admitting: Family Medicine

## 2017-09-23 DIAGNOSIS — I3139 Other pericardial effusion (noninflammatory): Secondary | ICD-10-CM | POA: Insufficient documentation

## 2017-09-23 DIAGNOSIS — R918 Other nonspecific abnormal finding of lung field: Secondary | ICD-10-CM

## 2017-09-23 DIAGNOSIS — J479 Bronchiectasis, uncomplicated: Secondary | ICD-10-CM | POA: Insufficient documentation

## 2017-09-23 DIAGNOSIS — I313 Pericardial effusion (noninflammatory): Secondary | ICD-10-CM | POA: Insufficient documentation

## 2017-09-23 DIAGNOSIS — J849 Interstitial pulmonary disease, unspecified: Secondary | ICD-10-CM | POA: Insufficient documentation

## 2017-09-23 DIAGNOSIS — I2510 Atherosclerotic heart disease of native coronary artery without angina pectoris: Secondary | ICD-10-CM | POA: Insufficient documentation

## 2017-09-23 DIAGNOSIS — E041 Nontoxic single thyroid nodule: Secondary | ICD-10-CM | POA: Insufficient documentation

## 2017-09-23 NOTE — Telephone Encounter (Signed)
Copied from [place]. Topic: General - Other >> Sep 22, 2017  8:39 AM [name] wrote: Reason for CRM: Patient received notification via mychart that he needs an appt after he had his ct scan. Patient's wife unsure of why he needs an appt, please advise.  Dr. Darnell Level, did you send pt a MyChart message?

## 2017-09-23 NOTE — Telephone Encounter (Signed)
Does not need appt.  Called and spoke with patient regarding CT results. See result note.

## 2017-10-02 ENCOUNTER — Ambulatory Visit (INDEPENDENT_AMBULATORY_CARE_PROVIDER_SITE_OTHER): Payer: Medicare Other | Admitting: Family Medicine

## 2017-10-02 ENCOUNTER — Ambulatory Visit: Payer: Self-pay

## 2017-10-02 ENCOUNTER — Encounter: Payer: Self-pay | Admitting: Family Medicine

## 2017-10-02 VITALS — BP 144/70 | HR 96 | Temp 97.9°F | Ht 71.75 in | Wt 216.5 lb

## 2017-10-02 DIAGNOSIS — I313 Pericardial effusion (noninflammatory): Secondary | ICD-10-CM | POA: Diagnosis not present

## 2017-10-02 DIAGNOSIS — J849 Interstitial pulmonary disease, unspecified: Secondary | ICD-10-CM | POA: Diagnosis not present

## 2017-10-02 DIAGNOSIS — R0602 Shortness of breath: Secondary | ICD-10-CM | POA: Diagnosis not present

## 2017-10-02 DIAGNOSIS — I3139 Other pericardial effusion (noninflammatory): Secondary | ICD-10-CM

## 2017-10-02 DIAGNOSIS — I82432 Acute embolism and thrombosis of left popliteal vein: Secondary | ICD-10-CM | POA: Diagnosis not present

## 2017-10-02 NOTE — Telephone Encounter (Signed)
Pt. Reports developed right groin "fluttering feeling and a dry cough" 2 days ago. Occasional shortness of breath. No chest pain. No swelling or warmth at right groin. Is currently on Eliquis for DVT. States he is concerned because of his DVT diagnosis. Appointment made per [facility].  Reason for Disposition . [1] Thigh or calf pain AND [2] only 1 side AND [3] present > 1 hour  Answer Assessment - Initial Assessment Questions 1. ONSET: "When did the pain start?"      2 days ago 2. LOCATION: "Where is the pain located?"      Right groin 3. PAIN: "How bad is the pain?"    (Scale 1-10; or mild, moderate, severe)   -  MILD (1-3): doesn't interfere with normal activities    -  MODERATE (4-7): interferes with normal activities (e.g., work or school) or awakens from sleep, limping    -  SEVERE (8-10): excruciating pain, unable to do any normal activities, unable to walk     0 4. WORK OR EXERCISE: "Has there been any recent work or exercise that involved this part of the body?"      No 5. CAUSE: "What do you think is causing the leg pain?"     Unsure 6. OTHER SYMPTOMS: "Do you have any other symptoms?" (e.g., chest pain, back pain, breathing difficulty, swelling, rash, fever, numbness, weakness)     Dry cough 2 days ago. Some shortness of breath at times 7. PREGNANCY: "Is there any chance you are pregnant?" "When was your last menstrual period?"     n/a  Protocols used: LEG PAIN-A-AH

## 2017-10-02 NOTE — Assessment & Plan Note (Signed)
This could be cause of progressive dyspnea. Will first evaluate heart.

## 2017-10-02 NOTE — Patient Instructions (Signed)
I will order CT angio of lungs - see our referral coordinators. We will be in touch with results and may refer you to heart doctor for further evaluation of shortness of breath.

## 2017-10-02 NOTE — Assessment & Plan Note (Addendum)
Tolerating eliquis - will need this lifelong.

## 2017-10-02 NOTE — Assessment & Plan Note (Signed)
Recently found. Will await CTA results.

## 2017-10-02 NOTE — Assessment & Plan Note (Signed)
Endorses h/o mild dyspnea with exertion, but over the last few days more noticeable dyspnea even at rest. This has also been associated with progressive dry cough.  Recently found pericardial effusion, bronchiectasis, and interstitial lung disease by CT last month (done to f/u pulm nodules).  No prior cardiac evaluation.  In known active DVT currently on eliquis, I do think we need to r/o PE as cause of worsening dyspnea. Check CTA - if normal, will further eval symptoms with cardiology evaluation. Pt and wife agree with plan.

## 2017-10-02 NOTE — Progress Notes (Signed)
BP (!) 144/70 (BP Location: Left Arm, Cuff Size: Normal)   Pulse 96   Temp 97.9 F (36.6 C) (Oral)   Ht 5' 11.75" (1.822 m)   Wt 216 lb 8 oz (98.2 kg)   SpO2 96%   BMI 29.57 kg/m    CC: cough, R groin sensation Subjective:    Patient ID: Justin Hester, male    DOB: 02/12/1930, 84 y.o.   MRN: [id number]  HPI: Justin Hester is a 84 y.o. male presenting on 10/02/2017 for dry cough (started 2-3 days ago) and sensation in right groin (started about 2-3 days ago)   Recent recurrent acute LLE DVT in popliteal vein started on eliquis. Will need lifelong anticoagulation.   Dry cough started 2 days ago. No production of mucous. Denies wheezing, chest pain. Mild dyspnea at rest (previously more noticeable with exertion).  R groin bubbling movement sensation seems related to his breathing.  No sick contacts at home.  Legs feel well.  OSA off CPAP for the past month - because he was doing well - now using Tap3 dental prosthetic he wears at night to keep airways open.   Recent non contrasted CT chest 08/2017 we previously reviewed: Pulm nodules improved. Some bronchiectasis, interstitial lung disease, CAD, pericardial effusion, benign thyroid nodule - rec monitoring for symptoms and if feeling well don't recommend further evaluation at this time.  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   Relevant past medical, surgical, family and social history reviewed and updated as indicated. Interim medical history since our last visit reviewed. Allergies and medications reviewed and updated. Outpatient Medications Prior to Visit  Medication Sig Dispense Refill  . amLODipine (NORVASC) 10 MG tablet TAKE 1 TABLET BY MOUTH EVERY DAY 60 tablet 2  . apixaban (ELIQUIS) 5 MG TABS tablet Take 1 tablet (5 mg total) by mouth 2 (two) times daily. 60 tablet 11  . Desoximetasone 9.44 % GEL 1 application daily.    . furosemide (LASIX) 40 MG tablet TAKE 1 TABLET BY MOUTH EVERY DAY 60 tablet 2  .  levothyroxine (SYNTHROID, LEVOTHROID) 50 MCG tablet TAKE 1 TABLET BY MOUTH EVERY DAY; EXCEPT 2 TABLETS DAILY ON TUESDAY AND FRIDAY 120 tablet 1  . losartan (COZAAR) 100 MG tablet TAKE 1 TABLET BY MOUTH EVERY DAY 60 tablet 2  . loteprednol (LOTEMAX) 0.5 % ophthalmic suspension Place 1 drop into both eyes 2 times daily.    . polyethylene glycol (MIRALAX / GLYCOLAX) packet Take 17 g by mouth as needed.     . naproxen (NAPROSYN) 250 MG tablet Take 250 mg by mouth daily as needed.      No facility-administered medications prior to visit.      Per HPI unless specifically indicated in ROS section below Review of Systems     Objective:    BP (!) 144/70 (BP Location: Left Arm, Cuff Size: Normal)   Pulse 96   Temp 97.9 F (36.6 C) (Oral)   Ht 5' 11.75" (1.822 m)   Wt 216 lb 8 oz (98.2 kg)   SpO2 96%   BMI 29.57 kg/m   Wt Readings from Last 3 Encounters:  10/02/17 216 lb 8 oz (98.2 kg)  09/17/17 213 lb 8 oz (96.8 kg)  09/10/17 216 lb (98 kg)    Physical Exam  Constitutional: He appears well-developed and well-nourished. No distress.  HENT:  Mouth/Throat: Oropharynx is clear and moist. No oropharyngeal exudate.  Cardiovascular: Normal rate and regular rhythm.  Murmur (systolic  heard today) heard. Pulmonary/Chest: Effort normal. No respiratory distress. He has decreased breath sounds. He has no wheezes. He has no rhonchi. He has no rales.  Coarse crackles bibasilarly  Musculoskeletal: He exhibits no edema.  Compression stocking in place LLE  Skin: Skin is warm and dry. No erythema.  Psychiatric: He has a normal mood and affect.  Nursing note and vitals reviewed.  Results for orders placed or performed during the hospital encounter of 09/10/17  CBC with Differential  Result Value Ref Range   WBC 6.3 4.0 - 10.5 K/uL   RBC 4.37 4.22 - 5.81 MIL/uL   Hemoglobin 13.6 13.0 - 17.0 g/dL   HCT 42.2 39.0 - 52.0 %   MCV 96.6 78.0 - 100.0 fL   MCH 31.1 26.0 - 34.0 pg   MCHC 32.2 30.0 - 36.0  g/dL   RDW 12.9 11.5 - 15.5 %   Platelets 226 150 - 400 K/uL   Neutrophils Relative % 40 %   Neutro Abs 2.5 1.7 - 7.7 K/uL   Lymphocytes Relative 42 %   Lymphs Abs 2.7 0.7 - 4.0 K/uL   Monocytes Relative 13 %   Monocytes Absolute 0.8 0.1 - 1.0 K/uL   Eosinophils Relative 4 %   Eosinophils Absolute 0.2 0.0 - 0.7 K/uL   Basophils Relative 1 %   Basophils Absolute 0.1 0.0 - 0.1 K/uL   Immature Granulocytes 0 %   Abs Immature Granulocytes 0.0 0.0 - 0.1 K/uL  Comprehensive metabolic panel  Result Value Ref Range   Sodium 139 135 - 145 mmol/L   Potassium 4.6 3.5 - 5.1 mmol/L   Chloride 107 98 - 111 mmol/L   CO2 25 22 - 32 mmol/L   Glucose, Bld 91 70 - 99 mg/dL   BUN 16 8 - 23 mg/dL   Creatinine, Ser 1.22 0.61 - 1.24 mg/dL   Calcium 8.8 (L) 8.9 - 10.3 mg/dL   Total Protein 7.5 6.5 - 8.1 g/dL   Albumin 3.8 3.5 - 5.0 g/dL   AST 20 15 - 41 U/L   ALT 15 0 - 44 U/L   Alkaline Phosphatase 49 38 - 126 U/L   Total Bilirubin 0.9 0.3 - 1.2 mg/dL   GFR calc non Af Amer 53 (L) >60 mL/min   GFR calc Af Amer >60 >60 mL/min   Anion gap 7 5 - 15      Assessment & Plan:   Problem List Items Addressed This Visit    Shortness of breath - Primary    Endorses h/o mild dyspnea with exertion, but over the last few days more noticeable dyspnea even at rest. This has also been associated with progressive dry cough.  Recently found pericardial effusion, bronchiectasis, and interstitial lung disease by CT last month (done to f/u pulm nodules).  No prior cardiac evaluation.  In known active DVT currently on eliquis, I do think we need to r/o PE as cause of worsening dyspnea. Check CTA - if normal, will further eval symptoms with cardiology evaluation. Pt and wife agree with plan.       Relevant Orders   CT Angio Chest W/Cm &/Or Wo Cm   Pericardial effusion    Recently found. Will await CTA results.       Interstitial lung disease ([place])    This could be cause of progressive dyspnea. Will first  evaluate heart.      Acute deep vein thrombosis (DVT) of popliteal vein of left lower extremity (HCC)  Tolerating eliquis - will need this lifelong.       Relevant Orders   CT Angio Chest W/Cm &/Or Wo Cm       No orders of the defined types were placed in this encounter.  Orders Placed This Encounter  Procedures  . CT Angio Chest W/Cm &/Or Wo Cm    Standing Status:   Future    Standing Expiration Date:   01/02/2019    Scheduling Instructions:     Pt requests tomorrow morning    Order Specific Question:   ** REASON FOR EXAM (FREE TEXT)    Answer:   worsening dyspnea and cough in known DVT    Order Specific Question:   If indicated for the ordered procedure, I authorize the administration of contrast media per Radiology protocol    Answer:   Yes    Order Specific Question:   Preferred imaging location?    Answer:   [facility] Specific Question:   Call Results- Best Contact Number?    Answer:   [phone]    Order Specific Question:   Radiology Contrast Protocol - do NOT remove file path    Answer:   \\[email or web address]    Follow up plan: No follow-ups on file.  Ria Bush, MD

## 2017-10-02 NOTE — Telephone Encounter (Signed)
Dr Darnell Level said can see pt 10/02/17 at 12:45. Magda Paganini said pt could come at that time.

## 2017-10-03 ENCOUNTER — Other Ambulatory Visit: Payer: Self-pay | Admitting: Family Medicine

## 2017-10-03 ENCOUNTER — Encounter (HOSPITAL_BASED_OUTPATIENT_CLINIC_OR_DEPARTMENT_OTHER): Payer: Self-pay

## 2017-10-03 ENCOUNTER — Ambulatory Visit (HOSPITAL_BASED_OUTPATIENT_CLINIC_OR_DEPARTMENT_OTHER)
Admission: RE | Admit: 2017-10-03 | Discharge: 2017-10-03 | Disposition: A | Discharge status: Home / Self Care | Payer: Medicare Other | Source: Ambulatory Visit | Attending: Family Medicine | Admitting: Family Medicine

## 2017-10-03 DIAGNOSIS — R0602 Shortness of breath: Secondary | ICD-10-CM | POA: Insufficient documentation

## 2017-10-03 DIAGNOSIS — R918 Other nonspecific abnormal finding of lung field: Secondary | ICD-10-CM | POA: Diagnosis not present

## 2017-10-03 DIAGNOSIS — I2510 Atherosclerotic heart disease of native coronary artery without angina pectoris: Secondary | ICD-10-CM | POA: Diagnosis not present

## 2017-10-03 DIAGNOSIS — I700 Atherosclerosis of aorta: Secondary | ICD-10-CM | POA: Insufficient documentation

## 2017-10-03 DIAGNOSIS — I82432 Acute embolism and thrombosis of left popliteal vein: Secondary | ICD-10-CM

## 2017-10-03 DIAGNOSIS — J849 Interstitial pulmonary disease, unspecified: Secondary | ICD-10-CM

## 2017-10-03 MED ORDER — IOPAMIDOL (ISOVUE-370) INJECTION 76%
100.0000 mL | Freq: Once | INTRAVENOUS | Status: AC | PRN
  Administered 2017-10-03: 87 mL via INTRAVENOUS

## 2017-10-22 ENCOUNTER — Ambulatory Visit: Payer: Self-pay | Admitting: *Deleted

## 2017-10-22 NOTE — Telephone Encounter (Signed)
Patient on eliquis. Received directions from dentist to take extra strength ibuprofen to help relieve inflammation caused by a dental mouth piece. Discussed with patient's wife the inflammatory process and what he could take for discomfort-tylenol.    Reason for Disposition . Caller has medication question only, adult not sick, and triager answers question  Answer Assessment - Initial Assessment Questions 1. SYMPTOMS: "Do you have any symptoms?"     no 2. SEVERITY: If symptoms are present, ask "Are they mild, moderate or severe?"    na  Protocols used: MEDICATION QUESTION CALL-A-AH

## 2017-10-27 ENCOUNTER — Other Ambulatory Visit: Payer: Self-pay | Admitting: Family Medicine

## 2017-10-30 ENCOUNTER — Institutional Professional Consult (permissible substitution): Payer: Medicare Other | Admitting: Pulmonary Disease

## 2017-11-10 ENCOUNTER — Institutional Professional Consult (permissible substitution): Payer: Medicare Other | Admitting: Pulmonary Disease

## 2017-11-18 ENCOUNTER — Other Ambulatory Visit (INDEPENDENT_AMBULATORY_CARE_PROVIDER_SITE_OTHER): Payer: Medicare Other

## 2017-11-18 ENCOUNTER — Ambulatory Visit (INDEPENDENT_AMBULATORY_CARE_PROVIDER_SITE_OTHER): Payer: Medicare Other | Admitting: Pulmonary Disease

## 2017-11-18 ENCOUNTER — Encounter: Payer: Self-pay | Admitting: Pulmonary Disease

## 2017-11-18 VITALS — BP 138/82 | HR 67 | Ht 71.75 in | Wt 217.8 lb

## 2017-11-18 DIAGNOSIS — J849 Interstitial pulmonary disease, unspecified: Secondary | ICD-10-CM | POA: Diagnosis not present

## 2017-11-18 DIAGNOSIS — G4733 Obstructive sleep apnea (adult) (pediatric): Secondary | ICD-10-CM | POA: Diagnosis not present

## 2017-11-18 LAB — SEDIMENTATION RATE: SED RATE: 27 mm/h — AB (ref 0–20)

## 2017-11-18 NOTE — Progress Notes (Signed)
   Subjective:    Patient ID: Justin Hester, male    DOB: 11-15-1930, 84 y.o.   MRN: [id number]  HPI    Review of Systems  Constitutional: Negative for fever and unexpected weight change.  HENT: Negative for congestion, dental problem, ear pain, nosebleeds, postnasal drip, rhinorrhea, sinus pressure, sneezing, sore throat and trouble swallowing.   Eyes: Negative for redness and itching.  Respiratory: Positive for cough and shortness of breath. Negative for chest tightness and wheezing.   Cardiovascular: Negative for palpitations and leg swelling.  Gastrointestinal: Negative for nausea and vomiting.  Genitourinary: Negative for dysuria.  Musculoskeletal: Positive for joint swelling.  Skin: Negative for rash.  Allergic/Immunologic: Negative.  Negative for environmental allergies, food allergies and immunocompromised state.  Neurological: Negative for headaches.  Hematological: Does not bruise/bleed easily.  Psychiatric/Behavioral: Negative for dysphoric mood. The patient is not nervous/anxious.        Objective:   Physical Exam        Assessment & Plan:

## 2017-11-18 NOTE — Progress Notes (Signed)
[facility]  Chief Complaint  Patient presents with  . Pulm Consult    Referred by his PCP Dr. Danise Mina for SOB. Noticed this after his CT scan in Sept 2019.  Pt has already been seen by [facility] for OSA. Wanted to stay with [facility].     Constitutional:  BP 138/82 (BP Location: Left Arm, Patient Position: Sitting, Cuff Size: Normal)   Pulse 67   Ht 5' 11.75" (1.822 m)   Wt 217 lb 12.8 oz (98.8 kg)   SpO2 98%   BMI 29.75 kg/m   Past Medical History:  Anemia, Cataract, Constipation, Diverticulosis, DJD, DVT, GERD, HTN, Hypothyroidism, Back pain  Brief Summary:  Justin Hester is a 83 y.o. male with obstructive sleep apnea here for assessment of abnormal CT chest.  He was seen by Dr. Ron Parker and changed to oral appliance.  This is working well.  He had pain in his leg and found to have DVT.  This prompted chest imaging studies.  This showed changes of UIP.  He has occasional cough, mostly in the morning.  He does have reflux.  He tends to drinks soda a lot and eats lots of tomatoes.    He rides a stationary bike for 12 miles per day.  He sometimes gets winded doing this.  Otherwise he feels well.  Denies fever, skin rash, sinus congestion, change in vision, chest pain, joint swelling.  Physical Exam:   Appearance - well kempt   ENMT - clear nasal mucosa, midline nasal  septum, no oral exudates, no LAN, trachea midline  Respiratory - normal chest wall, normal respiratory effort, no accessory muscle use, no wheeze/rales  CV - s1s2 regular rate and rhythm, no murmurs, no peripheral edema, radial pulses symmetric  GI - soft, non tender, no masses  Lymph - no adenopathy noted in neck and axillary areas  MSK - normal gait  Ext - no cyanosis, clubbing, or joint inflammation noted  Skin - no rashes, lesions, or ulcers  Neuro - normal strength, oriented x 3  Psych - normal mood and affect  Discussion:  He has incidental finding of UIP  pattern on CT chest.  He is remarkably active for his age, and has very minimal symptoms.  His physical exam is also unrevealing.  Assessment/Plan:   ILD with UIP pattern. - check serology and PFT - discussed options of esbriet/ofev; he is not sure he would want to start any other medications  Obstructive sleep apnea. - doing well with oral appliance from Dr. Ron Parker  GERD. - discussed diet modification - if this persists, then he might need further GI assessment - discussed how chronic reflux can be associated with ILD   Patient Instructions  Lab tests today  Will schedule pulmonary function test  Follow up in 2 to 3 weeks with Dr. Halford Chessman or Nurse Practitioner    Chesley Mires, MD [place] Pager: [phone] 11/18/2017, 12:45 PM  Flow Sheet     Pulmonary tests:    Sleep tests:  PSG 06/17/08 >> AHI 50 CPAP11/20/17 to 01/08/16 >>used on 30 of 30 nights with average 7 hrs 35 min. Average AHI 2.3 with median CPAP 10 and 95 th percentile CPAP 13 cm H2O Auto CPAP 04/09/17 to 07/07/17 >> used on 90 of 90 nights with average 7 hrs 23 min.  Average AHI 1.9 with median CPAP 10 and 95 th percentile CPAP 13 cm H2O HST 07/21/17 >> AHI 13.2, SpO2 low 81%  Review of Systems:  Constitutional: Negative for fever and unexpected weight change.  HENT: Negative for congestion, dental problem, ear pain, nosebleeds, postnasal drip, rhinorrhea, sinus pressure, sneezing, sore throat and trouble swallowing.   Eyes: Negative for redness and itching.  Respiratory: Positive for cough and shortness of breath. Negative for chest tightness and wheezing.   Cardiovascular: Negative for palpitations and leg swelling.  Gastrointestinal: Negative for nausea and vomiting.  Genitourinary: Negative for dysuria.  Musculoskeletal: Positive for joint swelling.  Skin: Negative for rash.  Allergic/Immunologic: Negative.  Negative for environmental allergies, food allergies and  immunocompromised state.  Neurological: Negative for headaches.  Hematological: Does not bruise/bleed easily.  Psychiatric/Behavioral: Negative for dysphoric mood. The patient is not nervous/anxious.    Medications:   Allergies as of 11/18/2017      Reactions   Cyclobenzaprine    Rash, chills, shaking   Oxycodone-acetaminophen    REACTION: unspecified   Propoxyphene N-acetaminophen    REACTION: itching---hot and cold flashes      Medication List        Accurate as of 11/18/17 12:45 PM. Always use your most recent med list.          amLODipine 10 MG tablet Commonly known as:  NORVASC TAKE 1 TABLET BY MOUTH EVERY DAY   apixaban 5 MG Tabs tablet Commonly known as:  ELIQUIS Take 1 tablet (5 mg total) by mouth 2 (two) times daily.   Desoximetasone 0.90 % Gel 1 application daily.   furosemide 40 MG tablet Commonly known as:  LASIX TAKE 1 TABLET BY MOUTH EVERY DAY   levothyroxine 50 MCG tablet Commonly known as:  SYNTHROID, LEVOTHROID TAKE 1 TABLET BY MOUTH EVERY DAY; EXCEPT 2 TABLETS DAILY ON TUESDAY AND FRIDAY   losartan 100 MG tablet Commonly known as:  COZAAR TAKE 1 TABLET BY MOUTH EVERY DAY   loteprednol 0.5 % ophthalmic suspension Commonly known as:  LOTEMAX Place 1 drop into both eyes 2 times daily.   polyethylene glycol packet Commonly known as:  MIRALAX / GLYCOLAX Take 17 g by mouth as needed.       Past Surgical History:  He  has a past surgical history that includes Cataract extraction, bilateral; Total knee arthroplasty (left 9/06, right 10/07); Colonoscopy (2012); and Mouth surgery (03/28/2016).  Family History:  His family history includes Heart disease in his father; Rheum arthritis in his father and mother.  Social History:  He  reports that he has never smoked. He has never used smokeless tobacco. He reports that he drank alcohol. He reports that he does not use drugs.

## 2017-11-18 NOTE — Patient Instructions (Signed)
Lab tests today  Will schedule pulmonary function test  Follow up in 2 to 3 weeks with Dr. Halford Chessman or Nurse Practitioner

## 2017-11-19 ENCOUNTER — Telehealth: Payer: Self-pay | Admitting: Family Medicine

## 2017-11-19 NOTE — Telephone Encounter (Signed)
Pt need all medication transferred to [facility] Also prescription has to be changed to a 90 day supply because if it isn't changed they won't pay and pt will have to pay 100% of cost by Jan 2020. This isn't urgent pt just want it done by sometime November.  Losartan 100 g Levothyroixine 05 mg Furosemide 40 mg Amlodipine 10 mg Eliquis 5 mg Restatis 05% ophth single use 60 Desoximetasone 05% gel 60 gm

## 2017-11-19 NOTE — Telephone Encounter (Signed)
Pt called back to eliminate Desoximetasone 05% gel 60 gm. This is by another DR.

## 2017-11-21 LAB — ANA: Anti Nuclear Antibody(ANA): NEGATIVE

## 2017-11-21 LAB — RHEUMATOID FACTOR: Rhuematoid fact SerPl-aCnc: <14 IU/mL (ref <14)

## 2017-11-24 ENCOUNTER — Telehealth: Payer: Self-pay | Admitting: Pulmonary Disease

## 2017-11-24 NOTE — Telephone Encounter (Signed)
Lab Results  Component Value Date   ANA NEGATIVE 11/18/2017   RF <14 11/18/2017    Lab Results  Component Value Date   ESRSEDRATE 27 (H) 11/18/2017     Please let him know his lab tests were normal.

## 2017-11-25 MED ORDER — LEVOTHYROXINE SODIUM 50 MCG PO TABS: ORAL_TABLET | ORAL | 3 refills | Status: DC

## 2017-11-25 MED ORDER — FUROSEMIDE 40 MG PO TABS: 40.0000 mg | ORAL_TABLET | Freq: Every day | ORAL | 3 refills | Status: DC

## 2017-11-25 MED ORDER — AMLODIPINE BESYLATE 10 MG PO TABS: 10.0000 mg | ORAL_TABLET | Freq: Every day | ORAL | 3 refills | Status: DC

## 2017-11-25 MED ORDER — APIXABAN 5 MG PO TABS: 5.0000 mg | ORAL_TABLET | Freq: Two times a day (BID) | ORAL | 3 refills | Status: DC

## 2017-11-25 MED ORDER — LOSARTAN POTASSIUM 100 MG PO TABS: 100.0000 mg | ORAL_TABLET | Freq: Every day | ORAL | 3 refills | Status: DC

## 2017-11-25 NOTE — Telephone Encounter (Signed)
Spoke with pt relaying Dr. Synthia Innocent message.  Pt verbalizes understanding and expresses his thanks.

## 2017-11-25 NOTE — Telephone Encounter (Signed)
Called and spoke with patient regarding results.  Informed the patient of results and recommendations today. Pt verbalized understanding and denied any questions or concerns at this time.  Nothing further needed.

## 2017-11-25 NOTE — Telephone Encounter (Signed)
Is it ok to send 90-day refills for these meds?

## 2017-11-25 NOTE — Telephone Encounter (Signed)
Pt's wife called concerning previous message and stated she missed counted and pt only has 4 pills left and need prescriptions transferred asap. Please advise.

## 2017-11-25 NOTE — Telephone Encounter (Signed)
plz notify this was refilled.  I sent in a 90 d supply for eliquis. I did not refill restasis as I don't prescribe this medicine - recommend he check with eye doctor for this prescription.

## 2017-11-26 MED ORDER — LEVOTHYROXINE SODIUM 50 MCG PO TABS: ORAL_TABLET | ORAL | 3 refills | Status: AC

## 2017-11-26 MED ORDER — APIXABAN 5 MG PO TABS: 5.0000 mg | ORAL_TABLET | Freq: Two times a day (BID) | ORAL | 3 refills | Status: AC

## 2017-11-26 MED ORDER — FUROSEMIDE 40 MG PO TABS: 40.0000 mg | ORAL_TABLET | Freq: Every day | ORAL | 3 refills | Status: AC

## 2017-11-26 MED ORDER — LOSARTAN POTASSIUM 100 MG PO TABS: 100.0000 mg | ORAL_TABLET | Freq: Every day | ORAL | 3 refills | Status: AC

## 2017-11-26 MED ORDER — AMLODIPINE BESYLATE 10 MG PO TABS: 10.0000 mg | ORAL_TABLET | Freq: Every day | ORAL | 3 refills | Status: AC

## 2017-11-26 NOTE — Telephone Encounter (Signed)
Sent all refills (previously sent to [facility]) to [facility] [address].  Left message for pt to call back. Need to notify him of above message.

## 2017-11-26 NOTE — Telephone Encounter (Signed)
Pt's wife(Annette) ci stating she would like the prescriptions to be sent [facility] [address].

## 2017-11-26 NOTE — Addendum Note (Signed)
Addended by: Brenton Grills on: 33/08/2503 39:76 PM   Modules accepted: Orders

## 2017-12-17 NOTE — Progress Notes (Signed)
_0  ID: Justin Hester, male    DOB: Sep 27, 1930, 84 y.o.   MRN: [id number]  Chief Complaint  Patient presents with  . Follow-up    PFT today, couldnt complete test today     Referring provider: Ria Bush, MD  HPI:  84 year old male never smoker followed in our office for obstructive sleep apnea (now managed on oral appliance Dr. Oneal Grout) and reevaluate in 11/18/17 for evaluation of chest CT that showed UIP.  PMH: Anemia, Cataract, Constipation, Diverticulosis, DJD, DVT, GERD, HTN, Hypothyroidism, Back pain Smoker/ Smoking History: Never smoker Maintenance: None Pt of: Dr. Halford Chessman   12/18/2017  - Visit   84 year old male patient presenting today for pulmonary function test.  Unfortunately patient was unable to complete his full pulmonary function test today due to repetitive coughing.  Patient has been having occasional dry cough for the past month.  Patient reports it was at its worse today while he was trying to perform spirometry during his pulmonary function test.  Patient denies fever or any sputum color changes.  Patient denies morning cough or sputum production.  12/18/2017-pulmonary function test-unable to complete spirometry due to patient coughing with every exhale, DLCO 42  Patient is no longer using CPAP.  Patient is currently being managed with oral appliance by Dr. Ron Parker.  Patient reports he is getting a home sleep study this week to monitor his status.  Patient reports that he has not had any snoring with using the oral appliance but his teeth and jaw her is waiting for adjustments from Dr. Ron Parker.    Tests:  PSG 06/17/08 >> AHI 50 CPAP11/20/17 to 01/08/16 >>used on 30 of 30 nights with average 7 hrs 35 min. Average AHI 2.3 with median CPAP 10 and 95 th percentile CPAP 13 cm H2O Auto CPAP 04/09/17 to 07/07/17 >> used on 90 of 90 nights with average 7 hrs 23 min.  Average AHI 1.9 with median CPAP 10 and 95 th percentile CPAP 13 cm H2O HST 07/21/17 >> AHI  13.2, SpO2 low 81%  10/03/2017-CT Angio- no evidence of PE, spectrum of findings and lungs compatible with ATS probable UIP, follow-up high-res CT chest can be considered in 12 months, patchy areas of groundglass attenuation, septal thickening, subpleural reticulation, cylindrical traction bronchiectasis, peripheral bronchiolectasis few regions of honeycombing are noted to the lungs bilaterally, mild cranial cardial gradient  FENO:  No results found for: NITRICOXIDE  PFT: PFT Results Latest Ref Rng & Units 12/18/2017  DLCO UNC% % 42  DLCO COR %Predicted % 75  TLC L 4.18  TLC % Predicted % 57  RV % Predicted % 43    Imaging: No results found.  Chart Review:    Specialty Problems      Pulmonary Problems   Pulmonary nodules    H/o multiple pulm nodules by CT 2006 Largely stable to resolved on rpt CT 09/2017      OSA with oral appliance (Dr. Ron Parker)    npsg 2010: AHI 50/hr Auto 2013:  Optimal pressure 12cm 06/2014 on 14cm       Bronchiectasis ([place])    By CT scan 09/2017 - asxs      Interstitial lung disease ([place])    By CT scan 09/2017 - asxs UIP by CTA 09/2017 - consider high resolution CT f/u in 1 year and pulm referral.       Shortness of breath   Cough      Allergies  Allergen Reactions  . Cyclobenzaprine  Rash, chills, shaking  . Oxycodone-Acetaminophen     REACTION: unspecified  . Propoxyphene N-Acetaminophen     REACTION: itching---hot and cold flashes    Immunization History  Administered Date(s) Administered  . Encephalitis 06/14/2014, 07/12/2014, 06/13/2017, 07/11/2017  . Hep A / Hep B 06/09/2014, 07/11/2014, 12/12/2014  . Influenza Split 11/04/2011  . Influenza, Seasonal, Injecte, Preservative Fre 01/09/2014  . Influenza,inj,Quad PF,6+ Mos 11/08/2012, 11/02/2014, 09/25/2015, 09/15/2017  . Influenza,inj,quad, With Preservative 09/27/2016  . Pneumococcal Conjugate-13 04/29/2013  . Pneumococcal Polysaccharide-23 06/14/2014, 06/13/2017  . Tdap  09/03/2012  . Zoster 06/14/2014, 06/13/2017  . Zoster Recombinat (Shingrix) 07/11/2017    Past Medical History:  Diagnosis Date  . Anemia   . Cataract   . Chronic constipation   . Diverticulosis of colon (without mention of hemorrhage)    constipation - new problem 3/12 and work up in progress...  . DJD (degenerative joint disease)   . DVT (deep venous thrombosis) (HCC) 1990s   6 mo coumadin  . GERD (gastroesophageal reflux disease)   . Hypertension   . Hypothyroidism   . Lower leg DVT (deep venous thromboembolism), acute, left ([place]) 06/15/2016  . Lumbar back pain   . Multiple pulmonary nodules 2006  . OSA on CPAP     Tobacco History: Social History   Tobacco Use  Smoking Status Never Smoker  Smokeless Tobacco Never Used   Counseling given: Yes Keep not smoking  Outpatient Encounter Medications as of 12/18/2017  Medication Sig  . acetaminophen (TYLENOL) 500 MG tablet Take 500 mg by mouth every 6 (six) hours as needed.  Marland Kitchen amLODipine (NORVASC) 10 MG tablet Take 1 tablet (10 mg total) by mouth daily.  Marland Kitchen apixaban (ELIQUIS) 5 MG TABS tablet Take 1 tablet (5 mg total) by mouth 2 (two) times daily.  . Desoximetasone 7.03 % GEL 1 application daily.  . furosemide (LASIX) 40 MG tablet Take 1 tablet (40 mg total) by mouth daily.  Marland Kitchen levothyroxine (SYNTHROID, LEVOTHROID) 50 MCG tablet TAKE 1 TABLET BY MOUTH EVERY DAY; EXCEPT 2 TABLETS DAILY ON TUESDAY AND FRIDAY  . losartan (COZAAR) 100 MG tablet Take 1 tablet (100 mg total) by mouth daily.  Marland Kitchen loteprednol (LOTEMAX) 0.5 % ophthalmic suspension Place 1 drop into both eyes 2 times daily.  . polyethylene glycol (MIRALAX / GLYCOLAX) packet Take 17 g by mouth as needed.   . benzonatate (TESSALON) 200 MG capsule Take 1 capsule (200 mg total) by mouth 3 (three) times daily as needed for cough.  . [DISCONTINUED] losartan-hydrochlorothiazide (HYZAAR) 100-25 MG per tablet Take 1 tablet by mouth daily.   No facility-administered encounter  medications on file as of 12/18/2017.      Review of Systems  Review of Systems  Constitutional: Positive for fatigue. Negative for activity change, chills, fever and unexpected weight change.  HENT: Negative for congestion, postnasal drip, rhinorrhea, sneezing and sore throat.   Eyes: Negative.   Respiratory: Positive for cough (occasionally over last month, usually dry, worsened during pft ) and shortness of breath (w/ exertion ). Negative for wheezing.   Cardiovascular: Negative for chest pain and palpitations.  Gastrointestinal: Negative for constipation, diarrhea, nausea and vomiting.  Endocrine: Negative.   Musculoskeletal: Negative.        + Patient reports occasional right chest wall discomfort during certain movements such as getting in and out of the car.  Patient denies any other symptoms at this time.  Skin: Negative.   Neurological: Negative for dizziness and headaches.  Psychiatric/Behavioral: Negative.  Negative for dysphoric mood. The patient is not nervous/anxious.   All other systems reviewed and are negative.    Physical Exam  BP 130/68 (BP Location: Left Arm, Cuff Size: Normal)   Pulse 88   Ht _0  (1.803 m)   Wt 215 lb (97.5 kg)   SpO2 94%   BMI 29.99 kg/m   Wt Readings from Last 5 Encounters:  12/18/17 215 lb (97.5 kg)  11/18/17 217 lb 12.8 oz (98.8 kg)  10/02/17 216 lb 8 oz (98.2 kg)  09/17/17 213 lb 8 oz (96.8 kg)  09/10/17 216 lb (98 kg)     Physical Exam  Constitutional: He is oriented to person, place, and time and well-developed, well-nourished, and in no distress. No distress.  HENT:  Head: Normocephalic and atraumatic.  Right Ear: Hearing, tympanic membrane, external ear and ear canal normal.  Left Ear: Hearing, tympanic membrane, external ear and ear canal normal.  Nose: Nose normal. Right sinus exhibits no maxillary sinus tenderness and no frontal sinus tenderness. Left sinus exhibits no maxillary sinus tenderness and no frontal sinus  tenderness.  Mouth/Throat: Uvula is midline and oropharynx is clear and moist. No oropharyngeal exudate.  Eyes: Pupils are equal, round, and reactive to light.  Neck: Normal range of motion. Neck supple. No JVD present.  Cardiovascular: Normal rate, regular rhythm and normal heart sounds.  Pulmonary/Chest: Effort normal. No accessory muscle usage. No respiratory distress. He has no decreased breath sounds. He has no wheezes. He has no rhonchi. He has rales (Bibasilar crackles).  Abdominal: Soft. Bowel sounds are normal. There is no tenderness.  Musculoskeletal: Normal range of motion. He exhibits no edema.  Lymphadenopathy:    He has no cervical adenopathy.  Neurological: He is alert and oriented to person, place, and time.  Shuffling gait, leaning forward during ambulation today  Skin: Skin is warm and dry. He is not diaphoretic. No erythema.  Psychiatric: Mood, memory, affect and judgment normal.  Nursing note and vitals reviewed.   Walk today in office patient was able to complete 2 laps oxygen levels dropped to 88% from 96% when starting.  Patient was on room air.  Lab Results:  CBC    Component Value Date/Time   WBC 6.3 09/10/2017 1857   RBC 4.37 09/10/2017 1857   HGB 13.6 09/10/2017 1857   HCT 42.2 09/10/2017 1857   PLT 226 09/10/2017 1857   MCV 96.6 09/10/2017 1857   MCH 31.1 09/10/2017 1857   MCHC 32.2 09/10/2017 1857   RDW 12.9 09/10/2017 1857   LYMPHSABS 2.7 09/10/2017 1857   MONOABS 0.8 09/10/2017 1857   EOSABS 0.2 09/10/2017 1857   BASOSABS 0.1 09/10/2017 1857    BMET    Component Value Date/Time   NA 139 09/10/2017 1857   K 4.6 09/10/2017 1857   CL 107 09/10/2017 1857   CO2 25 09/10/2017 1857   GLUCOSE 91 09/10/2017 1857   BUN 16 09/10/2017 1857   CREATININE 1.22 09/10/2017 1857   CALCIUM 8.8 (L) 09/10/2017 1857   GFRNONAA 53 (L) 09/10/2017 1857   GFRAA >60 09/10/2017 1857    BNP No results found for: BNP  ProBNP No results found for:  PROBNP    Assessment & Plan:   Pleasant 84 year old male patient seen office today.  Patient was unable to complete pulmonary function test today.  We were able to achieve a DLCO.  DLCO is 42.  Patient with bibasilar crackles on exam today.  Patient stance on starting anti-fibrotic therapies  has not changed.  Patient is not interested in starting his medications at this time.  Patient reports that his wife may have questions regarding the appointment today and wants to make sure that she can call to ask questions.  That is not a problem.  Walk in office today patient did have drop in oxygen saturations but remained above 88%.  I suspect patient will need oxygen at night.  Patient to receive home sleep study from Dr. Ron Parker this week so we can check his SPO2 status with those results.  Patient to follow-up with our office in 4 to 6 weeks.  OSA with oral appliance (Dr. Ron Parker)  We will follow-up with home sleep study results from Dr. Oneal Grout to see if your oxygen drops at night  Follow-up in our office in 4 to 6 weeks or sooner if symptoms do not improve   Interstitial lung disease ([place]) Walk today in office on RA  >>> Oxygen saturations dropped to 88%  We will follow-up with home sleep study results from Dr. Oneal Grout to see if your oxygen drops at night  Tessalon pearles  >>> 200 mg every 8 hours as needed for cough  Follow-up in our office in 4 to 6 weeks or sooner if symptoms do not improve  If you change your mind regarding starting anti-fibrotic therapy such as OFEV or Esbriet  Please let us know   Cough  Tessalon pearles  >>> 200 mg every 8 hours as needed for cough  Follow-up in our office in 4 to 6 weeks or sooner if symptoms do not improve     This appointment was 32 minutes along with over 50% that time in direct face-to-face patient care, assessment, plan of care follow-up.  Lauraine Rinne, NP 12/18/2017

## 2017-12-18 ENCOUNTER — Encounter: Payer: Self-pay | Admitting: Pulmonary Disease

## 2017-12-18 ENCOUNTER — Telehealth: Payer: Self-pay | Admitting: Pulmonary Disease

## 2017-12-18 ENCOUNTER — Ambulatory Visit (INDEPENDENT_AMBULATORY_CARE_PROVIDER_SITE_OTHER): Payer: Medicare Other | Admitting: Pulmonary Disease

## 2017-12-18 VITALS — BP 130/68 | HR 88 | Ht 71.0 in | Wt 215.0 lb

## 2017-12-18 DIAGNOSIS — J849 Interstitial pulmonary disease, unspecified: Secondary | ICD-10-CM

## 2017-12-18 DIAGNOSIS — Z9989 Dependence on other enabling machines and devices: Secondary | ICD-10-CM | POA: Diagnosis not present

## 2017-12-18 DIAGNOSIS — R059 Cough, unspecified: Secondary | ICD-10-CM | POA: Insufficient documentation

## 2017-12-18 DIAGNOSIS — G4733 Obstructive sleep apnea (adult) (pediatric): Secondary | ICD-10-CM

## 2017-12-18 DIAGNOSIS — R05 Cough: Secondary | ICD-10-CM | POA: Diagnosis not present

## 2017-12-18 LAB — PULMONARY FUNCTION TEST
DL/VA % pred: 75 %
DL/VA: 3.5 ml/min/mmHg/L
DLCO unc % pred: 42 %
DLCO unc: 14.44 ml/min/mmHg
RV % pred: 43 %
RV: 1.23 L
TLC % pred: 57 %
TLC: 4.18 L

## 2017-12-18 MED ORDER — BENZONATATE 200 MG PO CAPS: 200.0000 mg | ORAL_CAPSULE | Freq: Three times a day (TID) | ORAL | 1 refills | Status: AC | PRN

## 2017-12-18 NOTE — Telephone Encounter (Signed)
Spoke with pt's wife, I tried to get the questions she needed answered from [name] but she wants to speak to [name] about the whole visit and why he was here. She stated that her husband stated Justin Hester would be ok talking to her if she needed to. Justin Hester please call.

## 2017-12-18 NOTE — Telephone Encounter (Signed)
12/18/17 1255  Returned call to patient spouse. Pt spouse is confused regarding patients follow up with our office.  I was able to discuss with patient spouse September/2019 CT that showed a UIP pattern.  I was able to discuss DLCO results from patient's pulmonary function test today showing a DLCO of 42.  Unfortunately patient was unable to complete spirometry due to coughing.  I discussed Tessalon use and indication with patient spouse.  Patient spouse confirms that patient does have a home sleep study this week with Dr. Ron Parker in order to evaluate oral appliance use.  Explained to patient spouse that we will use that test to evaluate whether or not patient has oxygen desaturations whether or not he needs nighttime oxygen use due to interstitial lung disease.  I have encouraged patient spouse to present to next office visit on 01/15/2018 with patient so we can further discuss therapies as well as treatment plan moving forward.  I have emphasized and repeatedly stated to the patient spouse that this is the patient's decision.  And currently right now the patient does not want to move forward with anti-fibrotic therapies.  Patient spouse agrees and says she will discuss this with her husband.  All questions were answered.  I explained to patient spouse as well that if patient does decide he wants to start anti-fibrotic therapy or would like to present to our office sooner than 01/15/2018 I am more than happy to see him as well as I am sure Dr. Halford Chessman to be more than happy to see the patient as well.  Wyn Quaker FNP

## 2017-12-18 NOTE — Patient Instructions (Addendum)
Walk today in office on RA  >>> Oxygen saturations dropped to 88%  We will follow-up with home sleep study results from Dr. Oneal Grout to see if your oxygen drops at night  Tessalon pearles  >>> 200 mg every 8 hours as needed for cough  Follow-up in our office in 4 to 6 weeks or sooner if symptoms do not improve  If you change your mind regarding starting anti-fibrotic therapy such as OFEV or Esbriet  Please let us know  It is flu season:   >>>Remember to be washing your hands regularly, using hand sanitizer, be careful to use around herself with has contact with people who are sick will increase her chances of getting sick yourself. >>> Best ways to protect herself from the flu: Receive the yearly flu vaccine, practice good hand hygiene washing with soap and also using hand sanitizer when available, eat a nutritious meals, get adequate rest, hydrate appropriately   Please contact the office if your symptoms worsen or you have concerns that you are not improving.   Thank you for choosing [facility] for your healthcare, and for allowing [place] to partner with you on your healthcare journey. I am thankful to be able to provide care to you today.   Wyn Quaker FNP-C

## 2017-12-18 NOTE — Assessment & Plan Note (Signed)
  We will follow-up with home sleep study results from Dr. Oneal Grout to see if your oxygen drops at night  Follow-up in our office in 4 to 6 weeks or sooner if symptoms do not improve

## 2017-12-18 NOTE — Assessment & Plan Note (Signed)
Walk today in office on RA  >>> Oxygen saturations dropped to 88%  We will follow-up with home sleep study results from Dr. Oneal Grout to see if your oxygen drops at night  Tessalon pearles  >>> 200 mg every 8 hours as needed for cough  Follow-up in our office in 4 to 6 weeks or sooner if symptoms do not improve  If you change your mind regarding starting anti-fibrotic therapy such as OFEV or Esbriet  Please let us know

## 2017-12-18 NOTE — Assessment & Plan Note (Signed)
  Tessalon pearles  >>> 200 mg every 8 hours as needed for cough  Follow-up in our office in 4 to 6 weeks or sooner if symptoms do not improve

## 2017-12-18 NOTE — Progress Notes (Signed)
Dlco and Pleth done today.

## 2017-12-27 ENCOUNTER — Encounter: Payer: Self-pay | Admitting: Adult Health

## 2017-12-29 ENCOUNTER — Other Ambulatory Visit: Payer: Self-pay

## 2017-12-29 ENCOUNTER — Emergency Department (HOSPITAL_COMMUNITY): Payer: Medicare Other

## 2017-12-29 ENCOUNTER — Encounter: Payer: Self-pay | Admitting: Adult Health

## 2017-12-29 ENCOUNTER — Encounter (HOSPITAL_COMMUNITY): Payer: Self-pay | Admitting: Obstetrics and Gynecology

## 2017-12-29 ENCOUNTER — Ambulatory Visit (INDEPENDENT_AMBULATORY_CARE_PROVIDER_SITE_OTHER): Payer: Medicare Other | Admitting: Adult Health

## 2017-12-29 ENCOUNTER — Inpatient Hospital Stay (HOSPITAL_COMMUNITY)
Admission: EM | Admit: 2017-12-29 | Discharge: 2018-01-20 | DRG: 870 | Disposition: E | Payer: Medicare Other | Source: Ambulatory Visit | Attending: Pulmonary Disease | Admitting: Pulmonary Disease

## 2017-12-29 ENCOUNTER — Ambulatory Visit: Payer: Medicare Other | Admitting: Pulmonary Disease

## 2017-12-29 DIAGNOSIS — K219 Gastro-esophageal reflux disease without esophagitis: Secondary | ICD-10-CM | POA: Diagnosis present

## 2017-12-29 DIAGNOSIS — Z8249 Family history of ischemic heart disease and other diseases of the circulatory system: Secondary | ICD-10-CM

## 2017-12-29 DIAGNOSIS — G931 Anoxic brain damage, not elsewhere classified: Secondary | ICD-10-CM | POA: Diagnosis not present

## 2017-12-29 DIAGNOSIS — J9383 Other pneumothorax: Secondary | ICD-10-CM | POA: Diagnosis not present

## 2017-12-29 DIAGNOSIS — Z79890 Hormone replacement therapy (postmenopausal): Secondary | ICD-10-CM

## 2017-12-29 DIAGNOSIS — D649 Anemia, unspecified: Secondary | ICD-10-CM | POA: Diagnosis present

## 2017-12-29 DIAGNOSIS — Z66 Do not resuscitate: Secondary | ICD-10-CM | POA: Diagnosis not present

## 2017-12-29 DIAGNOSIS — T797XXA Traumatic subcutaneous emphysema, initial encounter: Secondary | ICD-10-CM

## 2017-12-29 DIAGNOSIS — Z4682 Encounter for fitting and adjustment of non-vascular catheter: Secondary | ICD-10-CM | POA: Diagnosis not present

## 2017-12-29 DIAGNOSIS — J9601 Acute respiratory failure with hypoxia: Secondary | ICD-10-CM

## 2017-12-29 DIAGNOSIS — Z7901 Long term (current) use of anticoagulants: Secondary | ICD-10-CM

## 2017-12-29 DIAGNOSIS — J189 Pneumonia, unspecified organism: Secondary | ICD-10-CM | POA: Diagnosis present

## 2017-12-29 DIAGNOSIS — J9621 Acute and chronic respiratory failure with hypoxia: Secondary | ICD-10-CM | POA: Diagnosis present

## 2017-12-29 DIAGNOSIS — D6859 Other primary thrombophilia: Secondary | ICD-10-CM | POA: Diagnosis present

## 2017-12-29 DIAGNOSIS — I82442 Acute embolism and thrombosis of left tibial vein: Secondary | ICD-10-CM | POA: Diagnosis not present

## 2017-12-29 DIAGNOSIS — R4020 Unspecified coma: Secondary | ICD-10-CM | POA: Diagnosis not present

## 2017-12-29 DIAGNOSIS — J939 Pneumothorax, unspecified: Secondary | ICD-10-CM

## 2017-12-29 DIAGNOSIS — R29727 NIHSS score 27: Secondary | ICD-10-CM | POA: Diagnosis not present

## 2017-12-29 DIAGNOSIS — G9341 Metabolic encephalopathy: Secondary | ICD-10-CM | POA: Diagnosis not present

## 2017-12-29 DIAGNOSIS — D696 Thrombocytopenia, unspecified: Secondary | ICD-10-CM | POA: Diagnosis not present

## 2017-12-29 DIAGNOSIS — J84112 Idiopathic pulmonary fibrosis: Secondary | ICD-10-CM | POA: Diagnosis present

## 2017-12-29 DIAGNOSIS — E870 Hyperosmolality and hypernatremia: Secondary | ICD-10-CM | POA: Diagnosis not present

## 2017-12-29 DIAGNOSIS — Z8672 Personal history of thrombophlebitis: Secondary | ICD-10-CM

## 2017-12-29 DIAGNOSIS — Z8261 Family history of arthritis: Secondary | ICD-10-CM

## 2017-12-29 DIAGNOSIS — I82411 Acute embolism and thrombosis of right femoral vein: Secondary | ICD-10-CM | POA: Diagnosis not present

## 2017-12-29 DIAGNOSIS — Z9842 Cataract extraction status, left eye: Secondary | ICD-10-CM

## 2017-12-29 DIAGNOSIS — M7989 Other specified soft tissue disorders: Secondary | ICD-10-CM | POA: Diagnosis not present

## 2017-12-29 DIAGNOSIS — Z515 Encounter for palliative care: Secondary | ICD-10-CM | POA: Diagnosis not present

## 2017-12-29 DIAGNOSIS — Z575 Occupational exposure to toxic agents in other industries: Secondary | ICD-10-CM

## 2017-12-29 DIAGNOSIS — J849 Interstitial pulmonary disease, unspecified: Secondary | ICD-10-CM | POA: Diagnosis not present

## 2017-12-29 DIAGNOSIS — G259 Extrapyramidal and movement disorder, unspecified: Secondary | ICD-10-CM | POA: Diagnosis not present

## 2017-12-29 DIAGNOSIS — I82531 Chronic embolism and thrombosis of right popliteal vein: Secondary | ICD-10-CM | POA: Diagnosis present

## 2017-12-29 DIAGNOSIS — R0600 Dyspnea, unspecified: Secondary | ICD-10-CM

## 2017-12-29 DIAGNOSIS — I2729 Other secondary pulmonary hypertension: Secondary | ICD-10-CM | POA: Diagnosis not present

## 2017-12-29 DIAGNOSIS — R140 Abdominal distension (gaseous): Secondary | ICD-10-CM | POA: Diagnosis not present

## 2017-12-29 DIAGNOSIS — R0602 Shortness of breath: Secondary | ICD-10-CM | POA: Diagnosis not present

## 2017-12-29 DIAGNOSIS — I2510 Atherosclerotic heart disease of native coronary artery without angina pectoris: Secondary | ICD-10-CM | POA: Diagnosis present

## 2017-12-29 DIAGNOSIS — Z888 Allergy status to other drugs, medicaments and biological substances status: Secondary | ICD-10-CM

## 2017-12-29 DIAGNOSIS — G8191 Hemiplegia, unspecified affecting right dominant side: Secondary | ICD-10-CM | POA: Diagnosis not present

## 2017-12-29 DIAGNOSIS — I2723 Pulmonary hypertension due to lung diseases and hypoxia: Secondary | ICD-10-CM | POA: Diagnosis present

## 2017-12-29 DIAGNOSIS — R6520 Severe sepsis without septic shock: Secondary | ICD-10-CM | POA: Diagnosis not present

## 2017-12-29 DIAGNOSIS — Z86718 Personal history of other venous thrombosis and embolism: Secondary | ICD-10-CM | POA: Diagnosis not present

## 2017-12-29 DIAGNOSIS — Z79899 Other long term (current) drug therapy: Secondary | ICD-10-CM

## 2017-12-29 DIAGNOSIS — K5904 Chronic idiopathic constipation: Secondary | ICD-10-CM | POA: Diagnosis present

## 2017-12-29 DIAGNOSIS — N179 Acute kidney failure, unspecified: Secondary | ICD-10-CM | POA: Diagnosis present

## 2017-12-29 DIAGNOSIS — A419 Sepsis, unspecified organism: Principal | ICD-10-CM | POA: Diagnosis present

## 2017-12-29 DIAGNOSIS — I82441 Acute embolism and thrombosis of right tibial vein: Secondary | ICD-10-CM | POA: Diagnosis not present

## 2017-12-29 DIAGNOSIS — Z885 Allergy status to narcotic agent status: Secondary | ICD-10-CM

## 2017-12-29 DIAGNOSIS — I313 Pericardial effusion (noninflammatory): Secondary | ICD-10-CM | POA: Diagnosis present

## 2017-12-29 DIAGNOSIS — J9690 Respiratory failure, unspecified, unspecified whether with hypoxia or hypercapnia: Secondary | ICD-10-CM | POA: Diagnosis not present

## 2017-12-29 DIAGNOSIS — Z96653 Presence of artificial knee joint, bilateral: Secondary | ICD-10-CM | POA: Diagnosis present

## 2017-12-29 DIAGNOSIS — I119 Hypertensive heart disease without heart failure: Secondary | ICD-10-CM | POA: Diagnosis present

## 2017-12-29 DIAGNOSIS — Z9841 Cataract extraction status, right eye: Secondary | ICD-10-CM

## 2017-12-29 DIAGNOSIS — R918 Other nonspecific abnormal finding of lung field: Secondary | ICD-10-CM | POA: Diagnosis not present

## 2017-12-29 DIAGNOSIS — G4733 Obstructive sleep apnea (adult) (pediatric): Secondary | ICD-10-CM | POA: Diagnosis present

## 2017-12-29 DIAGNOSIS — E039 Hypothyroidism, unspecified: Secondary | ICD-10-CM | POA: Diagnosis present

## 2017-12-29 DIAGNOSIS — F05 Delirium due to known physiological condition: Secondary | ICD-10-CM | POA: Diagnosis not present

## 2017-12-29 DIAGNOSIS — R041 Hemorrhage from throat: Secondary | ICD-10-CM | POA: Diagnosis not present

## 2017-12-29 DIAGNOSIS — Z9689 Presence of other specified functional implants: Secondary | ICD-10-CM

## 2017-12-29 DIAGNOSIS — J8410 Pulmonary fibrosis, unspecified: Secondary | ICD-10-CM | POA: Diagnosis not present

## 2017-12-29 DIAGNOSIS — I6389 Other cerebral infarction: Secondary | ICD-10-CM | POA: Diagnosis not present

## 2017-12-29 DIAGNOSIS — J9312 Secondary spontaneous pneumothorax: Secondary | ICD-10-CM | POA: Diagnosis not present

## 2017-12-29 DIAGNOSIS — I639 Cerebral infarction, unspecified: Secondary | ICD-10-CM | POA: Diagnosis not present

## 2017-12-29 DIAGNOSIS — I6310 Cerebral infarction due to embolism of unspecified precerebral artery: Secondary | ICD-10-CM | POA: Diagnosis not present

## 2017-12-29 DIAGNOSIS — I63332 Cerebral infarction due to thrombosis of left posterior cerebral artery: Secondary | ICD-10-CM | POA: Diagnosis not present

## 2017-12-29 DIAGNOSIS — Z4659 Encounter for fitting and adjustment of other gastrointestinal appliance and device: Secondary | ICD-10-CM

## 2017-12-29 DIAGNOSIS — E119 Type 2 diabetes mellitus without complications: Secondary | ICD-10-CM | POA: Diagnosis present

## 2017-12-29 DIAGNOSIS — J982 Interstitial emphysema: Secondary | ICD-10-CM | POA: Diagnosis not present

## 2017-12-29 DIAGNOSIS — I340 Nonrheumatic mitral (valve) insufficiency: Secondary | ICD-10-CM | POA: Diagnosis not present

## 2017-12-29 DIAGNOSIS — I361 Nonrheumatic tricuspid (valve) insufficiency: Secondary | ICD-10-CM | POA: Diagnosis not present

## 2017-12-29 DIAGNOSIS — Z0189 Encounter for other specified special examinations: Secondary | ICD-10-CM

## 2017-12-29 LAB — CBC WITH DIFFERENTIAL/PLATELET
Abs Immature Granulocytes: 0.04 10*3/uL (ref 0.00–0.07)
Basophils Absolute: 0 10*3/uL (ref 0.0–0.1)
Basophils Relative: 0 %
Eosinophils Absolute: 0.2 10*3/uL (ref 0.0–0.5)
Eosinophils Relative: 2 %
HCT: 40.1 % (ref 39.0–52.0)
Hemoglobin: 12.9 g/dL — ABNORMAL LOW (ref 13.0–17.0)
Immature Granulocytes: 0 %
Lymphocytes Relative: 20 %
Lymphs Abs: 2 10*3/uL (ref 0.7–4.0)
MCH: 30.9 pg (ref 26.0–34.0)
MCHC: 32.2 g/dL (ref 30.0–36.0)
MCV: 96.2 fL (ref 80.0–100.0)
MONO ABS: 1.3 10*3/uL — AB (ref 0.1–1.0)
Monocytes Relative: 13 %
Neutro Abs: 6.4 10*3/uL (ref 1.7–7.7)
Neutrophils Relative %: 65 %
Platelets: 305 10*3/uL (ref 150–400)
RBC: 4.17 MIL/uL — ABNORMAL LOW (ref 4.22–5.81)
RDW: 12.6 % (ref 11.5–15.5)
WBC: 9.9 10*3/uL (ref 4.0–10.5)
nRBC: 0 % (ref 0.0–0.2)

## 2017-12-29 LAB — CG4 I-STAT (LACTIC ACID): Lactic Acid, Venous: 1.28 mmol/L (ref 0.5–1.9)

## 2017-12-29 LAB — URINALYSIS, ROUTINE W REFLEX MICROSCOPIC
Bacteria, UA: NONE SEEN
Bilirubin Urine: NEGATIVE
Glucose, UA: NEGATIVE mg/dL
KETONES UR: NEGATIVE mg/dL
Leukocytes, UA: NEGATIVE
Nitrite: NEGATIVE
Protein, ur: NEGATIVE mg/dL
Specific Gravity, Urine: 1.02 (ref 1.005–1.030)
pH: 5 (ref 5.0–8.0)

## 2017-12-29 LAB — COMPREHENSIVE METABOLIC PANEL
ALT: 24 U/L (ref 0–44)
AST: 20 U/L (ref 15–41)
Albumin: 3.3 g/dL — ABNORMAL LOW (ref 3.5–5.0)
Alkaline Phosphatase: 53 U/L (ref 38–126)
Anion gap: 11 (ref 5–15)
BILIRUBIN TOTAL: 1.1 mg/dL (ref 0.3–1.2)
BUN: 17 mg/dL (ref 8–23)
CO2: 23 mmol/L (ref 22–32)
Calcium: 8.5 mg/dL — ABNORMAL LOW (ref 8.9–10.3)
Chloride: 105 mmol/L (ref 98–111)
Creatinine, Ser: 1.34 mg/dL — ABNORMAL HIGH (ref 0.61–1.24)
GFR calc Af Amer: 56 mL/min — ABNORMAL LOW (ref >60)
GFR calc non Af Amer: 48 mL/min — ABNORMAL LOW (ref >60)
Glucose, Bld: 93 mg/dL (ref 70–99)
Potassium: 4.3 mmol/L (ref 3.5–5.1)
Sodium: 139 mmol/L (ref 135–145)
Total Protein: 8 g/dL (ref 6.5–8.1)

## 2017-12-29 LAB — I-STAT CG4 LACTIC ACID, ED: Lactic Acid, Venous: 1.09 mmol/L (ref 0.5–1.9)

## 2017-12-29 MED ORDER — SODIUM CHLORIDE 0.9 % IV SOLN
500.0000 mg | Freq: Once | INTRAVENOUS | Status: AC
  Administered 2017-12-29: 500 mg via INTRAVENOUS
  Filled 2017-12-29: qty 500

## 2017-12-29 MED ORDER — ACETAMINOPHEN 500 MG PO TABS
1000.0000 mg | ORAL_TABLET | Freq: Once | ORAL | Status: AC
  Administered 2017-12-29: 1000 mg via ORAL
  Filled 2017-12-29: qty 2

## 2017-12-29 MED ORDER — ALBUTEROL SULFATE (2.5 MG/3ML) 0.083% IN NEBU: 5.0000 mg | INHALATION_SOLUTION | Freq: Once | RESPIRATORY_TRACT | Status: DC

## 2017-12-29 MED ORDER — SODIUM CHLORIDE 0.9 % IV SOLN
1.0000 g | Freq: Once | INTRAVENOUS | Status: AC
  Administered 2017-12-29: 1 g via INTRAVENOUS
  Filled 2017-12-29: qty 10

## 2017-12-29 NOTE — Progress Notes (Signed)
Reviewed and agree with assessment/plan.   Gara Kincade, MD [facility] 01/16/2016, 12:24 PM Pager:  [phone]  

## 2017-12-29 NOTE — ED Notes (Signed)
Coming from [facility] pulmonology, states O2 in there 80's-unable to directly admit due to no beds

## 2017-12-29 NOTE — Patient Instructions (Signed)
Go to ER for further evaluation .

## 2017-12-29 NOTE — Assessment & Plan Note (Addendum)
Appears to be having acute flare ? Etiology Possible  Viral illness, pneumonia., etc .   Patient has new onset hypoxemia with O2 saturations at 80%.  He has significant symptom burden with shortness of breath.  With new onset hypoxemia will need oxygen and further evaluation.  Would like patient to be admitted to the hospital unfortunately there are no available direct admit beds at either local hospital.  Patient will be directed to the emergency room for further evaluation treatment and probable hospitalization xopenex neb given in office .  Case discussed with Dr. Claudia Desanctis .  Pt to go to ER via private vehicle per patient request with wife    Plan  Go to ER for admission .

## 2017-12-29 NOTE — H&P (Addendum)
History and Physical    Justin Hester FFM:[id number] DOB: [birth year] DOA: 12/22/2017  PCP: Ria Bush, MD   Patient coming from: Home  I have personally briefly reviewed patient's old medical records in [place]  Chief Complaint:  Shortness of breath.  HPI: Justin Hester is a 84 y.o. male with medical history significant ILD, OSA, Recurrent DVT on Eliquis, history of Antithrombin III deficiency, CAD.  Presented to the ED with complaints of increasing shortness of breath over the past week.  Reports worsening dry cough.  Compliant with Eliquis twice daily and Lasix 40 daily.  Not on home O2.  No recent admissions.  No change in lower extremity leg swelling.  Chest pain.  Patient saw his pulmonologist today for OSA follow-up and new evaluation for ILD and bronchiectasis, subsequently sent to the ED as O2 sats was 83%.   ED Course: Temperature 101 point O2 sats 83% on room air tachypneic to 24. At the time of my evaluatio,n blood pressure 106/56.  WBC is normal at 9.9, normal lactic acid- 1.28.  Chest x-ray is acute on chronic infiltrates R>L.  EKG-QTC 400, nonspecific T wave abnormalities 3 and aVL only.  IV ceftriaxone and azithromycin ordered for community-acquired pneumonia.  Review of Systems: As per HPI all other systems reviewed are negative.  Past Medical History:  Diagnosis Date  . Anemia   . Cataract   . Chronic constipation   . Diverticulosis of colon (without mention of hemorrhage)    constipation - new problem 3/12 and work up in progress...  . DJD (degenerative joint disease)   . DVT (deep venous thrombosis) (HCC) 1990s   6 mo coumadin  . GERD (gastroesophageal reflux disease)   . Hypertension   . Hypothyroidism   . Lower leg DVT (deep venous thromboembolism), acute, left ([place]) 06/15/2016  . Lumbar back pain   . Multiple pulmonary nodules 2006  . OSA on CPAP     Past Surgical History:  Procedure Laterality Date  . CATARACT EXTRACTION, BILATERAL      by Dr. Ricki Miller  . COLONOSCOPY  2012   diverticulosis o/w WNL [name])  . MOUTH SURGERY  03/28/2016   gum surgery  . TOTAL KNEE ARTHROPLASTY  left 9/06, right 10/07   Dr. Sharol Given     reports that he has never smoked. He has never used smokeless tobacco. He reports that he drank alcohol. He reports that he does not use drugs.  Allergies  Allergen Reactions  . Cyclobenzaprine     Rash, chills, shaking  . Oxycodone-Acetaminophen     REACTION: unspecified  . Propoxyphene N-Acetaminophen     REACTION: itching---hot and cold flashes    Family History  Problem Relation Age of Onset  . Heart disease Father   . Rheum arthritis Father   . Rheum arthritis Mother   . Cancer Neg Hx     Prior to Admission medications   Medication Sig Start Date End Date Taking? Authorizing Provider  acetaminophen (TYLENOL) 500 MG tablet Take 500 mg by mouth every 6 (six) hours as needed for mild pain.    Yes [provider]  amLODipine (NORVASC) 10 MG tablet Take 1 tablet (10 mg total) by mouth daily. 11/26/17  Yes Ria Bush, MD  apixaban (ELIQUIS) 5 MG TABS tablet Take 1 tablet (5 mg total) by mouth 2 (two) times daily. 11/26/17  Yes Ria Bush, MD  benzonatate (TESSALON) 200 MG capsule Take 1 capsule (200 mg total) by mouth 3 (three) times daily  as needed for cough. 12/18/17  Yes Lauraine Rinne, NP  cycloSPORINE (RESTASIS) 0.05 % ophthalmic emulsion Place 1 drop into both eyes 2 (two) times daily.   Yes [provider]  Desoximetasone 0.05 % GEL Apply 1 application topically daily.    Yes [provider]  furosemide (LASIX) 40 MG tablet Take 1 tablet (40 mg total) by mouth daily. 11/26/17  Yes Ria Bush, MD  levothyroxine (SYNTHROID, LEVOTHROID) 50 MCG tablet TAKE 1 TABLET BY MOUTH EVERY DAY; EXCEPT 2 TABLETS DAILY ON TUESDAY AND FRIDAY Patient taking differently: TAKE 1 TABLET BY MOUTH EVERY DAY; EXCEPT 2 TABLETS DAILY ON Monday AND FRIDAY 11/26/17  Yes  Ria Bush, MD  losartan (COZAAR) 100 MG tablet Take 1 tablet (100 mg total) by mouth daily. 11/26/17  Yes Ria Bush, MD  polyethylene glycol [facility] / Floria Raveling) packet Take 17 g by mouth as needed.    Yes [provider]  losartan-hydrochlorothiazide (HYZAAR) 100-25 MG per tablet Take 1 tablet by mouth daily. 03/27/11 04/03/11  Noralee Space, MD    Physical Exam: Vitals:   01/01/2018 1930 12/28/2017 1959 01/01/2018 2004 12/24/2017 2035  BP: 139/75  (!) 127/49 125/62  Pulse: 98  98 (!) 102  Resp: (!) 24  (!) 24 (!) 21  Temp:  (!) 101 F (38.3 C)    TempSrc:  Rectal    SpO2: 92%  92% 95%    Constitutional: NAD, calm, comfortable Vitals:   12/26/2017 1930 01/05/2018 1959 01/02/2018 2004 01/11/2018 2035  BP: 139/75  (!) 127/49 125/62  Pulse: 98  98 (!) 102  Resp: (!) 24  (!) 24 (!) 21  Temp:  (!) 101 F (38.3 C)    TempSrc:  Rectal    SpO2: 92%  92% 95%   Eyes: PERRL, lids and conjunctivae normal ENMT: Mucous membranes are moist. Posterior pharynx clear of any exudate or lesions.  Neck: normal, supple, no masses, no thyromegaly Respiratory: Crackles bilateral bases, no wheezing, no crackles. Normal respiratory effort. No accessory muscle use.  Cardiovascular: Regular rate and rhythm, no murmurs / rubs / gallops. Trace extremity edema- left, chronic, compressions stockings present. 2+ pedal pulses.  Abdomen: no tenderness, no masses palpated. No hepatosplenomegaly. Bowel sounds positive.  Musculoskeletal: no clubbing / cyanosis. No joint deformity upper and lower extremities. Good ROM, no contractures. Normal muscle tone.  Skin: no rashes, lesions, ulcers. No induration Neurologic: CN 2-12 grossly intact.  Strength 5/5 in all 4.  Psychiatric: Normal judgment and insight. Alert and oriented x 3. Normal mood.   Labs on Admission: I have personally reviewed following labs and imaging studies  CBC: Recent Labs  Lab 12/22/2017 1805  WBC 9.9  NEUTROABS 6.4  HGB 12.9*  HCT  40.1  MCV 96.2  PLT 832   Basic Metabolic Panel: Recent Labs  Lab 12/26/2017 1805  NA 139  K 4.3  CL 105  CO2 23  GLUCOSE 93  BUN 17  CREATININE 1.34*  CALCIUM 8.5*   GFR: Estimated Creatinine Clearance: 47.6 mL/min (A) (by C-G formula based on SCr of 1.34 mg/dL (H)). Liver Function Tests: Recent Labs  Lab 12/28/2017 1805  AST 20  ALT 24  ALKPHOS 53  BILITOT 1.1  PROT 8.0  ALBUMIN 3.3*   Urine analysis:    Component Value Date/Time   COLORURINE YELLOW 01/11/2018 1805   APPEARANCEUR CLEAR 01/07/2018 1805   LABSPEC 1.020 01/19/2018 1805   PHURINE 5.0 12/28/2017 1805   GLUCOSEU NEGATIVE 12/24/2017 1805  GLUCOSEU NEGATIVE 12/06/2008 0000   HGBUR SMALL (A) 12/28/2017 1805   BILIRUBINUR NEGATIVE 01/01/2018 1805   KETONESUR NEGATIVE 01/10/2018 1805   PROTEINUR NEGATIVE 01/17/2018 1805   UROBILINOGEN 0.2 12/06/2008 0000   NITRITE NEGATIVE 01/11/2018 1805   LEUKOCYTESUR NEGATIVE 01/03/2018 1805    Radiological Exams on Admission: Dg Chest 2 View  Result Date: 01/15/2018 CLINICAL DATA:  Shortness of breath EXAM: CHEST - 2 VIEW COMPARISON:  10/03/2017 FINDINGS: Cardiac shadow is mildly enlarged. Diffuse fibrotic changes are again identified throughout both lungs similar to that seen on prior CT examination. There are however patchy infiltrative opacities in both lungs slightly worse on the right than the left particularly in the upper and lower lobes. Similar changes in the left base are noted. No sizable effusion is noted. No bony abnormality is seen. IMPRESSION: Acute on chronic infiltrates in both lungs slightly worse on the right than the left. Electronically Signed   By: Inez Catalina M.D.   On: 12/20/2017 17:37    EKG: Independently reviewed.  Nonspecific T wave abnormalities 3 and aVF only.  No old EKG on file to compare.  Assessment/Plan Active Problems:   CAP (community acquired pneumonia)    Community-acquired pneumonia-fever 101.2, SOB, Tachypneic, Hypoxia-   83% on RA, improved with 2L o2.  Soft blood pressure. Chest x-ray with right greater than left acute on chronic infiltrates.  UA clean.  WBC 9.9.  Meet SIRS criteria.  Lactic acid normal 1.2. -Continue IV ceftriaxone and azithromycin started in ED -F/u blood cultures -BMP CBC a.m. -Mucolytic's, supplemental O2 -DuoNebs PRN -Hold home antihypertensives - N/s 100cc/hr x 12 hrs.  Interstitial lung disease, OSA-follows with [facility] pulmonology.  Not on chronic O2. Uses an oral appliance to sleep.  Recurrent DVT-history of Antithrombin III deficiency on lifelong anticoagulation -Continue home Eliquis  CAD, Pericardial effusion-chest pain.  EKG with nonspecific T wave abnormalities 3 and aVF only.  Appears these are old but I do not see an old EKG. Denies prior stents or cardiac problems. Chest CT- 08/2017 with moderate CAD.  Hypothyroidism-  -Continue home Synthroid 80 mcg daily  HTN-systolic 329/92 at the time of my evaluation. -Hold home antihypertensives Lasix  DVT prophylaxis: Eliquis Code Status: Full Family Communication: Spouse at bedside Disposition Plan: Per rounding team Consults called: None Admission status: Inpt, Tele   Bethena Roys MD [facility] Pager [phone] From 6PM-2AM.  Otherwise please contact night-coverage [email or web address] Password [facility]   12/20/2017, 8:42 PM

## 2017-12-29 NOTE — Progress Notes (Signed)
_0  ID: Justin Hester, Justin Hester    DOB: [birth year], 84 y.o.   MRN: [id number]  Chief Complaint  Patient presents with  . Acute Visit    ILD     Referring provider: Ria Bush, MD  HPI: 84 year old Justin Hester never smoker followed for obstructive sleep apnea, and new pulmonary consult for abnormal CT chest with ILD and shortness of breath November 18, 2017 Medical history significant for recurrent DVT on Eliquis, history of Antithrombin III deficiency  TEST/EVENTS :  PFT December 18, 2017 was unable to complete due to coughing, El[place]ia 42  PSG 06/17/08 >> AHI 50 CPAP11/20/17 to 01/08/16 >>used on 30 of 30 nights with average 7 hrs 35 min. Average AHI 2.3 with median CPAP 10 and 95 th percentile CPAP 13 cm H2O Auto CPAP3/21/19 to 07/07/17 >>used on 90 of 90 nights with average 7 hrs 23 min. Average AHI 1.9 with median CPAP 10 and 95 th percentile CPAP 13 cm H2O HST 07/21/17 >> AHI 13.2, SpO2 low 81%  10/03/2017-CT Angio- no evidence of PE, spectrum of findings and lungs compatible with ATS probable UIP, follow-up high-res CT chest can be considered in 12 months, patchy areas of groundglass attenuation, septal thickening, subpleural reticulation, cylindrical traction bronchiectasis, peripheral bronchiolectasis few regions of honeycombing are noted to the lungs bilaterally, mild cranial cardial gradient  12/28/2017 Acute OV : ILD  Patient presents for an acute office visit.  Patient complains over the last 2 days he has had an acute decline in his breathing status.  He has had increased cough, severe coughing paroxysms, low-grade fever, chills, increased shortness of breath.  On arrival to the office today O2 saturations were 80% on room air.  Patient was placed on oxygen at 2 L with O2 saturations to 93%.  Patient denies any hemoptysis, chest pain, orthopnea or increased leg swelling. Patient is very short of breath in the office and is having a hard time recovering after severe  coughing paroxysms..  Patient has recently been found to have interstitial lung disease with probable UIP on CT chest September 2019.  Over the last 3 months he has had a progressive decline with increased shortness of breath with minimum activity.  Previously patient was very active and exercised on a regular basis but is not able to do hardly any activity now.  Labs showed a negative ANA and RA factor.  ESR was minimally elevated at 27.Marland Kitchen He was unable to complete a PFT due to excessive coughing.  Patient has a daily dry cough.  Patient was in the [employer] for greater than 23 years.  He was in the [employer].  Said he was exposed to a lot of chemicals.  He has had a few episodes of bronchitis and pneumonia in the past.  Never smoker.  Denies any hot tub, basement, pets, birds or chicken exposure.  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  Patient has known obstructive sleep apnea.  Has recently changed to oral appliance.  He had a follow-up sleep study with oral appliance.  That showed no residual events.  However showed nocturnal desaturations with average O2 saturation 85% and lowest O2 saturation 68%.   does have a history of recurrent DVT is on Eliquis.  He denies any known bleeding.  Allergies  Allergen Reactions  . Cyclobenzaprine     Rash, chills, shaking  . Oxycodone-Acetaminophen     REACTION: unspecified  . Propoxyphene N-Acetaminophen     REACTION: itching---hot and cold flashes  Immunization History  Administered Date(s) Administered  . Encephalitis 06/14/2014, 07/12/2014, 06/13/2017, 07/11/2017  . Hep A / Hep B 06/09/2014, 07/11/2014, 12/12/2014  . Influenza Split 11/04/2011  . Influenza, Seasonal, Injecte, Preservative Fre 01/09/2014  . Influenza,inj,Quad PF,6+ Mos 11/08/2012, 11/02/2014, 09/25/2015, 09/15/2017  . Influenza,inj,quad, With Preservative 09/27/2016  . Pneumococcal Conjugate-13 04/29/2013  . Pneumococcal Polysaccharide-23 06/14/2014, 06/13/2017  . Tdap  09/03/2012  . Zoster 06/14/2014, 06/13/2017  . Zoster Recombinat (Shingrix) 07/11/2017    Past Medical History:  Diagnosis Date  . Anemia   . Cataract   . Chronic constipation   . Diverticulosis of colon (without mention of hemorrhage)    constipation - new problem 3/12 and work up in progress...  . DJD (degenerative joint disease)   . DVT (deep venous thrombosis) (HCC) 1990s   6 mo coumadin  . GERD (gastroesophageal reflux disease)   . Hypertension   . Hypothyroidism   . Lower leg DVT (deep venous thromboembolism), acute, left ([place]) 06/15/2016  . Lumbar back pain   . Multiple pulmonary nodules 2006  . OSA on CPAP     Tobacco History: Social History   Tobacco Use  Smoking Status Never Smoker  Smokeless Tobacco Never Used   Counseling given: Not Answered   Outpatient Medications Prior to Visit  Medication Sig Dispense Refill  . acetaminophen (TYLENOL) 500 MG tablet Take 500 mg by mouth every 6 (six) hours as needed.    Marland Kitchen amLODipine (NORVASC) 10 MG tablet Take 1 tablet (10 mg total) by mouth daily. 90 tablet 3  . apixaban (ELIQUIS) 5 MG TABS tablet Take 1 tablet (5 mg total) by mouth 2 (two) times daily. 180 tablet 3  . benzonatate (TESSALON) 200 MG capsule Take 1 capsule (200 mg total) by mouth 3 (three) times daily as needed for cough. 30 capsule 1  . Desoximetasone 1.82 % GEL 1 application daily.    . furosemide (LASIX) 40 MG tablet Take 1 tablet (40 mg total) by mouth daily. 90 tablet 3  . levothyroxine (SYNTHROID, LEVOTHROID) 50 MCG tablet TAKE 1 TABLET BY MOUTH EVERY DAY; EXCEPT 2 TABLETS DAILY ON TUESDAY AND FRIDAY 120 tablet 3  . losartan (COZAAR) 100 MG tablet Take 1 tablet (100 mg total) by mouth daily. 90 tablet 3  . loteprednol (LOTEMAX) 0.5 % ophthalmic suspension Place 1 drop into both eyes 2 times daily.    . polyethylene glycol (MIRALAX / GLYCOLAX) packet Take 17 g by mouth as needed.      No facility-administered medications prior to visit.      Review  of Systems  Constitutional:   No  weight loss, night sweats,   +Fevers, chills, fatigue, or  lassitude.  HEENT:   No headaches,  Difficulty swallowing,  Tooth/dental problems, or  Sore throat,                No sneezing, itching, ear ache, + nasal congestion, post nasal drip,   CV:  No chest pain,  Orthopnea, PND, swelling in lower extremities, anasarca, dizziness, palpitations, syncope.   GI  No heartburn, indigestion, abdominal pain, nausea, vomiting, diarrhea, change in bowel habits, loss of appetite, bloody stools.   Resp: No chest wall deformity  Skin: no rash or lesions.  GU: no dysuria, change in color of urine, no urgency or frequency.  No flank pain, no hematuria   MS:  No joint pain or swelling.  No decreased range of motion.  No back pain.    Physical Exam  BP Marland Kitchen)  112/58 (BP Location: Left Arm, Cuff Size: Normal)   Pulse 72   Temp 99.3 F (37.4 C) (Oral)   Ht _0  (1.803 m)   Wt 211 lb (95.7 kg)   SpO2 93%   BMI 29.43 kg/m   GEN: A/Ox3; pleasant , NAD, Justin Hester, chronically ill-appearing   HEENT:  [place]/AT,  EACs-clear, TMs-wnl, NOSE-clear drainage, THROAT-clear, no lesions, no postnasal drip or exudate noted.   NECK:  Supple w/ fair ROM; no JVD; normal carotid impulses w/o bruits; no thyromegaly or nodules palpated; no lymphadenopathy.    RESP bibasilar crackles ,  no accessory muscle use, no dullness to percussion  CARD:  RRR, no m/r/g, tr  peripheral edema, pulses intact, no cyanosis or clubbing.  GI:   Soft & nt; nml bowel sounds; no organomegaly or masses detected.   Musco: Warm bil, no deformities or joint swelling noted.   Neuro: alert, no focal deficits noted.    Skin: Warm, no lesions or rashes    Lab Results:  CBC    Component Value Date/Time   WBC 6.3 09/10/2017 1857   RBC 4.37 09/10/2017 1857   HGB 13.6 09/10/2017 1857   HCT 42.2 09/10/2017 1857   PLT 226 09/10/2017 1857   MCV 96.6 09/10/2017 1857   MCH 31.1 09/10/2017 1857    MCHC 32.2 09/10/2017 1857   RDW 12.9 09/10/2017 1857   LYMPHSABS 2.7 09/10/2017 1857   MONOABS 0.8 09/10/2017 1857   EOSABS 0.2 09/10/2017 1857   BASOSABS 0.1 09/10/2017 1857    BMET    Component Value Date/Time   NA 139 09/10/2017 1857   K 4.6 09/10/2017 1857   CL 107 09/10/2017 1857   CO2 25 09/10/2017 1857   GLUCOSE 91 09/10/2017 1857   BUN 16 09/10/2017 1857   CREATININE 1.22 09/10/2017 1857   CALCIUM 8.8 (L) 09/10/2017 1857   GFRNONAA 53 (L) 09/10/2017 1857   GFRAA >60 09/10/2017 1857    BNP No results found for: BNP  ProBNP No results found for: PROBNP  Imaging: No results found.    PFT Results Latest Ref Rng & Units 12/18/2017  DLCO UNC% % 42  DLCO COR %Predicted % 75  TLC L 4.18  TLC % Predicted % 57  RV % Predicted % 43    No results found for: NITRICOXIDE      Assessment & Plan:   Interstitial lung disease ([place]) Appears to be having acute flare ? Etiology Possible  Viral illness, pneumonia., etc .   Patient has new onset hypoxemia with O2 saturations at 80%.  He has significant symptom burden with shortness of breath.  With new onset hypoxemia will need oxygen and further evaluation.  Would like patient to be admitted to the hospital unfortunately there are no available direct admit beds at either local hospital.  Patient will be directed to the emergency room for further evaluation treatment and probable hospitalization xopenex neb given in office .  Case discussed with Dr. Claudia Desanctis .  Pt to go to ER via private vehicle per patient request with wife    Plan  Go to ER for admission .       Rexene Edison, NP 01/02/2018

## 2017-12-29 NOTE — ED Notes (Signed)
ED TO INPATIENT HANDOFF REPORT  Name/Age/Gender Justin Hester 84 y.o. male  Code Status   Home/SNF/Other Home  Chief Complaint low O2  Level of Care/Admitting Diagnosis ED Disposition    ED Disposition Condition [facility] Area: [place] [100102]  Level of Care: Med-Surg [16]  Diagnosis: CAP (community acquired pneumonia) [387564]  Admitting Physician: Bethena Roys [phone]  Attending Physician: Bethena Roys [phone]  Estimated length of stay: past midnight tomorrow  Certification:: I certify this patient will need inpatient services for at least 2 midnights  PT Class (Do Not Modify): Inpatient [101]  PT Acc Code (Do Not Modify): Private [1]       Medical History Past Medical History:  Diagnosis Date  . Anemia   . Cataract   . Chronic constipation   . Diverticulosis of colon (without mention of hemorrhage)    constipation - new problem 3/12 and work up in progress...  . DJD (degenerative joint disease)   . DVT (deep venous thrombosis) (HCC) 1990s   6 mo coumadin  . GERD (gastroesophageal reflux disease)   . Hypertension   . Hypothyroidism   . Lower leg DVT (deep venous thromboembolism), acute, left ([place]) 06/15/2016  . Lumbar back pain   . Multiple pulmonary nodules 2006  . OSA on CPAP     Allergies Allergies  Allergen Reactions  . Cyclobenzaprine     Rash, chills, shaking  . Oxycodone-Acetaminophen     REACTION: unspecified  . Propoxyphene N-Acetaminophen     REACTION: itching---hot and cold flashes    IV Location/Drains/Wounds Patient Lines/Drains/Airways Status   Active Line/Drains/Airways    Name:   Placement date:   Placement time:   Site:   Days:   Peripheral IV 01/18/2018 Left Antecubital   01/10/2018    1822    Antecubital   less than 1          Labs/Imaging Results for orders placed or performed during the hospital encounter of 12/28/2017 (from the past 48 hour(s))  Comprehensive metabolic  panel     Status: Abnormal   Collection Time: 01/05/2018  6:05 PM  Result Value Ref Range   Sodium 139 135 - 145 mmol/L   Potassium 4.3 3.5 - 5.1 mmol/L   Chloride 105 98 - 111 mmol/L   CO2 23 22 - 32 mmol/L   Glucose, Bld 93 70 - 99 mg/dL   BUN 17 8 - 23 mg/dL   Creatinine, Ser 1.34 (H) 0.61 - 1.24 mg/dL   Calcium 8.5 (L) 8.9 - 10.3 mg/dL   Total Protein 8.0 6.5 - 8.1 g/dL   Albumin 3.3 (L) 3.5 - 5.0 g/dL   AST 20 15 - 41 U/L   ALT 24 0 - 44 U/L   Alkaline Phosphatase 53 38 - 126 U/L   Total Bilirubin 1.1 0.3 - 1.2 mg/dL   GFR calc non Af Amer 48 (L) >60 mL/min   GFR calc Af Amer 56 (L) >60 mL/min   Anion gap 11 5 - 15    Comment: Performed at [facility], [place] [address], [place], [place] [address]  CBC WITH DIFFERENTIAL     Status: Abnormal   Collection Time: 01/17/2018  6:05 PM  Result Value Ref Range   WBC 9.9 4.0 - 10.5 K/uL   RBC 4.17 (L) 4.22 - 5.81 MIL/uL   Hemoglobin 12.9 (L) 13.0 - 17.0 g/dL   HCT 40.1 39.0 - 52.0 %   MCV  96.2 80.0 - 100.0 fL   MCH 30.9 26.0 - 34.0 pg   MCHC 32.2 30.0 - 36.0 g/dL   RDW 12.6 11.5 - 15.5 %   Platelets 305 150 - 400 K/uL   nRBC 0.0 0.0 - 0.2 %   Neutrophils Relative % 65 %   Neutro Abs 6.4 1.7 - 7.7 K/uL   Lymphocytes Relative 20 %   Lymphs Abs 2.0 0.7 - 4.0 K/uL   Monocytes Relative 13 %   Monocytes Absolute 1.3 (H) 0.1 - 1.0 K/uL   Eosinophils Relative 2 %   Eosinophils Absolute 0.2 0.0 - 0.5 K/uL   Basophils Relative 0 %   Basophils Absolute 0.0 0.0 - 0.1 K/uL   Immature Granulocytes 0 %   Abs Immature Granulocytes 0.04 0.00 - 0.07 K/uL    Comment: Performed at [facility], [place] [address] [place], [place] [address]  Urinalysis, Routine w reflex microscopic     Status: Abnormal   Collection Time: 01/18/2018  6:05 PM  Result Value Ref Range   Color, Urine YELLOW YELLOW   APPearance CLEAR CLEAR   Specific Gravity, Urine 1.020 1.005 - 1.030   pH 5.0 5.0 - 8.0   Glucose, UA NEGATIVE  NEGATIVE mg/dL   Hgb urine dipstick SMALL (A) NEGATIVE   Bilirubin Urine NEGATIVE NEGATIVE   Ketones, ur NEGATIVE NEGATIVE mg/dL   Protein, ur NEGATIVE NEGATIVE mg/dL   Nitrite NEGATIVE NEGATIVE   Leukocytes, UA NEGATIVE NEGATIVE   RBC / HPF 0-5 0 - 5 RBC/hpf   WBC, UA 0-5 0 - 5 WBC/hpf   Bacteria, UA NONE SEEN NONE SEEN   Mucus PRESENT     Comment: Performed at [facility], [place] [address] [place], [place] [address]  CG4 I-STAT (Lactic acid)     Status: None   Collection Time: 12/28/2017  6:24 PM  Result Value Ref Range   Lactic Acid, Venous 1.28 0.5 - 1.9 mmol/L   Dg Chest 2 View  Result Date: 01/09/2018 CLINICAL DATA:  Shortness of breath EXAM: CHEST - 2 VIEW COMPARISON:  10/03/2017 FINDINGS: Cardiac shadow is mildly enlarged. Diffuse fibrotic changes are again identified throughout both lungs similar to that seen on prior CT examination. There are however patchy infiltrative opacities in both lungs slightly worse on the right than the left particularly in the upper and lower lobes. Similar changes in the left base are noted. No sizable effusion is noted. No bony abnormality is seen. IMPRESSION: Acute on chronic infiltrates in both lungs slightly worse on the right than the left. Electronically Signed   By: Inez Catalina M.D.   On: 01/07/2018 17:37    Pending Labs Unresulted Labs (From admission, onward)    Start     Ordered   12/27/2017 1805  Blood Culture (routine x 2)  BLOOD CULTURE X 2,   STAT     12/25/2017 1806   Signed and Held  Basic metabolic panel  Tomorrow morning,   R     Signed and Held   Signed and Held  CBC  Tomorrow morning,   R     Signed and Held          Vitals/Pain Today's Vitals   01/02/2018 1959 01/03/2018 1959 12/21/2017 2004 01/11/2018 2035  BP:   (!) 127/49 125/62  Pulse:   98 (!) 102  Resp:   (!) 24 (!) 21  Temp:  (!) 101 F (38.3 C)    TempSrc:  Rectal  SpO2:   92% 95%  PainSc: 0-No pain       Isolation Precautions No active  isolations  Medications Medications  albuterol (PROVENTIL) (2.5 MG/3ML) 0.083% nebulizer solution 5 mg (has no administration in time range)  azithromycin (ZITHROMAX) 500 mg in sodium chloride 0.9 % 250 mL IVPB (500 mg Intravenous New Bag/Given 01/01/2018 1957)  cefTRIAXone (ROCEPHIN) 1 g in sodium chloride 0.9 % 100 mL IVPB (0 g Intravenous Stopped 01/19/2018 1950)  acetaminophen (TYLENOL) tablet 1,000 mg (1,000 mg Oral Given 01/19/2018 1905)    Mobility walks

## 2017-12-29 NOTE — ED Notes (Signed)
ED Provider at bedside. 

## 2017-12-29 NOTE — Progress Notes (Signed)
Reviewed and agree with assessment/plan.   Chesley Mires, MD [facility] Pulmonary/Critical Care 01/16/2016, 12:24 PM Pager:  [phone]

## 2017-12-29 NOTE — ED Triage Notes (Signed)
Pt arrives with c/o shortness of breath. Pt was sent POV from [place] for SOB and O2 sats in the 80's.

## 2017-12-30 ENCOUNTER — Other Ambulatory Visit: Payer: Self-pay

## 2017-12-30 DIAGNOSIS — A419 Sepsis, unspecified organism: Principal | ICD-10-CM

## 2017-12-30 DIAGNOSIS — J9601 Acute respiratory failure with hypoxia: Secondary | ICD-10-CM

## 2017-12-30 DIAGNOSIS — J189 Pneumonia, unspecified organism: Secondary | ICD-10-CM

## 2017-12-30 LAB — CBC
HCT: 36.6 % — ABNORMAL LOW (ref 39.0–52.0)
Hemoglobin: 11.7 g/dL — ABNORMAL LOW (ref 13.0–17.0)
MCH: 30.9 pg (ref 26.0–34.0)
MCHC: 32 g/dL (ref 30.0–36.0)
MCV: 96.6 fL (ref 80.0–100.0)
NRBC: 0 % (ref 0.0–0.2)
Platelets: 249 10*3/uL (ref 150–400)
RBC: 3.79 MIL/uL — ABNORMAL LOW (ref 4.22–5.81)
RDW: 12.6 % (ref 11.5–15.5)
WBC: 10.8 10*3/uL — ABNORMAL HIGH (ref 4.0–10.5)

## 2017-12-30 LAB — RESPIRATORY PANEL BY PCR
Adenovirus: NOT DETECTED
Bordetella pertussis: NOT DETECTED
Chlamydophila pneumoniae: NOT DETECTED
Coronavirus 229E: NOT DETECTED
Coronavirus HKU1: NOT DETECTED
Coronavirus NL63: NOT DETECTED
Coronavirus OC43: NOT DETECTED
INFLUENZA B-RVPPCR: NOT DETECTED
Influenza A: NOT DETECTED
Metapneumovirus: NOT DETECTED
Mycoplasma pneumoniae: NOT DETECTED
PARAINFLUENZA VIRUS 3-RVPPCR: NOT DETECTED
Parainfluenza Virus 1: NOT DETECTED
Parainfluenza Virus 2: NOT DETECTED
Parainfluenza Virus 4: NOT DETECTED
Respiratory Syncytial Virus: NOT DETECTED
Rhinovirus / Enterovirus: NOT DETECTED

## 2017-12-30 LAB — BASIC METABOLIC PANEL
Anion gap: 9 (ref 5–15)
BUN: 15 mg/dL (ref 8–23)
CALCIUM: 7.9 mg/dL — AB (ref 8.9–10.3)
CO2: 21 mmol/L — AB (ref 22–32)
Chloride: 109 mmol/L (ref 98–111)
Creatinine, Ser: 1.1 mg/dL (ref 0.61–1.24)
GFR calc Af Amer: >60 mL/min (ref >60)
GFR calc non Af Amer: >60 mL/min (ref >60)
Glucose, Bld: 100 mg/dL — ABNORMAL HIGH (ref 70–99)
POTASSIUM: 4.1 mmol/L (ref 3.5–5.1)
Sodium: 139 mmol/L (ref 135–145)

## 2017-12-30 LAB — INFLUENZA PANEL BY PCR (TYPE A & B)
Influenza A By PCR: NEGATIVE
Influenza B By PCR: NEGATIVE

## 2017-12-30 LAB — STREP PNEUMONIAE URINARY ANTIGEN: Strep Pneumo Urinary Antigen: NEGATIVE

## 2017-12-30 LAB — MRSA PCR SCREENING: MRSA by PCR: NEGATIVE

## 2017-12-30 MED ORDER — OSELTAMIVIR PHOSPHATE 75 MG PO CAPS
75.0000 mg | ORAL_CAPSULE | Freq: Two times a day (BID) | ORAL | Status: DC
  Administered 2017-12-30: 75 mg via ORAL
  Filled 2017-12-30 (×2): qty 1

## 2017-12-30 MED ORDER — IPRATROPIUM-ALBUTEROL 0.5-2.5 (3) MG/3ML IN SOLN
3.0000 mL | Freq: Four times a day (QID) | RESPIRATORY_TRACT | Status: DC | PRN
  Administered 2017-12-30: 3 mL via RESPIRATORY_TRACT
  Filled 2017-12-30: qty 3

## 2017-12-30 MED ORDER — DM-GUAIFENESIN ER 30-600 MG PO TB12
1.0000 | ORAL_TABLET | Freq: Two times a day (BID) | ORAL | Status: DC
  Administered 2017-12-30 – 2018-01-01 (×7): 1 via ORAL
  Filled 2017-12-30 (×7): qty 1

## 2017-12-30 MED ORDER — ONDANSETRON HCL 4 MG/2ML IJ SOLN
4.0000 mg | Freq: Four times a day (QID) | INTRAMUSCULAR | Status: DC | PRN
  Administered 2018-01-02: 4 mg via INTRAVENOUS
  Filled 2017-12-30: qty 2

## 2017-12-30 MED ORDER — CYCLOSPORINE 0.05 % OP EMUL
1.0000 [drp] | Freq: Two times a day (BID) | OPHTHALMIC | Status: DC
  Administered 2017-12-30 – 2018-01-07 (×18): 1 [drp] via OPHTHALMIC
  Filled 2017-12-30 (×20): qty 1

## 2017-12-30 MED ORDER — ACETAMINOPHEN 650 MG RE SUPP: 650.0000 mg | Freq: Four times a day (QID) | RECTAL | Status: DC | PRN

## 2017-12-30 MED ORDER — SODIUM CHLORIDE 0.9 % IV SOLN
500.0000 mg | INTRAVENOUS | Status: DC
  Administered 2017-12-30 – 2018-01-03 (×5): 500 mg via INTRAVENOUS
  Filled 2017-12-30 (×5): qty 500

## 2017-12-30 MED ORDER — SODIUM CHLORIDE 0.9 % IV SOLN
INTRAVENOUS | Status: AC
  Administered 2017-12-30 (×2): via INTRAVENOUS

## 2017-12-30 MED ORDER — CHLORHEXIDINE GLUCONATE 0.12 % MT SOLN
15.0000 mL | Freq: Two times a day (BID) | OROMUCOSAL | Status: DC
  Administered 2017-12-31 – 2018-01-04 (×9): 15 mL via OROMUCOSAL
  Filled 2017-12-30 (×8): qty 15

## 2017-12-30 MED ORDER — ORAL CARE MOUTH RINSE
15.0000 mL | Freq: Two times a day (BID) | OROMUCOSAL | Status: DC
  Administered 2018-01-02 (×2): 15 mL via OROMUCOSAL

## 2017-12-30 MED ORDER — ACETAMINOPHEN 325 MG PO TABS
650.0000 mg | ORAL_TABLET | Freq: Four times a day (QID) | ORAL | Status: DC | PRN
  Administered 2017-12-30 – 2017-12-31 (×3): 650 mg via ORAL
  Filled 2017-12-30 (×3): qty 2

## 2017-12-30 MED ORDER — APIXABAN 5 MG PO TABS
5.0000 mg | ORAL_TABLET | Freq: Two times a day (BID) | ORAL | Status: DC
  Administered 2017-12-30 – 2018-01-01 (×6): 5 mg via ORAL
  Filled 2017-12-30 (×7): qty 1

## 2017-12-30 MED ORDER — FUROSEMIDE 10 MG/ML IJ SOLN
40.0000 mg | Freq: Four times a day (QID) | INTRAMUSCULAR | Status: AC
  Administered 2017-12-30 (×2): 40 mg via INTRAVENOUS
  Filled 2017-12-30 (×2): qty 4

## 2017-12-30 MED ORDER — IPRATROPIUM-ALBUTEROL 0.5-2.5 (3) MG/3ML IN SOLN
3.0000 mL | Freq: Three times a day (TID) | RESPIRATORY_TRACT | Status: DC
  Administered 2017-12-30 – 2018-01-07 (×27): 3 mL via RESPIRATORY_TRACT
  Filled 2017-12-30 (×28): qty 3

## 2017-12-30 MED ORDER — METHYLPREDNISOLONE SODIUM SUCC 40 MG IJ SOLR
40.0000 mg | Freq: Two times a day (BID) | INTRAMUSCULAR | Status: DC
  Administered 2017-12-30 – 2018-01-03 (×8): 40 mg via INTRAVENOUS
  Filled 2017-12-30 (×6): qty 1

## 2017-12-30 MED ORDER — SODIUM CHLORIDE 0.9 % IV SOLN: INTRAVENOUS | Status: AC

## 2017-12-30 MED ORDER — ONDANSETRON HCL 4 MG PO TABS: 4.0000 mg | ORAL_TABLET | Freq: Four times a day (QID) | ORAL | Status: DC | PRN

## 2017-12-30 MED ORDER — LEVOTHYROXINE SODIUM 50 MCG PO TABS
50.0000 ug | ORAL_TABLET | Freq: Every day | ORAL | Status: DC
  Administered 2017-12-30 – 2018-01-01 (×3): 50 ug via ORAL
  Filled 2017-12-30 (×3): qty 1

## 2017-12-30 MED ORDER — POLYETHYLENE GLYCOL 3350 17 G PO PACK: 17.0000 g | PACK | Freq: Every day | ORAL | Status: DC | PRN

## 2017-12-30 MED ORDER — SODIUM CHLORIDE 0.9 % IV SOLN
2.0000 g | INTRAVENOUS | Status: AC
  Administered 2017-12-30 – 2018-01-05 (×8): 2 g via INTRAVENOUS
  Filled 2017-12-30 (×6): qty 2
  Filled 2017-12-30 (×3): qty 20

## 2017-12-30 NOTE — Progress Notes (Signed)
PROGRESS NOTE   Najeeb Uptain  JSE:[id number]    DOB: [birth year]    DOA: 01/01/2018  PCP: Ria Bush, MD   I have briefly reviewed patients previous medical records in [facility].  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  He was recently found to have interstitial lung disease with probable UIP on CT chest September 2019.  He was admitted for acute respiratory failure with hypoxia due to community-acquired pneumonia complicating newly diagnosed ILD.  Assessment & Plan:   Active Problems:   CAP (community acquired pneumonia)   Suspected community-acquired pneumonia: On admission, patient had fever of 101.2, cough, dyspnea, tachypnea and hypoxia.  Chest x-ray showed right greater than left acute on chronic infiltrates.  Started IV ceftriaxone and azithromycin empirically.  If continues to have fever, may have to broaden antimicrobials.  Flu panel PCR negative.  Blood cultures x2: Pending.  Check urine Legionella and pneumococcal antigen.  Sepsis due to pneumonia: Met sepsis criteria on admission.  Antimicrobial management as above.  Continue IV fluids.  Acute respiratory failure with hypoxia: Due to pneumonia complicating underlying ILD and OSA.  Discussed with pulmonology.  Newly diagnosed ILD: Since have fairly clear suspicion of pneumonia and no clinical bronchospasm, hold off on IV Solu-Medrol for now.  OSA: Continue CPAP.  Recurrent DVT/Antithrombin III deficiency: On lifelong anticoagulation with Eliquis.  Low index of suspicion for recurrent PE.  Patient compliant with Eliquis.  Essential hypertension: Antihypertensives temporarily held due to soft  blood pressures on admission.  Hypothyroid: Continue Synthroid.  CAD: No anginal type of chest pain reported.  Anemia: May be dilutional.  Follow CBCs.  Acute kidney injury: Resolved.   DVT prophylaxis: Eliquis Code Status: Full Family Communication: Discussed in detail with patient's spouse, updated care and answered questions. Disposition: DC home pending clinical improvement.   Consultants:  None  Procedures:  None  Antimicrobials:  IV ceftriaxone and azithromycin.   Subjective: Ongoing chills, fevers, paroxysmal dry cough on deep inspiration but no chest pain.  ROS: As above, otherwise negative.  Objective:  Vitals:   12/30/17 1059 12/30/17 1408 12/30/17 1418 12/30/17 1420  BP:  135/73    Pulse: (!) 106 100    Resp:  20    Temp:  (!) 100.9 F (38.3 C)    TempSrc:  Oral    SpO2: 93% (!) 87% (!) 83% 90%  Weight:      Height:        Examination:  General exam: Pleasant elderly male, moderately built and nourished, slightly ill looking lying comfortably propped up in bed without distress. Respiratory system: Reduced breath sounds in the bases with bibasilar coarse crackles, no wheezing or rhonchi.  Rest of lung fields clear to auscultation. Respiratory effort normal.  Able to speak in full sentences. Cardiovascular system: S1 & S2 heard, RRR. No JVD, murmurs, rubs, gallops or clicks. No pedal edema.  Telemetry personally reviewed: Sinus rhythm. Gastrointestinal system: Abdomen is nondistended, soft and nontender. No organomegaly or masses felt. Normal bowel sounds heard. Central nervous system: Alert and oriented. No focal neurological deficits. Extremities: Symmetric 5 x 5 power. Skin: No rashes, lesions or ulcers Psychiatry: Judgement and insight appear normal. Mood & affect appropriate.  Data Reviewed: I have personally reviewed following labs and imaging studies  CBC: Recent Labs  Lab 12/28/2017 1805 12/30/17 0544  WBC 9.9 10.8*  NEUTROABS 6.4   --   HGB 12.9* 11.7*  HCT 40.1 36.6*  MCV 96.2 96.6  PLT 305 193   Basic Metabolic Panel: Recent Labs  Lab 12/27/2017 1805 12/30/17 0544  NA 139 139  K 4.3 4.1  CL 105 109  CO2 23 21*  GLUCOSE 93 100*  BUN 17 15  CREATININE 1.34* 1.10  CALCIUM 8.5* 7.9*   Liver Function Tests: Recent Labs  Lab 01/03/2018 1805  AST 20  ALT 24  ALKPHOS 53  BILITOT 1.1  PROT 8.0  ALBUMIN 3.3*       Radiology Studies: Dg Chest 2 View  Result Date: 12/20/2017 CLINICAL DATA:  Shortness of breath EXAM: CHEST - 2 VIEW COMPARISON:  10/03/2017 FINDINGS: Cardiac shadow is mildly enlarged. Diffuse fibrotic changes are again identified throughout both lungs similar to that seen on prior CT examination. There are however patchy infiltrative opacities in both lungs slightly worse on the right than the left particularly in the upper and lower lobes. Similar changes in the left base are noted. No sizable effusion is noted. No bony abnormality is seen. IMPRESSION: Acute on chronic infiltrates in both lungs slightly worse on the right than the left. Electronically Signed   By: Inez Catalina M.D.   On: 01/03/2018 17:37        Scheduled Meds: . apixaban  5 mg Oral BID  . cycloSPORINE  1 drop Both Eyes BID  . dextromethorphan-guaiFENesin  1 tablet Oral BID  . ipratropium-albuterol  3 mL Nebulization TID  . levothyroxine  50 mcg Oral Q0600   Continuous Infusions: . azithromycin    . cefTRIAXone (ROCEPHIN)  IV       LOS: 1 day     Vernell Leep, MD, FACP, [facility]. [facility] Pager [phone]  If 7PM-7AM, please contact night-coverage [email or web address] Password [email or web address] 12/30/2017, 4:05 PM

## 2017-12-30 NOTE — Progress Notes (Addendum)
eLink Physician-Brief Progress Note Patient Name: Justin Hester DOB: 09/15/1930 MRN: [id number]   Date of Service  12/30/2017  HPI/Events of Note  Pt was transferred to the ICU from the floors, with increased O2 requirements. Pt being treated for PNA. Pt on venti mask, appears tachypneic but he is able to speak.  He appears comfortable.  eICU Interventions  Trial on BIPAP     Intervention Category Intermediate Interventions: Respiratory distress - evaluation and management   4:04 AM Follow up video assessment revealed the patient comfortable on BIPAP.   Elsie Lincoln 12/30/2017, 8:01 PM

## 2017-12-30 NOTE — Consult Note (Signed)
PULMONARY / CRITICAL CARE MEDICINE   NAME:  Justin Hester, MRN:  [id number], DOB:  Oct 27, 1930, LOS: 1 ADMISSION DATE:  01/09/2018, CONSULTATION DATE:  12/30/2017 REFERRING MD:  Algis Liming, CHIEF COMPLAINT:  Dyspnea  BRIEF HISTORY:    84 year old male with obstructive sleep apnea and ILD admitted on December 29, 2017 with shortness of breath.  Found to have evidence of community-acquired pneumonia. HISTORY OF PRESENT ILLNESS   This is an 84 year old male who has been followed by Dr. Halford Chessman recently for evaluation of an abnormal CT scan.  He was seen in the pulmonary clinic on November 18, 2017 because a CT scan was performed in the setting of a recent DVT which showed interstitial lung disease in a pattern worrisome for UIP.  At that time during his visit he noted some cough occasionally and some reflux symptoms.  He reported some shortness of breath while working on an exercise bike but remains quite active with biking 12 miles per day.  At that visit lung function testing was arranged as well as blood testing to look for evidence of an underlying connective tissue disease.  He tells me that his illness started about 2 months ago and he has steadily felt worse since then.  He was initially diagnosed with a DVT and was then started on Eliquis.  This led to a CT scan which showed evidence of scarring in his lungs.  He says that in the last week or 2 he has had steadily worsening shortness of breath, dry cough, a tightness under his diaphragms.  He periodically feels nauseated but he does not have the need to vomit.  He has not had fevers or chills.  He works in a university environment but he is not aware of any direct sick contacts.  No trouble swallowing.  His appetite has been reasonable but in the last few days he is mostly consume liquids though he did have breakfast this morning.  He has never smoked cigarettes.  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  He says that he never had breathing difficulty as an adult and has remained very physically active over the years.  He has no family members with lung disease.  SIGNIFICANT PAST MEDICAL HISTORY   Obstructive sleep apnea, pulmonary nodules, DVT, gastroesophageal reflux disease, diabetes mellitus.  SIGNIFICANT EVENTS:  12/10 admission 12/11 move to ICU for close observation  STUDIES:   October 03, 2017 CT angiogram chest showed peripheral interlobular septal thickening, images somewhat blurry but there does appear to be peripheral honeycombing worse in the bases, heterogeneous changes with areas of lung spared, images independently reviewed no pulmonary embolism.  Radiology notes the findings are probable UIP October 30 blood work ANA negative, rheumatoid factor negative November 2019 lung function testing showed total lung capacity of 4.18 L 57% predicted DLCO 14.4 mL 42% predicted December 29, 2017 chest x-ray images independently reviewed showing bilateral airspace disease, some pleural effusion question cephalization  CULTURES:  01/03/2018 blood >> 12/11 RSV >  12/11 Flu >   ANTIBIOTICS:  12/10 ceftriaxone  12/10 azithro   LINES/TUBES:    CONSULTANTS:  Pulmonary medicine  SUBJECTIVE:  As above  CONSTITUTIONAL: BP 135/73   Pulse 100   Temp (!) 101.8 F (38.8 C) (Rectal)   Resp 20   Ht _0  (1.803 m)   Wt 93.4 kg   SpO2 90%   BMI 28.72 kg/m  I/O last 3 completed shifts: In: 302.7 [P.O.:180; I.V.:22.7; IV Piggyback:100] Out: -      PHYSICAL EXAM:  General:  Resting in bed, some accessory muscle use, no paradoxical breathing HENT: NCAT OP clear PULM: Crackles throughout B, no wheezing, speaking in full sentences CV: RRR, loud split P2, mininmal JVD, no mgr GI: BS+, soft, nontender MSK: normal bulk and tone Neuro: awake, alert, no distress, MAEW    RESOLVED PROBLEM LIST   ASSESSMENT AND PLAN    Acute respiratory failure with hypoxemia with multifocal infiltrates: I think the most likely explanation is severe community-acquired pneumonia on top of a background of probable UIP.  The differential diagnosis includes a rapidly progressive UIP such as Haman Rich syndrome, organizing pneumonia, or eosinophilic pneumonia.  Less likely would be a rapidly progressive nonspecific interstitial pneumonitis.  Aspiration pneumonitis should be considered given the recent nausea and dry heaving.  Community-acquired pneumonia Continue ceftriaxone Continue azithromycin Add Solu-Medrol Check BNP Check echocardiogram as physical exam supportive of pulmonary hypertension Check respiratory viral panel Follow-up influenza Add empiric Tamiflu Continue mucinex dm for couth  Diffuse parenchymal lung disease Add on immunology lab work to that which was already collected: Scleroderma antibody, Sjogren's antibody, CCP, aldolase, anti-Jo 1, small vessel vasculitis labs  DVT Continue Eliquis  Hypothyroid Continue synthroid  Acute respiratory failure with hypoxemia: Move to ICU for close monitoring Administer O2 to maintain O2 saturation greater than 90% Lasix x1 dose BNP  OSA CPAP qHS  Mild normocytic anemia without bleeding Monitor for bleeding Transfuse PRBC for Hgb < 7 gm/dL    SUMMARY OF TODAY'S PLAN:  Move to ICU  Best Practice / Goals of Care / Disposition.   DVT PROPHYLAXIS:eliquis SUP:n/a NUTRITION:reg diet MOBILITY:up ad lib GOALS OF CARE:full code FAMILY DISCUSSIONS: updated wife bedside DISPOSITION move to ICU  LABS  Glucose No results for input(s): GLUCAP in the last 168 hours.  BMET Recent Labs  Lab 12/24/2017 1805 12/30/17 0544  NA 139 139  K 4.3 4.1  CL 105 109  CO2 23 21*  BUN 17 15  CREATININE 1.34* 1.10  GLUCOSE 93 100*    Liver Enzymes Recent Labs  Lab 01/15/2018 1805  AST 20  ALT 24  ALKPHOS 53  BILITOT 1.1  ALBUMIN 3.3*     Electrolytes Recent Labs  Lab 12/23/2017 1805 12/30/17 0544  CALCIUM 8.5* 7.9*    CBC Recent Labs  Lab 01/09/2018 1805 12/30/17 0544  WBC 9.9 10.8*  HGB 12.9* 11.7*  HCT 40.1 36.6*  PLT 305 249    ABG No results for input(s): PHART, PCO2ART, PO2ART in the last 168 hours.  Coag's No results for input(s): APTT, INR in the last 168 hours.  Sepsis Markers Recent Labs  Lab 01/09/2018 1824 01/16/2018 2148  LATICACIDVEN 1.28 1.09    Cardiac Enzymes No results for input(s): TROPONINI, PROBNP in the last 168 hours.  PAST MEDICAL HISTORY :   He  has a past medical history of Anemia, Cataract, Chronic constipation, Diverticulosis of colon (without mention of hemorrhage), DJD (degenerative joint disease), DVT (deep venous thrombosis) ([place]) (1990s), GERD (gastroesophageal reflux disease), Hypertension, Hypothyroidism, Lower leg DVT (deep venous thromboembolism), acute, left ([place]) (06/15/2016), Lumbar back pain, Multiple pulmonary nodules (2006), and OSA on CPAP.  PAST SURGICAL HISTORY:  He  has a past surgical history that includes Cataract extraction, bilateral; Total knee arthroplasty (left 9/06, right 10/07); Colonoscopy (2012); and Mouth surgery (03/28/2016).  Allergies  Allergen Reactions  . Cyclobenzaprine  Rash, chills, shaking  . Oxycodone-Acetaminophen     REACTION: unspecified  . Propoxyphene N-Acetaminophen     REACTION: itching---hot and cold flashes    No current facility-administered medications on file prior to encounter.    Current Outpatient Medications on File Prior to Encounter  Medication Sig  . acetaminophen (TYLENOL) 500 MG tablet Take 500 mg by mouth every 6 (six) hours as needed for mild pain.   Marland Kitchen amLODipine (NORVASC) 10 MG tablet Take 1 tablet (10 mg total) by mouth daily.  Marland Kitchen apixaban (ELIQUIS) 5 MG TABS tablet Take 1 tablet (5 mg total) by mouth 2 (two) times daily.  . benzonatate (TESSALON) 200 MG capsule Take 1 capsule (200 mg total) by mouth  3 (three) times daily as needed for cough.  . cycloSPORINE (RESTASIS) 0.05 % ophthalmic emulsion Place 1 drop into both eyes 2 (two) times daily.  . Desoximetasone 0.05 % GEL Apply 1 application topically daily.   . furosemide (LASIX) 40 MG tablet Take 1 tablet (40 mg total) by mouth daily.  Marland Kitchen levothyroxine (SYNTHROID, LEVOTHROID) 50 MCG tablet TAKE 1 TABLET BY MOUTH EVERY DAY; EXCEPT 2 TABLETS DAILY ON TUESDAY AND FRIDAY (Patient taking differently: TAKE 1 TABLET BY MOUTH EVERY DAY; EXCEPT 2 TABLETS DAILY ON Monday AND FRIDAY)  . losartan (COZAAR) 100 MG tablet Take 1 tablet (100 mg total) by mouth daily.  . polyethylene glycol (MIRALAX / GLYCOLAX) packet Take 17 g by mouth as needed.   . [DISCONTINUED] losartan-hydrochlorothiazide (HYZAAR) 100-25 MG per tablet Take 1 tablet by mouth daily.    FAMILY HISTORY:   His family history includes Heart disease in his father; Rheum arthritis in his father and mother. There is no history of Cancer.  SOCIAL HISTORY:  He  reports that he has never smoked. He has never used smokeless tobacco. He reports that he drank alcohol. He reports that he does not use drugs.  REVIEW OF SYSTEMS:    Gen: Denies fever, chills, weight change, fatigue, night sweats HEENT: Denies blurred vision, double vision, hearing loss, tinnitus, sinus congestion, rhinorrhea, sore throat, neck stiffness, dysphagia PULM: Denies shortness of breath, cough, sputum production, hemoptysis, wheezing CV: Denies chest pain, edema, orthopnea, paroxysmal nocturnal dyspnea, palpitations GI: Denies abdominal pain, nausea, vomiting, diarrhea, hematochezia, melena, constipation, change in bowel habits GU: Denies dysuria, hematuria, polyuria, oliguria, urethral discharge Endocrine: Denies hot or cold intolerance, polyuria, polyphagia or appetite change Derm: Denies rash, dry skin, scaling or peeling skin change Heme: Denies easy bruising, bleeding, bleeding gums Neuro: Denies headache, numbness,  weakness, slurred speech, loss of memory or consciousness  Roselie Awkward, MD [facility] Pager: [phone] Cell: [phone] If no response, call [phone]

## 2017-12-31 ENCOUNTER — Inpatient Hospital Stay (HOSPITAL_COMMUNITY): Payer: Medicare Other

## 2017-12-31 LAB — LEGIONELLA PNEUMOPHILA SEROGP 1 UR AG: L. pneumophila Serogp 1 Ur Ag: NEGATIVE

## 2017-12-31 LAB — ANCA TITERS
Atypical P-ANCA titer: <1:20 {titer}
C-ANCA: <1:20 {titer}
P-ANCA: <1:20 {titer}

## 2017-12-31 LAB — MPO/PR-3 (ANCA) ANTIBODIES
ANCA Proteinase 3: <3.5 U/mL (ref 0.0–3.5)
Myeloperoxidase Abs: <9 U/mL (ref 0.0–9.0)

## 2017-12-31 LAB — CBC
HCT: 40.2 % (ref 39.0–52.0)
Hemoglobin: 13 g/dL (ref 13.0–17.0)
MCH: 30.7 pg (ref 26.0–34.0)
MCHC: 32.3 g/dL (ref 30.0–36.0)
MCV: 95 fL (ref 80.0–100.0)
Platelets: 225 10*3/uL (ref 150–400)
RBC: 4.23 MIL/uL (ref 4.22–5.81)
RDW: 12.5 % (ref 11.5–15.5)
WBC: 16.1 10*3/uL — ABNORMAL HIGH (ref 4.0–10.5)
nRBC: 0 % (ref 0.0–0.2)

## 2017-12-31 LAB — BASIC METABOLIC PANEL
Anion gap: 12 (ref 5–15)
BUN: 17 mg/dL (ref 8–23)
CO2: 21 mmol/L — ABNORMAL LOW (ref 22–32)
Calcium: 8.5 mg/dL — ABNORMAL LOW (ref 8.9–10.3)
Chloride: 104 mmol/L (ref 98–111)
Creatinine, Ser: 1.15 mg/dL (ref 0.61–1.24)
GFR calc Af Amer: >60 mL/min (ref >60)
GFR calc non Af Amer: 58 mL/min — ABNORMAL LOW (ref >60)
Glucose, Bld: 145 mg/dL — ABNORMAL HIGH (ref 70–99)
POTASSIUM: 4 mmol/L (ref 3.5–5.1)
Sodium: 137 mmol/L (ref 135–145)

## 2017-12-31 LAB — SJOGRENS SYNDROME-A EXTRACTABLE NUCLEAR ANTIBODY: SSA (Ro) (ENA) Antibody, IgG: <0.2 AI (ref 0.0–0.9)

## 2017-12-31 LAB — ANTI-SCLERODERMA ANTIBODY: Scleroderma (Scl-70) (ENA) Antibody, IgG: <0.2 AI (ref 0.0–0.9)

## 2017-12-31 LAB — SJOGRENS SYNDROME-B EXTRACTABLE NUCLEAR ANTIBODY: SSB (La) (ENA) Antibody, IgG: <0.2 AI (ref 0.0–0.9)

## 2017-12-31 LAB — CYCLIC CITRUL PEPTIDE ANTIBODY, IGG/IGA: CCP Antibodies IgG/IgA: 14 units (ref 0–19)

## 2017-12-31 LAB — ALDOLASE: Aldolase: 7.8 U/L (ref 3.3–10.3)

## 2017-12-31 LAB — BRAIN NATRIURETIC PEPTIDE: B Natriuretic Peptide: 218.2 pg/mL — ABNORMAL HIGH (ref 0.0–100.0)

## 2017-12-31 LAB — ANTI-JO 1 ANTIBODY, IGG: Anti JO-1: <0.2 AI (ref 0.0–0.9)

## 2017-12-31 MED ORDER — AMLODIPINE BESYLATE 10 MG PO TABS
10.0000 mg | ORAL_TABLET | Freq: Every day | ORAL | Status: DC
  Administered 2017-12-31 – 2018-01-01 (×2): 10 mg via ORAL
  Filled 2017-12-31 (×2): qty 1

## 2017-12-31 MED ORDER — LOSARTAN POTASSIUM 50 MG PO TABS
100.0000 mg | ORAL_TABLET | Freq: Every day | ORAL | Status: DC
  Administered 2017-12-31 – 2018-01-01 (×2): 100 mg via ORAL
  Filled 2017-12-31 (×3): qty 2

## 2017-12-31 MED ORDER — FUROSEMIDE 10 MG/ML IJ SOLN
40.0000 mg | Freq: Four times a day (QID) | INTRAMUSCULAR | Status: AC
  Administered 2017-12-31 (×2): 40 mg via INTRAVENOUS
  Filled 2017-12-31 (×2): qty 4

## 2017-12-31 MED ORDER — POLYETHYLENE GLYCOL 3350 17 G PO PACK
17.0000 g | PACK | Freq: Every day | ORAL | Status: DC
  Administered 2017-12-31 – 2018-01-01 (×2): 17 g via ORAL
  Filled 2017-12-31 (×2): qty 1

## 2017-12-31 NOTE — Care Management Note (Signed)
Case Management Note  Patient Details  Name: Justin Hester MRN: [id number] Date of Birth: [birth year]  Subjective/Objective:                  84 year old male with obstructive sleep apnea and ILD admitted on December 29, 2017 with shortness of breath.  Found to have evidence of community-acquired pneumonia.  Action/Plan: Will follow for progression of care and clinical status. Will follow for case management needs none present at this time.  Expected Discharge Date:  (unknown)               Expected Discharge Plan:     In-House Referral:     Discharge planning Services     Post Acute Care Choice:    Choice offered to:     DME Arranged:    DME Agency:     HH Arranged:    HH Agency:     Status of Service:     If discussed at [employer], dates discussed:    Additional Comments:  Justin Cha, RN 12/31/2017, 9:05 AM

## 2017-12-31 NOTE — Progress Notes (Signed)
PULMONARY / CRITICAL CARE MEDICINE   NAME:  Justin Hester, MRN:  [id number], DOB:  03-Mar-1930, LOS: 2 ADMISSION DATE:  12/28/2017, CONSULTATION DATE:  12/30/2017 REFERRING MD:  Algis Liming, CHIEF COMPLAINT:  Dyspnea  BRIEF HISTORY:    84 year old male with obstructive sleep apnea and ILD admitted on December 29, 2017 with shortness of breath and acute on chronic respiratory failure with hypoxemia due to evidence of community-acquired pneumonia.   SIGNIFICANT PAST MEDICAL HISTORY   Obstructive sleep apnea, pulmonary nodules, DVT, gastroesophageal reflux disease, diabetes mellitus.  SIGNIFICANT EVENTS:  12/10 admission 12/11 move to ICU for close observation  STUDIES:   October 03, 2017 CT angiogram chest showed peripheral interlobular septal thickening, images somewhat blurry but there does appear to be peripheral honeycombing worse in the bases, heterogeneous changes with areas of lung spared, images independently reviewed no pulmonary embolism.  Radiology notes the findings are probable UIP October 30 blood work ANA negative, rheumatoid factor negative November 2019 lung function testing showed total lung capacity of 4.18 L 57% predicted DLCO 14.4 mL 42% predicted December 29, 2017 chest x-ray images independently reviewed showing bilateral airspace disease, some pleural effusion question cephalization  CULTURES:  01/04/2018 blood >> 12/11 RSV > negative 12/11 Flu > negative 12/10 urine strep >   ANTIBIOTICS:  12/10 ceftriaxone  12/10 azithro  12/10 tamiflu x1 dose  LINES/TUBES:  12/13 BIPAP  CONSULTANTS:  Pulmonary medicine  SUBJECTIVE:  Didn't sleep much Placed on BIPAP overnight  CONSTITUTIONAL: BP (!) 152/56   Pulse (!) 108   Temp 99.8 F (37.7 C) (Axillary)   Resp (!) 22   Ht _0  (1.803 m)   Wt 93.9 kg   SpO2 96%   BMI 28.87 kg/m   I/O last 3 completed shifts: In: 2166.1 [P.O.:380; I.V.:1586; IV Piggyback:200.1] Out: 3100 [Urine:3100]      PHYSICAL EXAM:  General:  Resting comfortably in bed HENT: NCAT BIPAP mask in place PULM: Crackles bases B, normal effort CV: RRR, no mgr GI: BS+, soft, nontender MSK: normal bulk and tone Neuro: awake, alert, no distress, MAEW    RESOLVED PROBLEM LIST   ASSESSMENT AND PLAN   Acute respiratory failure with hypoxemia with multifocal infiltrates: broad ddx, see discussion on 12/11 note > worse this morning > repeat CXR now > try to wean to High Flow O2 > hold diuresis today > continue empiric solumedrol > f/u immunology labs sent last night > discussing code status with patient, wife, suspect he needs mechanical ventilation and bronchoscopy  Community-acquired pneumonia >Continue ceftriaxone >Continue azithromycin >Continue solumedrol for now >F/u echo cardiogram >stop tamiflu   Diffuse parenchymal lung disease: flare? > continue empiric steroids > f/u labs sent yesterday  DVT > continue eliquis  Hypothyroid > synthroid   OSA > CPAP qHS  Mild normocytic anemia without bleeding Monitor for bleeding Transfuse PRBC for Hgb < 7 gm/dL     SUMMARY OF TODAY'S PLAN:  Read above  Best Practice / Goals of Care / Disposition.   DVT PROPHYLAXIS:eliquis SUP:n/a NUTRITION:reg diet MOBILITY:up ad lib GOALS OF CARE:full code FAMILY DISCUSSIONS: updated wife bedside DISPOSITION move to ICU  LABS  Glucose No results for input(s): GLUCAP in the last 168 hours.  BMET Recent Labs  Lab 01/02/2018 1805 12/30/17 0544 12/31/17 0521  NA 139 139 137  K 4.3 4.1 4.0  CL 105 109 104  CO2 23 21* 21*  BUN _1 CREATININE 1.34* 1.10 1.15  GLUCOSE 93 100* 145*  Liver Enzymes Recent Labs  Lab 01/17/2018 1805  AST 20  ALT 24  ALKPHOS 53  BILITOT 1.1  ALBUMIN 3.3*    Electrolytes Recent Labs  Lab 12/27/2017 1805 12/30/17 0544 12/31/17 0521  CALCIUM 8.5* 7.9* 8.5*    CBC Recent Labs  Lab 01/10/2018 1805 12/30/17 0544 12/31/17 0521  WBC 9.9 10.8*  16.1*  HGB 12.9* 11.7* 13.0  HCT 40.1 36.6* 40.2  PLT 305 249 225    ABG No results for input(s): PHART, PCO2ART, PO2ART in the last 168 hours.  Coag's No results for input(s): APTT, INR in the last 168 hours.  Sepsis Markers Recent Labs  Lab 01/11/2018 1824 01/02/2018 2148  LATICACIDVEN 1.28 1.09    Cardiac Enzymes No results for input(s): TROPONINI, PROBNP in the last 168 hours.   My cc time 45 minutes  Roselie Awkward, MD [facility] [facility] Pager: [phone] Cell: [phone] If no response, call [phone]

## 2017-12-31 NOTE — ED Provider Notes (Signed)
[facility]-ICU/STEPDOWN Provider Note   CSN: [id number] Arrival date & time: 01/18/2018  1654     History   Chief Complaint Chief Complaint  Patient presents with  . Shortness of Breath    HPI Justin Hester is a 84 y.o. male.  The history is provided by the patient and medical records.  Shortness of Breath  This is a recurrent problem. The average episode lasts 3 days. The problem occurs continuously.The problem has been gradually worsening. Associated symptoms include coryza, cough and sputum production. Pertinent negatives include no fever, no orthopnea, no chest pain, no syncope and no vomiting. He has tried nothing for the symptoms. Associated medical issues include COPD.    Past Medical History:  Diagnosis Date  . Anemia   . Cataract   . Chronic constipation   . Diverticulosis of colon (without mention of hemorrhage)    constipation - new problem 3/12 and work up in progress...  . DJD (degenerative joint disease)   . DVT (deep venous thrombosis) (HCC) 1990s   6 mo coumadin  . GERD (gastroesophageal reflux disease)   . Hypertension   . Hypothyroidism   . Lower leg DVT (deep venous thromboembolism), acute, left ([place]) 06/15/2016  . Lumbar back pain   . Multiple pulmonary nodules 2006  . OSA on CPAP     Patient Active Problem List   Diagnosis Date Noted  . CAP (community acquired pneumonia) 01/05/2018  . Cough 12/18/2017  . Shortness of breath 10/02/2017  . Left thyroid nodule 09/23/2017  . Bronchiectasis ([place]) 09/23/2017  . Pericardial effusion 09/23/2017  . Interstitial lung disease ([place]) 09/23/2017  . CAD (coronary artery disease) 09/23/2017  . Acute deep vein thrombosis (DVT) of popliteal vein of left lower extremity ([place]) 09/17/2017  . Pancreas cyst 09/17/2017  . Diverticulosis 09/17/2017  . Onychomycosis of toenail 07/10/2017  . 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 Left ear hearing loss 06/14/2015  . Advanced care  planning/counseling discussion 06/09/2014  . Medicare annual wellness visit, subsequent 04/29/2013  . Hypothyroidism 04/27/2012  . Chronic idiopathic constipation 03/25/2010  . VENOUS INSUFFICIENCY 12/06/2008  . OSA with oral appliance (Dr. Ron Parker) 06/15/2008  . HYPERCHOLESTEROLEMIA 09/10/2007  . HERNIA, UMBILICAL 09/40/7680  . DIVERTICULOSIS OF COLON 09/10/2007  . ANXIETY 04/26/2007  . ERECTILE DYSFUNCTION 04/26/2007  . Essential hypertension 04/26/2007  . Pulmonary nodules 04/26/2007  . Osteoarthritis 04/26/2007  . DEEP VENOUS THROMBOPHLEBITIS, HX OF 04/26/2007    Past Surgical History:  Procedure Laterality Date  . CATARACT EXTRACTION, BILATERAL     by Dr. Ricki Miller  . COLONOSCOPY  2012   diverticulosis o/w WNL [name])  . MOUTH SURGERY  03/28/2016   gum surgery  . TOTAL KNEE ARTHROPLASTY  left 9/06, right 10/07   Dr. Sharol Given        Home Medications    Prior to Admission medications   Medication Sig Start Date End Date Taking? Authorizing Provider  acetaminophen (TYLENOL) 500 MG tablet Take 500 mg by mouth every 6 (six) hours as needed for mild pain.    Yes [provider]  amLODipine (NORVASC) 10 MG tablet Take 1 tablet (10 mg total) by mouth daily. 11/26/17  Yes Ria Bush, MD  apixaban (ELIQUIS) 5 MG TABS tablet Take 1 tablet (5 mg total) by mouth 2 (two) times daily. 11/26/17  Yes Ria Bush, MD  benzonatate (TESSALON) 200 MG capsule Take 1 capsule (200 mg total) by mouth 3 (three) times daily as needed for cough. 12/18/17  Yes  Lauraine Rinne, NP  cycloSPORINE (RESTASIS) 0.05 % ophthalmic emulsion Place 1 drop into both eyes 2 (two) times daily.   Yes [provider]  Desoximetasone 0.05 % GEL Apply 1 application topically daily.    Yes [provider]  furosemide (LASIX) 40 MG tablet Take 1 tablet (40 mg total) by mouth daily. 11/26/17  Yes Ria Bush, MD  levothyroxine (SYNTHROID, LEVOTHROID) 50 MCG tablet TAKE 1 TABLET BY  MOUTH EVERY DAY; EXCEPT 2 TABLETS DAILY ON TUESDAY AND FRIDAY Patient taking differently: TAKE 1 TABLET BY MOUTH EVERY DAY; EXCEPT 2 TABLETS DAILY ON Monday AND FRIDAY 11/26/17  Yes Ria Bush, MD  losartan (COZAAR) 100 MG tablet Take 1 tablet (100 mg total) by mouth daily. 11/26/17  Yes Ria Bush, MD  polyethylene glycol [facility] / Floria Raveling) packet Take 17 g by mouth as needed.    Yes [provider]  losartan-hydrochlorothiazide (HYZAAR) 100-25 MG per tablet Take 1 tablet by mouth daily. 03/27/11 04/03/11  Noralee Space, MD    Family History Family History  Problem Relation Age of Onset  . Heart disease Father   . Rheum arthritis Father   . Rheum arthritis Mother   . Cancer Neg Hx     Social History Social History   Tobacco Use  . Smoking status: Never Smoker  . Smokeless tobacco: Never Used  Substance Use Topics  . Alcohol use: Not Currently  . Drug use: No     Allergies   Cyclobenzaprine; Oxycodone-acetaminophen; and Propoxyphene n-acetaminophen   Review of Systems Review of Systems  Constitutional: Negative for fever.  Respiratory: Positive for cough, sputum production and shortness of breath.   Cardiovascular: Negative for chest pain, orthopnea and syncope.  Gastrointestinal: Negative for vomiting.  All other systems reviewed and are negative.    Physical Exam Updated Vital Signs BP (!) 170/87   Pulse (!) 106   Temp 97.8 F (36.6 C) (Axillary)   Resp (!) 29   Ht _0  (1.803 m)   Wt 93.9 kg   SpO2 97%   BMI 28.87 kg/m   Physical Exam Vitals signs and nursing note reviewed.  Constitutional:      Appearance: He is well-developed.  HENT:     Head: Normocephalic and atraumatic.  Eyes:     Pupils: Pupils are equal, round, and reactive to light.  Neck:     Musculoskeletal: Normal range of motion.  Cardiovascular:     Rate and Rhythm: Tachycardia present.  Pulmonary:     Effort: Pulmonary effort is normal. Tachypnea present. No  respiratory distress.     Breath sounds: Decreased breath sounds and wheezing present.  Abdominal:     General: There is no distension.  Musculoskeletal: Normal range of motion.  Skin:    General: Skin is warm and dry.  Neurological:     General: No focal deficit present.     Mental Status: He is alert.      ED Treatments / Results  Labs (all labs ordered are listed, but only abnormal results are displayed) Labs Reviewed  COMPREHENSIVE METABOLIC PANEL - Abnormal; Notable for the following components:      Result Value   Creatinine, Ser 1.34 (*)    Calcium 8.5 (*)    Albumin 3.3 (*)    GFR calc non Af Amer 48 (*)    GFR calc Af Amer 56 (*)    All other components within normal limits  CBC WITH DIFFERENTIAL/PLATELET - Abnormal; Notable for the  following components:   RBC 4.17 (*)    Hemoglobin 12.9 (*)    Monocytes Absolute 1.3 (*)    All other components within normal limits  URINALYSIS, ROUTINE W REFLEX MICROSCOPIC - Abnormal; Notable for the following components:   Hgb urine dipstick SMALL (*)    All other components within normal limits  BASIC METABOLIC PANEL - Abnormal; Notable for the following components:   CO2 21 (*)    Glucose, Bld 100 (*)    Calcium 7.9 (*)    All other components within normal limits  CBC - Abnormal; Notable for the following components:   WBC 10.8 (*)    RBC 3.79 (*)    Hemoglobin 11.7 (*)    HCT 36.6 (*)    All other components within normal limits  RESPIRATORY PANEL BY PCR  MRSA PCR SCREENING  CULTURE, BLOOD (ROUTINE X 2)  CULTURE, BLOOD (ROUTINE X 2)  INFLUENZA PANEL BY PCR (TYPE A & B)  STREP PNEUMONIAE URINARY ANTIGEN  LEGIONELLA PNEUMOPHILA SEROGP 1 UR AG  CBC  BASIC METABOLIC PANEL  BRAIN NATRIURETIC PEPTIDE  ANCA TITERS  MPO/PR-3 (ANCA) ANTIBODIES  ANTI-JO 1 ANTIBODY, IGG  ALDOLASE  ANTI-SCLERODERMA ANTIBODY  SJOGRENS SYNDROME-A EXTRACTABLE NUCLEAR ANTIBODY  SJOGRENS SYNDROME-B EXTRACTABLE NUCLEAR ANTIBODY  CYCLIC CITRUL  PEPTIDE ANTIBODY, IGG/IGA  I-STAT CG4 LACTIC ACID, ED  CG4 I-STAT (LACTIC ACID)  I-STAT CG4 LACTIC ACID, ED    EKG EKG Interpretation  Date/Time:  Tuesday December 29 2017 17:03:32 EST Ventricular Rate:  92 PR Interval:    QRS Duration: 83 QT Interval:  323 QTC Calculation: 400 R Axis:   57 Text Interpretation:  Sinus rhythm Probable left atrial enlargement Baseline wander in lead(s) V6 No significant change since last tracing Confirmed by Merrily Pew [phone]) on 01/14/2018 6:34:55 PM Also confirmed by Merrily Pew [phone]), editor Hattie Perch ([id number])  on 12/30/2017 7:14:26 AM   Radiology Dg Chest 2 View  Result Date: 12/26/2017 CLINICAL DATA:  Shortness of breath EXAM: CHEST - 2 VIEW COMPARISON:  10/03/2017 FINDINGS: Cardiac shadow is mildly enlarged. Diffuse fibrotic changes are again identified throughout both lungs similar to that seen on prior CT examination. There are however patchy infiltrative opacities in both lungs slightly worse on the right than the left particularly in the upper and lower lobes. Similar changes in the left base are noted. No sizable effusion is noted. No bony abnormality is seen. IMPRESSION: Acute on chronic infiltrates in both lungs slightly worse on the right than the left. Electronically Signed   By: Inez Catalina M.D.   On: 01/12/2018 17:37    Procedures Procedures (including critical care time)  CRITICAL CARE Performed by: Merrily Pew Total critical care time: 35 minutes Critical care time was exclusive of separately billable procedures and treating other patients. Critical care was necessary to treat or prevent imminent or life-threatening deterioration. Critical care was time spent personally by me on the following activities: development of treatment plan with patient and/or surrogate as well as nursing, discussions with consultants, evaluation of patient's response to treatment, examination of patient, obtaining history from patient or  surrogate, ordering and performing treatments and interventions, ordering and review of laboratory studies, ordering and review of radiographic studies, pulse oximetry and re-evaluation of patient's condition.   Medications Ordered in ED Medications  ipratropium-albuterol (DUONEB) 0.5-2.5 (3) MG/3ML nebulizer solution 3 mL (3 mLs Nebulization Given 12/30/17 0158)  cefTRIAXone (ROCEPHIN) 2 g in sodium chloride 0.9 % 100 mL IVPB (0 g Intravenous Stopped  12/30/17 2000)  azithromycin (ZITHROMAX) 500 mg in sodium chloride 0.9 % 250 mL IVPB (0 mg Intravenous Stopped 12/30/17 2235)  apixaban (ELIQUIS) tablet 5 mg (5 mg Oral Given 12/30/17 2138)  dextromethorphan-guaiFENesin ([place] DM) 30-600 MG per 12 hr tablet 1 tablet (1 tablet Oral Given 12/30/17 2138)  levothyroxine (SYNTHROID, LEVOTHROID) tablet 50 mcg (50 mcg Oral Given 12/30/17 0556)  acetaminophen (TYLENOL) tablet 650 mg (650 mg Oral Given 12/30/17 1652)    Or  acetaminophen (TYLENOL) suppository 650 mg ( Rectal See Alternative 12/30/17 1652)  ondansetron (ZOFRAN) tablet 4 mg (has no administration in time range)    Or  ondansetron (ZOFRAN) injection 4 mg (has no administration in time range)  polyethylene glycol (MIRALAX / GLYCOLAX) packet 17 g (has no administration in time range)  cycloSPORINE (RESTASIS) 0.05 % ophthalmic emulsion 1 drop (1 drop Both Eyes Given 12/30/17 2154)  0.9 %  sodium chloride infusion ( Intravenous Stopped 12/30/17 2000)  ipratropium-albuterol (DUONEB) 0.5-2.5 (3) MG/3ML nebulizer solution 3 mL (3 mLs Nebulization Given 12/30/17 2016)  0.9 %  sodium chloride infusion ( Intravenous Stopped 12/30/17 2000)  methylPREDNISolone sodium succinate (SOLU-MEDROL) 40 mg/mL injection 40 mg (40 mg Intravenous Given 12/30/17 1832)  oseltamivir (TAMIFLU) capsule 75 mg (75 mg Oral Given 12/30/17 2154)  chlorhexidine (PERIDEX) 0.12 % solution 15 mL (15 mLs Mouth Rinse Not Given 12/30/17 2330)  MEDLINE mouth rinse (has no  administration in time range)  cefTRIAXone (ROCEPHIN) 1 g in sodium chloride 0.9 % 100 mL IVPB (0 g Intravenous Stopped 01/04/2018 1950)  azithromycin (ZITHROMAX) 500 mg in sodium chloride 0.9 % 250 mL IVPB (0 mg Intravenous Stopped 01/14/2018 2057)  acetaminophen (TYLENOL) tablet 1,000 mg (1,000 mg Oral Given 01/19/2018 1905)  furosemide (LASIX) injection 40 mg (40 mg Intravenous Given 12/30/17 2339)     Initial Impression / Assessment and Plan / ED Course  I have reviewed the triage vital signs and the nursing notes.  Pertinent labs & imaging results that were available during my care of the patient were reviewed by me and considered in my medical decision making (see chart for details).     Hypoxic respiratory failure and sepsis likely related to commute acquired pneumonia.  Multiple breathing treatments, antibiotics given.  Seems to be improving but still requiring oxygen which is not his baseline.  Plan for admission for further management work-up.  Final Clinical Impressions(s) / ED Diagnoses   Final diagnoses:  Community acquired pneumonia, unspecified laterality  Dyspnea, unspecified type  Acute respiratory failure with hypoxia (HCC)  Sepsis, due to unspecified organism, unspecified whether acute organ dysfunction present [facility])    ED Discharge Orders    None       Lipa Knauff, Corene Cornea, MD 12/31/17 [phone]

## 2017-12-31 NOTE — Progress Notes (Signed)
TRH Sign off note.  Due to concern for progressively worsening respiratory status/acute respiratory failure with hypoxia, I consulted PCCM on 12/11 and their input appreciated.  Patient was transferred same night to ICU for close monitoring because he is at high risk for intubation.  I met patient in his room along with Dr. Lake Bells.  Dr. Lake Bells has kindly taken over full care of patient on to [facility] service.  TRH will sign off at this time.  Please reconsult TRH for any further assistance.  Vernell Leep, MD, FACP, [facility]. [facility] Pager [phone]  If 7PM-7AM, please contact night-coverage [email or web address] Password [email or web address] 12/31/2017, 11:16 AM

## 2017-12-31 NOTE — Progress Notes (Signed)
[facility]  I met with the patient and his wife They confirm that they are OK with him being on life support if necessary Fortunately he is doing well on high flow O2 right now but I think he remains high risk for intubation Will continue close monitoring in the ICU  Roselie Awkward, MD [place] [facility] Pager: [phone] Cell: [phone] If no response, call [phone]

## 2018-01-01 ENCOUNTER — Inpatient Hospital Stay (HOSPITAL_COMMUNITY): Payer: Medicare Other

## 2018-01-01 DIAGNOSIS — I340 Nonrheumatic mitral (valve) insufficiency: Secondary | ICD-10-CM

## 2018-01-01 DIAGNOSIS — I361 Nonrheumatic tricuspid (valve) insufficiency: Secondary | ICD-10-CM

## 2018-01-01 LAB — BLOOD GAS, ARTERIAL
Acid-base deficit: 0 mmol/L (ref 0.0–2.0)
Bicarbonate: 22.5 mmol/L (ref 20.0–28.0)
Drawn by: 308601
FIO2: 65
O2 CONTENT: 25 L/min
O2 Saturation: 83.8 %
Patient temperature: 98.6
pCO2 arterial: 31.7 mmHg — ABNORMAL LOW (ref 32.0–48.0)
pH, Arterial: 7.465 — ABNORMAL HIGH (ref 7.350–7.450)
pO2, Arterial: 49 mmHg — ABNORMAL LOW (ref 83.0–108.0)

## 2018-01-01 LAB — ECHOCARDIOGRAM COMPLETE
Height: 71 in
Weight: 3312.19 oz

## 2018-01-01 MED ORDER — FUROSEMIDE 10 MG/ML IJ SOLN
40.0000 mg | Freq: Four times a day (QID) | INTRAMUSCULAR | Status: AC
  Administered 2018-01-01 (×2): 40 mg via INTRAVENOUS
  Filled 2018-01-01 (×2): qty 4

## 2018-01-01 NOTE — Progress Notes (Signed)
PULMONARY / CRITICAL CARE MEDICINE   NAME:  Justin Hester, MRN:  [id number], DOB:  1930-02-22, LOS: 3 ADMISSION DATE:  12/30/2017, CONSULTATION DATE:  12/30/2017 REFERRING MD:  Algis Liming, CHIEF COMPLAINT:  Dyspnea  BRIEF HISTORY:    84 year old male with obstructive sleep apnea and ILD admitted on December 29, 2017 with shortness of breath and acute on chronic respiratory failure with hypoxemia due to evidence of community-acquired pneumonia.   SIGNIFICANT PAST MEDICAL HISTORY   Obstructive sleep apnea, pulmonary nodules, DVT, gastroesophageal reflux disease, diabetes mellitus.  SIGNIFICANT EVENTS:  12/10 admission 12/11 move to ICU for close observation  STUDIES:   October 03, 2017 CT angiogram chest showed peripheral interlobular septal thickening, images somewhat blurry but there does appear to be peripheral honeycombing worse in the bases, heterogeneous changes with areas of lung spared, images independently reviewed no pulmonary embolism.  Radiology notes the findings are probable UIP October 30 blood work ANA negative, rheumatoid factor negative November 2019 lung function testing showed total lung capacity of 4.18 L 57% predicted DLCO 14.4 mL 42% predicted December 29, 2017 chest x-ray images independently reviewed showing bilateral airspace disease, some pleural effusion question cephalization December 11/2017 Labs: ANCA negative, Jo-1 negative, SSA/SSB negative, SCL-70 negative  CULTURES:  12/23/2017 blood >> 12/11 RSV > negative 12/11 Flu > negative 12/10 urine strep >   ANTIBIOTICS:  12/10 ceftriaxone  12/10 azithro  12/10 tamiflu x1 dose  LINES/TUBES:  12/13 BIPAP  CONSULTANTS:  Pulmonary medicine  SUBJECTIVE:  Back on BIPAP overnight Net negative volume status Still has significant desaturation Afebrile, WBC up, Fever down  CONSTITUTIONAL: BP 126/62   Pulse (!) 102   Temp (!) 97.5 F (36.4 C) (Oral)   Resp (!) 23   Ht 5' 11" (1.803 m)   Wt 93.9 kg    SpO2 93%   BMI 28.87 kg/m   I/O last 3 completed shifts: In: 983.2 [P.O.:680; I.V.:114; IV Piggyback:189.2] Out: 4000 [Urine:4000]     PHYSICAL EXAM:  General:  Resting comfortably in bed HENT: NCAT OP clear PULM: Crackles R base more than left B, normal effort CV: RRR, no mgr GI: BS+, soft, nontender MSK: normal bulk and tone Neuro: awake, alert, no distress, MAEW    RESOLVED PROBLEM LIST   ASSESSMENT AND PLAN   Acute respiratory failure with hypoxemia with multifocal infiltrates: broad ddx, see discussion on 12/11 note> feels better > repeat CXR today > continue to wean High Flow O2 for SaO2 > 88% > Out of bed > hold diuresis > continue empiric solumedrol > Continue close monitoring in ICU   Community-acquired pneumonia > continue ceftriaxone > continue azithromycin > continue solumedrol   Diffuse parenchymal lung disease: flare? > continue empiric steroids > f/u serology sent prior to steroid initiation  DVT > continue eliquis  Hypothyroid > synthroid   OSA > CPAP qHS  Mild normocytic anemia without bleeding Monitor for bleeding Transfuse PRBC for Hgb < 7 gm/dL      SUMMARY OF TODAY'S PLAN:  Read above  Best Practice / Goals of Care / Disposition.   DVT PROPHYLAXIS:eliquis SUP:n/a NUTRITION:reg diet MOBILITY:up ad lib GOALS OF CARE:full code FAMILY DISCUSSIONS: updated wife bedside on 12/13 DISPOSITION remain in ICU  LABS  Glucose No results for input(s): GLUCAP in the last 168 hours.  BMET Recent Labs  Lab 12/23/2017 1805 12/30/17 0544 12/31/17 0521  NA 139 139 137  K 4.3 4.1 4.0  CL 105 109 104  CO2 23 21* 21*  BUN 17  15 17  CREATININE 1.34* 1.10 1.15  GLUCOSE 93 100* 145*    Liver Enzymes Recent Labs  Lab 01/19/2018 1805  AST 20  ALT 24  ALKPHOS 53  BILITOT 1.1  ALBUMIN 3.3*    Electrolytes Recent Labs  Lab 01/15/2018 1805 12/30/17 0544 12/31/17 0521  CALCIUM 8.5* 7.9* 8.5*    CBC Recent Labs  Lab  12/24/2017 1805 12/30/17 0544 12/31/17 0521  WBC 9.9 10.8* 16.1*  HGB 12.9* 11.7* 13.0  HCT 40.1 36.6* 40.2  PLT 305 249 225    ABG No results for input(s): PHART, PCO2ART, PO2ART in the last 168 hours.  Coag's No results for input(s): APTT, INR in the last 168 hours.  Sepsis Markers Recent Labs  Lab 01/14/2018 1824 12/22/2017 2148  LATICACIDVEN 1.28 1.09    Cardiac Enzymes No results for input(s): TROPONINI, PROBNP in the last 168 hours.   My cc time 45 minutes  Roselie Awkward, MD [place] [facility] Pager: [phone] Cell: [phone] If no response, call [phone]

## 2018-01-01 NOTE — Progress Notes (Signed)
  Echocardiogram 2D Echocardiogram has been performed.  Bobbye Charleston 01/01/2018, 10:45 AM

## 2018-01-01 NOTE — Progress Notes (Signed)
[facility]  Echo reviewed: LVH, LAE enlarged, notable pulmonary hypertension  Lasix x2 again today  Roselie Awkward, MD [place] [facility] Pager: [phone] Cell: [phone] If no response, call [phone]

## 2018-01-01 NOTE — Progress Notes (Signed)
[facility] Progress Note Patient Name: Justin Hester DOB: May 01, 1930 MRN: [id number]   Date of Service  01/01/2018  HPI/Events of Note  Notified of episodes of confusion.  I was able to converse with patient. On high flow nasal cannula.  eICU Interventions  Ordered ABG to rule out significant hypercarbia that would cause the episodes of confusion.        Judd Lien 01/01/2018, 8:01 PM

## 2018-01-01 NOTE — Progress Notes (Deleted)
PULMONARY / CRITICAL CARE MEDICINE   NAME:  Justin Hester, MRN:  [id number], DOB:  04/21/1930, LOS: 3 ADMISSION DATE:  01/14/2018, CONSULTATION DATE:  12/30/2017 REFERRING MD:  Algis Liming, CHIEF COMPLAINT:  Dyspnea  BRIEF HISTORY:    84 year old male with obstructive sleep apnea and ILD admitted on December 29, 2017 with shortness of breath and acute on chronic respiratory failure with hypoxemia due to evidence of community-acquired pneumonia.   SIGNIFICANT PAST MEDICAL HISTORY   Obstructive sleep apnea, pulmonary nodules, DVT, gastroesophageal reflux disease, diabetes mellitus.  SIGNIFICANT EVENTS:  12/10 admission 12/11 move to ICU for close observation  STUDIES:   October 03, 2017 CT angiogram chest showed peripheral interlobular septal thickening, images somewhat blurry but there does appear to be peripheral honeycombing worse in the bases, heterogeneous changes with areas of lung spared, images independently reviewed no pulmonary embolism.  Radiology notes the findings are probable UIP October 30 blood work ANA negative, rheumatoid factor negative November 2019 lung function testing showed total lung capacity of 4.18 L 57% predicted DLCO 14.4 mL 42% predicted December 29, 2017 chest x-ray images showing bilateral airspace disease, some pleural effusion question cephalization ECHO 12/13 >>   CULTURES:  12/10 BCx2 >> 12/11 RSV > negative 12/11 Flu > negative 12/10 urine strep > negative  ANTIBIOTICS:  Ceftriaxone 12/10 >>  Azithromycin 12/10 >>  Tamiflu x1 dose   AUTOIMMUNE:  CCP 12/11 >> 14 (negative) SSA / SSB 12/11 >> less than 0.2 (negative) SCl-70 12/11 >> less than 0.2 (negative) Aldolase 7.8 12/11 >> (negative) Anti-Jo 12/12 >> less than 0.2 (negative) ANCA 12/12 >> negative   LINES/TUBES:  12/13 BIPAP  CONSULTANTS:  Pulmonary medicine  SUBJECTIVE:  Pt reports the O2 flow is bothering his nose / feels like it is too much.  He is hesitant to turn it up.  Requesting time to "recover" from desaturation episodes.  Wife at bedside.   CONSTITUTIONAL: BP 126/62   Pulse (!) 102   Temp (!) 97.5 F (36.4 C) (Oral)   Resp (!) 23   Ht _0  (1.803 m)   Wt 93.9 kg   SpO2 93%   BMI 28.87 kg/m   I/O last 3 completed shifts: In: 983.2 [P.O.:680; I.V.:114; IV Piggyback:189.2] Out: 4000 [Urine:4000]     PHYSICAL EXAM: General: elderly male lying in bed, eating breakfast  HEENT: MM pink/moist, no jvd Neuro: AAOx4, speech clear, MAE  CV: s1s2 rrr, no m/r/g PULM: even/non-labored, lungs bilaterally with posterior dry crackles  OB:SJGG, non-tender, bsx4 active  Extremities: warm/dry, trace BLE edema  Skin: no rashes or lesions   RESOLVED PROBLEM LIST    ASSESSMENT AND PLAN    Acute respiratory failure with hypoxemia with multifocal infiltrates -broad ddx: suspect CAP superimposed on probable UIP.  See discussion 12/11  -flu negative  P: Continue HFNC, 40L/80% Goal sat >88% Follow CXR  Continue empiric solumedrol 40 mg IV Q12 If intubated, will need FOB  High risk for decompensation, intubation  Community-acquired pneumonia P: Continue rocephin + azithromycin, D4/7 Solumedrol  Await ECHO   Diffuse parenchymal lung disease: flare? P: Continue empiric steroids  Autoimmune labs as above   DVT P: Continue home eliquis   Hypothyroid P: Continue synthroid   OSA P: CPAP QHS   Mild normocytic anemia without bleeding P: Trend CBC, monitor for bleeding  Transfuse per ICU guidelines, PRBC for Hgb<7%  Best Practice / Goals of Care / Disposition.   DVT PROPHYLAXIS:eliquis SUP:n/a NUTRITION:reg diet MOBILITY:up ad lib GOALS OF CARE:full  code FAMILY DISCUSSIONS: Wife, patient updated at bedside.   DISPOSITION: ICU  LABS  Glucose No results for input(s): GLUCAP in the last 168 hours.  BMET Recent Labs  Lab 01/09/2018 1805 12/30/17 0544 12/31/17 0521  NA 139 139 137  K 4.3 4.1 4.0  CL 105 109 104  CO2 23 21*  21*  BUN _0 CREATININE 1.34* 1.10 1.15  GLUCOSE 93 100* 145*    Liver Enzymes Recent Labs  Lab 01/13/2018 1805  AST 20  ALT 24  ALKPHOS 53  BILITOT 1.1  ALBUMIN 3.3*    Electrolytes Recent Labs  Lab 12/25/2017 1805 12/30/17 0544 12/31/17 0521  CALCIUM 8.5* 7.9* 8.5*    CBC Recent Labs  Lab 01/04/2018 1805 12/30/17 0544 12/31/17 0521  WBC 9.9 10.8* 16.1*  HGB 12.9* 11.7* 13.0  HCT 40.1 36.6* 40.2  PLT 305 249 225    ABG No results for input(s): PHART, PCO2ART, PO2ART in the last 168 hours.  Coag's No results for input(s): APTT, INR in the last 168 hours.  Sepsis Markers Recent Labs  Lab 12/28/2017 1824 01/14/2018 2148  LATICACIDVEN 1.28 1.09    Cardiac Enzymes No results for input(s): TROPONINI, PROBNP in the last 168 hours.   Noe Gens, NP-C [facility] Pgr: [phone] or if no answer [phone] 01/01/2018, 9:15 AM

## 2018-01-02 ENCOUNTER — Inpatient Hospital Stay (HOSPITAL_COMMUNITY): Payer: Medicare Other

## 2018-01-02 DIAGNOSIS — J9601 Acute respiratory failure with hypoxia: Secondary | ICD-10-CM

## 2018-01-02 DIAGNOSIS — J849 Interstitial pulmonary disease, unspecified: Secondary | ICD-10-CM

## 2018-01-02 LAB — BASIC METABOLIC PANEL
Anion gap: 12 (ref 5–15)
BUN: 55 mg/dL — ABNORMAL HIGH (ref 8–23)
CO2: 22 mmol/L (ref 22–32)
Calcium: 8.4 mg/dL — ABNORMAL LOW (ref 8.9–10.3)
Chloride: 105 mmol/L (ref 98–111)
Creatinine, Ser: 1.61 mg/dL — ABNORMAL HIGH (ref 0.61–1.24)
GFR calc Af Amer: 45 mL/min — ABNORMAL LOW (ref >60)
GFR calc non Af Amer: 38 mL/min — ABNORMAL LOW (ref >60)
Glucose, Bld: 137 mg/dL — ABNORMAL HIGH (ref 70–99)
Potassium: 4.1 mmol/L (ref 3.5–5.1)
Sodium: 139 mmol/L (ref 135–145)

## 2018-01-02 LAB — BLOOD GAS, ARTERIAL
ACID-BASE DEFICIT: 0.7 mmol/L (ref 0.0–2.0)
Acid-base deficit: 1.7 mmol/L (ref 0.0–2.0)
Bicarbonate: 22.9 mmol/L (ref 20.0–28.0)
Bicarbonate: 23.4 mmol/L (ref 20.0–28.0)
Delivery systems: POSITIVE
Drawn by: 308601
Drawn by: 331471
Expiratory PAP: 8
FIO2: 100
FIO2: 80
Inspiratory PAP: 15
MECHVT: 600 mL
Mode: POSITIVE
O2 SAT: 98.2 %
O2 Saturation: 94.3 %
PCO2 ART: 36.1 mmHg (ref 32.0–48.0)
PEEP: 10 cmH2O
PH ART: 7.417 (ref 7.350–7.450)
PO2 ART: 125 mmHg — AB (ref 83.0–108.0)
PO2 ART: 80.6 mmHg — AB (ref 83.0–108.0)
Patient temperature: 98.6
Patient temperature: 98.6
RATE: 16 resp/min
pCO2 arterial: 43.3 mmHg (ref 32.0–48.0)
pH, Arterial: 7.352 (ref 7.350–7.450)

## 2018-01-02 LAB — CBC
HEMATOCRIT: 40.2 % (ref 39.0–52.0)
Hemoglobin: 13.1 g/dL (ref 13.0–17.0)
MCH: 30.1 pg (ref 26.0–34.0)
MCHC: 32.6 g/dL (ref 30.0–36.0)
MCV: 92.4 fL (ref 80.0–100.0)
Platelets: 185 10*3/uL (ref 150–400)
RBC: 4.35 MIL/uL (ref 4.22–5.81)
RDW: 12.5 % (ref 11.5–15.5)
WBC: 18.6 10*3/uL — ABNORMAL HIGH (ref 4.0–10.5)
nRBC: 0 % (ref 0.0–0.2)

## 2018-01-02 LAB — EXPECTORATED SPUTUM ASSESSMENT W REFEX TO RESP CULTURE

## 2018-01-02 LAB — GLUCOSE, CAPILLARY
Glucose-Capillary: 118 mg/dL — ABNORMAL HIGH (ref 70–99)
Glucose-Capillary: 138 mg/dL — ABNORMAL HIGH (ref 70–99)
Glucose-Capillary: 154 mg/dL — ABNORMAL HIGH (ref 70–99)
Glucose-Capillary: 158 mg/dL — ABNORMAL HIGH (ref 70–99)

## 2018-01-02 LAB — PHOSPHORUS
Phosphorus: 4.4 mg/dL (ref 2.5–4.6)
Phosphorus: 5.9 mg/dL — ABNORMAL HIGH (ref 2.5–4.6)

## 2018-01-02 LAB — EXPECTORATED SPUTUM ASSESSMENT W GRAM STAIN, RFLX TO RESP C

## 2018-01-02 LAB — MAGNESIUM
Magnesium: 3.1 mg/dL — ABNORMAL HIGH (ref 1.7–2.4)
Magnesium: 2.8 mg/dL — ABNORMAL HIGH (ref 1.7–2.4)

## 2018-01-02 MED ORDER — BISACODYL 10 MG RE SUPP: 10.0000 mg | Freq: Every day | RECTAL | Status: DC | PRN

## 2018-01-02 MED ORDER — LOSARTAN POTASSIUM 50 MG PO TABS
100.0000 mg | ORAL_TABLET | Freq: Every day | ORAL | Status: DC
  Administered 2018-01-03: 100 mg
  Filled 2018-01-02: qty 2

## 2018-01-02 MED ORDER — FENTANYL 2500MCG IN NS 250ML (10MCG/ML) PREMIX INFUSION
0.0000 ug/h | INTRAVENOUS | Status: DC
  Administered 2018-01-02: 25 ug/h via INTRAVENOUS
  Administered 2018-01-03: 350 ug/h via INTRAVENOUS
  Administered 2018-01-03: 325 ug/h via INTRAVENOUS
  Administered 2018-01-04: 400 ug/h via INTRAVENOUS
  Administered 2018-01-04: 350 ug/h via INTRAVENOUS
  Administered 2018-01-04 (×2): 400 ug/h via INTRAVENOUS
  Administered 2018-01-05: 350 ug/h via INTRAVENOUS
  Administered 2018-01-05: 400 ug/h via INTRAVENOUS
  Administered 2018-01-05 (×2): 375 ug/h via INTRAVENOUS
  Administered 2018-01-06: 350 ug/h via INTRAVENOUS
  Administered 2018-01-06: 400 ug/h via INTRAVENOUS
  Administered 2018-01-06: 350 ug/h via INTRAVENOUS
  Administered 2018-01-07 – 2018-01-08 (×4): 400 ug/h via INTRAVENOUS
  Filled 2018-01-02 (×19): qty 250

## 2018-01-02 MED ORDER — LEVOTHYROXINE SODIUM 50 MCG PO TABS
50.0000 ug | ORAL_TABLET | Freq: Every day | ORAL | Status: DC
  Administered 2018-01-04 – 2018-01-08 (×5): 50 ug
  Filled 2018-01-02 (×5): qty 1

## 2018-01-02 MED ORDER — FENTANYL CITRATE (PF) 100 MCG/2ML IJ SOLN
50.0000 ug | INTRAMUSCULAR | Status: DC | PRN
  Administered 2018-01-03: 50 ug via INTRAVENOUS

## 2018-01-02 MED ORDER — MIDAZOLAM HCL 2 MG/2ML IJ SOLN
INTRAMUSCULAR | Status: AC
  Filled 2018-01-02: qty 4

## 2018-01-02 MED ORDER — MIDAZOLAM HCL 2 MG/2ML IJ SOLN
1.0000 mg | INTRAMUSCULAR | Status: DC | PRN
  Administered 2018-01-02 – 2018-01-03 (×8): 1 mg via INTRAVENOUS
  Filled 2018-01-02 (×8): qty 2

## 2018-01-02 MED ORDER — FAMOTIDINE IN NACL 20-0.9 MG/50ML-% IV SOLN
20.0000 mg | INTRAVENOUS | Status: DC
  Administered 2018-01-02 – 2018-01-03 (×2): 20 mg via INTRAVENOUS
  Filled 2018-01-02 (×2): qty 50

## 2018-01-02 MED ORDER — DEXTROMETHORPHAN POLISTIREX ER 30 MG/5ML PO SUER
30.0000 mg | Freq: Two times a day (BID) | ORAL | Status: DC
  Administered 2018-01-02 – 2018-01-07 (×11): 30 mg
  Filled 2018-01-02 (×12): qty 5

## 2018-01-02 MED ORDER — MIDAZOLAM HCL 2 MG/2ML IJ SOLN
1.0000 mg | INTRAMUSCULAR | Status: AC | PRN
  Administered 2018-01-02 (×2): 1 mg via INTRAVENOUS
  Filled 2018-01-02 (×4): qty 2

## 2018-01-02 MED ORDER — ACETAMINOPHEN 160 MG/5ML PO SOLN: 650.0000 mg | Freq: Four times a day (QID) | ORAL | Status: DC | PRN

## 2018-01-02 MED ORDER — VITAL HIGH PROTEIN PO LIQD
1000.0000 mL | ORAL | Status: DC
  Administered 2018-01-02 – 2018-01-03 (×3): 1000 mL

## 2018-01-02 MED ORDER — DOCUSATE SODIUM 50 MG/5ML PO LIQD: 100.0000 mg | Freq: Two times a day (BID) | ORAL | Status: DC | PRN

## 2018-01-02 MED ORDER — POLYETHYLENE GLYCOL 3350 17 G PO PACK
17.0000 g | PACK | Freq: Every day | ORAL | Status: DC
  Administered 2018-01-03 – 2018-01-07 (×5): 17 g
  Filled 2018-01-02 (×5): qty 1

## 2018-01-02 MED ORDER — FENTANYL CITRATE (PF) 100 MCG/2ML IJ SOLN
INTRAMUSCULAR | Status: AC
  Filled 2018-01-02: qty 4

## 2018-01-02 MED ORDER — PANCRELIPASE (LIP-PROT-AMYL) 10440-39150 UNITS PO TABS
20880.0000 [IU] | ORAL_TABLET | Freq: Once | ORAL | Status: AC
  Administered 2018-01-03: 20880 [IU]
  Filled 2018-01-02: qty 2

## 2018-01-02 MED ORDER — FENTANYL CITRATE (PF) 100 MCG/2ML IJ SOLN
50.0000 ug | INTRAMUSCULAR | Status: DC | PRN
  Administered 2018-01-02 (×3): 50 ug via INTRAVENOUS
  Filled 2018-01-02 (×3): qty 2

## 2018-01-02 MED ORDER — GUAIFENESIN 100 MG/5ML PO SOLN
15.0000 mL | Freq: Four times a day (QID) | ORAL | Status: DC
  Administered 2018-01-02 – 2018-01-08 (×21): 300 mg
  Filled 2018-01-02: qty 30
  Filled 2018-01-02 (×4): qty 20
  Filled 2018-01-02: qty 10
  Filled 2018-01-02 (×2): qty 20
  Filled 2018-01-02 (×2): qty 10
  Filled 2018-01-02 (×5): qty 20
  Filled 2018-01-02 (×2): qty 10
  Filled 2018-01-02 (×2): qty 20
  Filled 2018-01-02: qty 10
  Filled 2018-01-02: qty 20

## 2018-01-02 MED ORDER — AMLODIPINE BESYLATE 10 MG PO TABS
10.0000 mg | ORAL_TABLET | Freq: Every day | ORAL | Status: DC
  Administered 2018-01-03 – 2018-01-07 (×4): 10 mg
  Filled 2018-01-02 (×4): qty 1

## 2018-01-02 MED ORDER — DEXMEDETOMIDINE HCL IN NACL 200 MCG/50ML IV SOLN
0.0000 ug/kg/h | INTRAVENOUS | Status: DC
  Administered 2018-01-02: 0.5 ug/kg/h via INTRAVENOUS
  Administered 2018-01-02: 0.4 ug/kg/h via INTRAVENOUS
  Filled 2018-01-02: qty 50
  Filled 2018-01-02: qty 100
  Filled 2018-01-02: qty 50

## 2018-01-02 MED ORDER — MIDAZOLAM HCL 2 MG/2ML IJ SOLN
4.0000 mg | Freq: Once | INTRAMUSCULAR | Status: AC
  Administered 2018-01-02: 4 mg via INTRAVENOUS

## 2018-01-02 MED ORDER — FENTANYL CITRATE (PF) 100 MCG/2ML IJ SOLN
100.0000 ug | Freq: Once | INTRAMUSCULAR | Status: AC
  Administered 2018-01-02: 100 ug via INTRAVENOUS

## 2018-01-02 MED ORDER — ACETAMINOPHEN 650 MG RE SUPP: 650.0000 mg | RECTAL | Status: DC | PRN

## 2018-01-02 MED ORDER — LEVOTHYROXINE SODIUM 50 MCG PO TABS
50.0000 ug | ORAL_TABLET | ORAL | Status: DC
  Administered 2018-01-04 – 2018-01-08 (×2): 50 ug
  Filled 2018-01-02 (×2): qty 1

## 2018-01-02 MED ORDER — APIXABAN 5 MG PO TABS
5.0000 mg | ORAL_TABLET | Freq: Two times a day (BID) | ORAL | Status: DC
  Administered 2018-01-02: 5 mg
  Filled 2018-01-02: qty 1

## 2018-01-02 MED ORDER — PRO-STAT SUGAR FREE PO LIQD
30.0000 mL | Freq: Two times a day (BID) | ORAL | Status: DC
  Administered 2018-01-02 – 2018-01-03 (×3): 30 mL
  Filled 2018-01-02 (×3): qty 30

## 2018-01-02 MED ORDER — ROCURONIUM BROMIDE 50 MG/5ML IV SOLN
1.0000 mg/kg | Freq: Once | INTRAVENOUS | Status: AC
  Administered 2018-01-02: 50 mg via INTRAVENOUS

## 2018-01-02 MED ORDER — SODIUM BICARBONATE 650 MG PO TABS
650.0000 mg | ORAL_TABLET | Freq: Once | ORAL | Status: AC
  Administered 2018-01-03: 650 mg
  Filled 2018-01-02: qty 1

## 2018-01-02 MED ORDER — ETOMIDATE 2 MG/ML IV SOLN
0.3000 mg/kg | Freq: Once | INTRAVENOUS | Status: AC
  Administered 2018-01-02: 20 mg via INTRAVENOUS

## 2018-01-02 MED ORDER — SODIUM CHLORIDE 0.9 % IV BOLUS
500.0000 mL | Freq: Once | INTRAVENOUS | Status: AC
  Administered 2018-01-02: 500 mL via INTRAVENOUS

## 2018-01-02 NOTE — Progress Notes (Signed)
PULMONARY / CRITICAL CARE MEDICINE   NAME:  Justin Hester, MRN:  [id number], DOB:  October 07, 1930, LOS: 4 ADMISSION DATE:  12/30/2017, CONSULTATION DATE:  12/30/2017 REFERRING MD:  Algis Liming, CHIEF COMPLAINT:  Dyspnea  BRIEF HISTORY:    84 year old male with obstructive sleep apnea and ILD admitted on December 29, 2017 with shortness of breath and acute on chronic respiratory failure with hypoxemia due to evidence of community-acquired pneumonia.   SIGNIFICANT PAST MEDICAL HISTORY   Obstructive sleep apnea, pulmonary nodules, DVT, gastroesophageal reflux disease, diabetes mellitus.  SIGNIFICANT EVENTS:  12/10 admission 12/11 move to ICU for close observation  STUDIES:   October 03, 2017 CT angiogram chest showed peripheral interlobular septal thickening, images somewhat blurry but there does appear to be peripheral honeycombing worse in the bases, heterogeneous changes with areas of lung spared, images independently reviewed no pulmonary embolism.  Radiology notes the findings are probable UIP October 30 blood work ANA negative, rheumatoid factor negative November 2019 lung function testing showed total lung capacity of 4.18 L 57% predicted DLCO 14.4 mL 42% predicted December 29, 2017 chest x-ray images independently reviewed showing bilateral airspace disease, some pleural effusion question cephalization December 11/2017 Labs: ANCA negative, Jo-1 negative, SSA/SSB negative, SCL-70 negative  CULTURES:  01/11/2018 blood >> 12/11 RSV > negative 12/11 Flu > negative 12/10 urine strep >   ANTIBIOTICS:  12/10 ceftriaxone  12/10 azithro  12/10 tamiflu x1 dose  LINES/TUBES:  12/13 BIPAP  CONSULTANTS:  Pulmonary medicine  SUBJECTIVE:  Confusion overnight  Climbed out of bed, became very disoriented, hypoxemic Renal funciton worse  CONSTITUTIONAL: BP 136/78   Pulse 96   Temp 98.3 F (36.8 C) (Oral)   Resp 18   Ht _0  (1.803 m)   Wt 93.9 kg   SpO2 93%   BMI 28.87 kg/m    I/O last 3 completed shifts: In: 618.2 [IV Piggyback:618.2] Out: 3016 [Urine:1275]     PHYSICAL EXAM:  General:  Resting comfortably in bed HENT: NCAT OP clear PULM: Crackles bases B, normal effort CV: RRR, no mgr GI: BS+, soft, nontender MSK: normal bulk and tone Neuro: awake, still slightly confused but following commands     RESOLVED PROBLEM LIST   ASSESSMENT AND PLAN   Acute respiratory failure with hypoxemia with multifocal infiltrates: broad ddx, see discussion on 12/11 note> overnight his oxygenation worsened and is now complicated by confusion, AKI; no convincing evidence of concomitant pulmonary edema > Needs intubation, mechanical ventilation > will perform bronchoscopy with BAL, cell count > hold diuresis > VAP prevention > continue solumedrol  Community-acquired pneumonia > continue ceftriaxone > continue azithromycin  Diffuse parenchymal lung disease: flare? > continue solumedrol > f/u bronchoscopy  Hypertension > continue home medications  DVT > continue eliquis  Hypothyroid > continue synthroid  Acute encephalopathy/ICU delirium > will need sedation for vent synchrony per PAD protocol > RASS goal -2  Mild normocytic anemia without bleeding > Monitor for bleeding > Transfuse PRBC for Hgb < 7 gm/dL    SUMMARY OF TODAY'S PLAN:  Read above  Best Practice / Goals of Care / Disposition.   DVT PROPHYLAXIS: Eliquis SUP:n/a NUTRITION: tube feeding MOBILITY:up ad lib GOALS OF CARE:full code FAMILY DISCUSSIONS: updated wife by phone on 12/14 DISPOSITION remain in ICU  LABS  Glucose Recent Labs  Lab 01/01/18 2357  GLUCAP 118*    BMET Recent Labs  Lab 12/30/17 0544 12/31/17 0521 01/02/18 0234  NA 139 137 139  K 4.1 4.0 4.1  CL 109 104  105  CO2 21* 21* 22  BUN 15 17 55*  CREATININE 1.10 1.15 1.61*  GLUCOSE 100* 145* 137*    Liver Enzymes Recent Labs  Lab 01/18/2018 1805  AST 20  ALT 24  ALKPHOS 53  BILITOT 1.1   ALBUMIN 3.3*    Electrolytes Recent Labs  Lab 12/30/17 0544 12/31/17 0521 01/02/18 0234  CALCIUM 7.9* 8.5* 8.4*    CBC Recent Labs  Lab 12/30/17 0544 12/31/17 0521 01/02/18 0234  WBC 10.8* 16.1* 18.6*  HGB 11.7* 13.0 13.1  HCT 36.6* 40.2 40.2  PLT 249 225 185    ABG Recent Labs  Lab 01/01/18 1955 01/02/18 0010  PHART 7.465* 7.417  PCO2ART 31.7* 36.1  PO2ART 49.0* 125*    Coag's No results for input(s): APTT, INR in the last 168 hours.  Sepsis Markers Recent Labs  Lab 12/28/2017 1824 12/31/2017 2148  LATICACIDVEN 1.28 1.09    Cardiac Enzymes No results for input(s): TROPONINI, PROBNP in the last 168 hours.   My cc time 35 minutes  Roselie Awkward, MD [facility] Pager: [phone] Cell: [phone] If no response, call [phone]

## 2018-01-02 NOTE — Progress Notes (Signed)
Charge nurse genelle entered room found patient with hiflo nasal cannula off, attempting to climb out of bed. Pulse ox had been pulled out and was not reading. Entered room to assist. Found pt sweaty, disoriented, unresponsive with labored breathing/grunting. hiflo cannula back in place, assisted pt back to bed, placed new pulse ox reading 57% on hiflo nasal cannula. Placed pt on bipap, called respiratory and elink. After a few minutes on bipap, pt began following commands (squeezing hands, opening eyes), pupils are reactive. Obtained CBG reading 118. sats in 90's on 100% on bipap. ABG ordered and obtained. Instructed by Elink MD to leave pt on bipap at this time. Pt remains nonverbal at this time, but appears to be resting comfortably. Will continue to closely monitor. Bed alarm set.

## 2018-01-02 NOTE — Procedures (Signed)
Intubation Procedure Note Konstantine Gervasi [id number] [birth year]  Procedure: Intubation Indications: Respiratory insufficiency  Procedure Details Consent: Risks of procedure as well as the alternatives and risks of each were explained to the (patient/caregiver).  Consent for procedure obtained. Time Out: Verified patient identification, verified procedure, site/side was marked, verified correct patient position, special equipment/implants available, medications/allergies/relevent history reviewed, required imaging and test results available.  Performed  Drugs Etomidate 65m IV, Fentanyl 1037m IV, Versed 71m71mV DL x 1 with GS 3 blade Grade 1 view 8.0 ET tube passed through cords under direct visualization Placement confirmed with bilateral breath sounds, positive EtCO2 change and smoke in tube   Evaluation Hemodynamic Status: BP stable throughout; O2 sats: stable throughout Patient's Current Condition: stable Complications: No apparent complications Patient did tolerate procedure well. Chest X-ray ordered to verify placement.  CXR: pending.   DouJahfari Ambers/14/2019

## 2018-01-02 NOTE — Progress Notes (Signed)
Pharmacy Brief Note - Renal Dose Adjustment:  Famotidine 20 mg IV q12h changed to 20 mg IV q24h for CrCl 39 mL/min.   Lenis Noon, PharmD 01/02/18 9:27 AM

## 2018-01-02 NOTE — Progress Notes (Signed)
Entered room for bedside shift report. Pt is pleasant, but states that he does not know where he is. Says that he woke up confused because he thought he was at home. Pt had to be told that he was in the hospital. Pt requested to speak with wife. Conversation with her reveals that this is a new development, while he was turned around by which room he was in, he's been able to state that he's in the hospital. Pt is easy to reoriented. Alert and oriented x4 after discussion. elink notified. ABG ordered and obtained.

## 2018-01-02 NOTE — Progress Notes (Signed)
[place] Progress Note Patient Name: Justin Hester DOB: Feb 10, 1930 MRN: [id number]   Date of Service  01/02/2018  HPI/Events of Note  Patient was noted to be unresponsive and hypoxic.  He was seen with his high flow nasal cannula off.  eICU Interventions  Patient placed on BiPap 15/8 FiO2 100%.  ABG ordered.  Patient starting to wake up and following commands.     Intervention Category Major Interventions: Change in mental status - evaluation and management;Hypoxemia - evaluation and management  Judd Lien 01/02/2018, 12:04 AM

## 2018-01-02 NOTE — Progress Notes (Signed)
Brief Nutrition Note  Consult received for enteral/tube feeding initiation and management.  Adult Enteral Nutrition Protocol initiated. Full assessment to follow.  Admitting Dx: Acute respiratory failure with hypoxia (HCC) [J96.01] Dyspnea, unspecified type [R06.00] Community acquired pneumonia, unspecified laterality [J18.9] Sepsis, due to unspecified organism, unspecified whether acute organ dysfunction present ([place]) [A41.9] CAP (community acquired pneumonia) [J18.9]  Body mass index is 28.87 kg/m. Pt meets criteria for overweight based on current BMI.  Labs:  Recent Labs  Lab 12/30/17 0544 12/31/17 0521 01/02/18 0234  NA 139 137 139  K 4.1 4.0 4.1  CL 109 104 105  CO2 21* 21* 22  BUN 15 17 55*  CREATININE 1.10 1.15 1.61*  CALCIUM 7.9* 8.5* 8.4*  GLUCOSE 100* 145* 137*     Jarome Matin, MS, RD, LDN, [facility] Inpatient Clinical Dietitian Pager # [phone] After hours/weekend pager # [phone]

## 2018-01-02 NOTE — Procedures (Signed)
PCCM Video Bronchoscopy Procedure Note  The patient was informed of the risks (including but not limited to bleeding, infection, respiratory failure, lung injury, tooth/oral injury) and benefits of the procedure and gave consent, see chart.  Indication: Acute respiratory failure with hypoxemia  Post Procedure Diagnosis: same  Location: [facility] ICU  Condition pre procedure: Critically ill, on vent  Medications for procedure: versed, fentanyl pushes for intubation  Procedure description: The bronchoscope was introduced through the endotracheal tube and passed to the bilateral lungs to the level of the subsegmental bronchi throughout the tracheobronchial tree.  Airway exam revealed normal appearing airways, no secretions, masses or inflammation.  Procedures performed: BAL in right middle lobe: 60cc sterile saline injected, 20cc clear return  Specimens sent: cell count, culture (bacterial, fungal, afb)  Condition post procedure: critically ill, on vent  EBL: none  Complications: none immediate  Roselie Awkward, MD [place] Pager: [phone] Cell: [phone] If no response, call [phone]

## 2018-01-03 ENCOUNTER — Inpatient Hospital Stay (HOSPITAL_COMMUNITY): Payer: Medicare Other

## 2018-01-03 LAB — BASIC METABOLIC PANEL
ANION GAP: 12 (ref 5–15)
BUN: 68 mg/dL — ABNORMAL HIGH (ref 8–23)
CALCIUM: 8.4 mg/dL — AB (ref 8.9–10.3)
CO2: 21 mmol/L — ABNORMAL LOW (ref 22–32)
Chloride: 113 mmol/L — ABNORMAL HIGH (ref 98–111)
Creatinine, Ser: 1.68 mg/dL — ABNORMAL HIGH (ref 0.61–1.24)
GFR calc non Af Amer: 36 mL/min — ABNORMAL LOW (ref >60)
GFR, EST AFRICAN AMERICAN: 42 mL/min — AB (ref >60)
Glucose, Bld: 150 mg/dL — ABNORMAL HIGH (ref 70–99)
Potassium: 4.7 mmol/L (ref 3.5–5.1)
Sodium: 146 mmol/L — ABNORMAL HIGH (ref 135–145)

## 2018-01-03 LAB — CBC WITH DIFFERENTIAL/PLATELET
Abs Immature Granulocytes: 0.14 10*3/uL — ABNORMAL HIGH (ref 0.00–0.07)
Basophils Absolute: 0 10*3/uL (ref 0.0–0.1)
Basophils Relative: 0 %
EOS ABS: 0 10*3/uL (ref 0.0–0.5)
Eosinophils Relative: 0 %
HEMATOCRIT: 40.2 % (ref 39.0–52.0)
Hemoglobin: 12.5 g/dL — ABNORMAL LOW (ref 13.0–17.0)
Immature Granulocytes: 1 %
Lymphocytes Relative: 4 %
Lymphs Abs: 0.7 10*3/uL (ref 0.7–4.0)
MCH: 29.6 pg (ref 26.0–34.0)
MCHC: 31.1 g/dL (ref 30.0–36.0)
MCV: 95 fL (ref 80.0–100.0)
Monocytes Absolute: 1.2 10*3/uL — ABNORMAL HIGH (ref 0.1–1.0)
Monocytes Relative: 8 %
Neutro Abs: 14.3 10*3/uL — ABNORMAL HIGH (ref 1.7–7.7)
Neutrophils Relative %: 87 %
Platelets: 121 10*3/uL — ABNORMAL LOW (ref 150–400)
RBC: 4.23 MIL/uL (ref 4.22–5.81)
RDW: 12.8 % (ref 11.5–15.5)
WBC: 16.3 10*3/uL — ABNORMAL HIGH (ref 4.0–10.5)
nRBC: 0 % (ref 0.0–0.2)

## 2018-01-03 LAB — GLUCOSE, CAPILLARY
GLUCOSE-CAPILLARY: 132 mg/dL — AB (ref 70–99)
Glucose-Capillary: 135 mg/dL — ABNORMAL HIGH (ref 70–99)
Glucose-Capillary: 118 mg/dL — ABNORMAL HIGH (ref 70–99)
Glucose-Capillary: 143 mg/dL — ABNORMAL HIGH (ref 70–99)

## 2018-01-03 LAB — PHOSPHORUS
Phosphorus: 4.2 mg/dL (ref 2.5–4.6)
Phosphorus: 4.5 mg/dL (ref 2.5–4.6)

## 2018-01-03 LAB — MAGNESIUM
MAGNESIUM: 3 mg/dL — AB (ref 1.7–2.4)
Magnesium: 3.4 mg/dL — ABNORMAL HIGH (ref 1.7–2.4)

## 2018-01-03 LAB — ACID FAST SMEAR (AFB, MYCOBACTERIA): Acid Fast Smear: NEGATIVE

## 2018-01-03 MED ORDER — CHLORHEXIDINE GLUCONATE 0.12% ORAL RINSE (MEDLINE KIT)
15.0000 mL | Freq: Two times a day (BID) | OROMUCOSAL | Status: DC
  Administered 2018-01-03 – 2018-01-08 (×10): 15 mL via OROMUCOSAL

## 2018-01-03 MED ORDER — SODIUM CHLORIDE 0.9 % IV SOLN
250.0000 mg | Freq: Four times a day (QID) | INTRAVENOUS | Status: DC
  Administered 2018-01-03 – 2018-01-07 (×16): 250 mg via INTRAVENOUS
  Filled 2018-01-03 (×18): qty 2

## 2018-01-03 MED ORDER — MIDAZOLAM HCL 2 MG/2ML IJ SOLN
1.0000 mg | INTRAMUSCULAR | Status: DC | PRN
  Administered 2018-01-04 – 2018-01-07 (×11): 1 mg via INTRAVENOUS
  Filled 2018-01-03 (×14): qty 2

## 2018-01-03 MED ORDER — VITAL AF 1.2 CAL PO LIQD
1000.0000 mL | ORAL | Status: DC
  Administered 2018-01-03 – 2018-01-06 (×3): 1000 mL

## 2018-01-03 MED ORDER — ORAL CARE MOUTH RINSE
15.0000 mL | OROMUCOSAL | Status: DC
  Administered 2018-01-03 – 2018-01-08 (×45): 15 mL via OROMUCOSAL

## 2018-01-03 NOTE — Progress Notes (Signed)
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 Cyclobenzaprine     Rash, chills, shaking  . Oxycodone-Acetaminophen     REACTION: unspecified  . Propoxyphene N-Acetaminophen     REACTION: itching---hot and cold flashes    Patient Measurements: Height: _0  (180.3 cm) Weight: 201 lb 8 oz (91.4 kg) IBW/kg (Calculated) : 75.3 Heparin Dosing Weight: TBW  Vital Signs: Temp: 99.9 F (37.7 C) (12/15 0800) BP: 150/64 (12/15 0800) Pulse Rate: 103 (12/15 0800)  Labs: Recent Labs    01/02/18 0234 01/03/18 0326  HGB 13.1 12.5*  HCT 40.2 40.2  PLT 185 121*  CREATININE 1.61* 1.68*    Estimated Creatinine Clearance: 37.1 mL/min (A) (by C-G formula based on SCr of 1.68 mg/dL (H)).   Medical History: Past Medical History:  Diagnosis Date  . Anemia   . Cataract   . Chronic constipation   . Diverticulosis of colon (without mention of hemorrhage)    constipation - new problem 3/12 and work up in progress...  . DJD (degenerative joint disease)   . DVT (deep venous thrombosis) (HCC) 1990s   6 mo coumadin  . GERD (gastroesophageal reflux disease)   . Hypertension   . Hypothyroidism   . Lower leg DVT (deep venous thromboembolism), acute, left ([place]) 06/15/2016  . Lumbar back pain   . Multiple pulmonary nodules 2006  . OSA on CPAP     Medications:  Medications Prior to Admission  Medication Sig Dispense Refill Last Dose  . acetaminophen (TYLENOL) 500 MG tablet Take 500 mg by mouth every 6 (six) hours as needed for mild pain.    Past Month at Unknown time  . amLODipine (NORVASC) 10 MG tablet Take 1 tablet (10 mg total) by mouth daily. 90 tablet 3 12/28/2017 at Unknown time  . apixaban (ELIQUIS) 5 MG TABS tablet Take 1 tablet (5 mg total) by mouth 2 (two) times daily. 180 tablet 3 12/24/2017 at 900 am  . benzonatate (TESSALON) 200 MG capsule Take 1 capsule (200 mg total) by mouth 3  (three) times daily as needed for cough. 30 capsule 1 12/28/2017 at Unknown time  . cycloSPORINE (RESTASIS) 0.05 % ophthalmic emulsion Place 1 drop into both eyes 2 (two) times daily.   01/09/2018 at Unknown time  . Desoximetasone 0.05 % GEL Apply 1 application topically daily.    12/27/2017 at Unknown time  . furosemide (LASIX) 40 MG tablet Take 1 tablet (40 mg total) by mouth daily. 90 tablet 3 01/12/2018 at Unknown time  . levothyroxine (SYNTHROID, LEVOTHROID) 50 MCG tablet TAKE 1 TABLET BY MOUTH EVERY DAY; EXCEPT 2 TABLETS DAILY ON TUESDAY AND FRIDAY (Patient taking differently: TAKE 1 TABLET BY MOUTH EVERY DAY; EXCEPT 2 TABLETS DAILY ON Monday AND FRIDAY) 120 tablet 3 01/19/2018 at Unknown time  . losartan (COZAAR) 100 MG tablet Take 1 tablet (100 mg total) by mouth daily. 90 tablet 3 12/20/2017 at Unknown time  . polyethylene glycol (MIRALAX / GLYCOLAX) packet Take 17 g by mouth as needed.    12/28/2017 at Unknown time   Scheduled:  . amLODipine  10 mg Per Tube Daily  . chlorhexidine  15 mL Mouth Rinse BID  . chlorhexidine gluconate (MEDLINE KIT)  15 mL Mouth Rinse BID  . cycloSPORINE  1 drop Both Eyes BID  . dextromethorphan  30 mg Per Tube BID  . feeding supplement (PRO-STAT SUGAR FREE 64)  30 mL Per  Tube BID  . feeding supplement (VITAL HIGH PROTEIN)  1,000 mL Per Tube Q24H  . guaiFENesin  15 mL Per Tube Q6H  . ipratropium-albuterol  3 mL Nebulization TID  . levothyroxine  50 mcg Per Tube Q0600  . [START ON 01/04/2018] levothyroxine  50 mcg Per Tube Once per day on Mon Fri  . losartan  100 mg Per Tube Daily  . mouth rinse  15 mL Mouth Rinse q12n4p  . mouth rinse  15 mL Mouth Rinse 10 times per day  . polyethylene glycol  17 g Per Tube Daily   PRN: acetaminophen (TYLENOL) oral liquid 160 mg/5 mL **OR** acetaminophen, bisacodyl, docusate, fentaNYL (SUBLIMAZE) injection, fentaNYL (SUBLIMAZE) injection, ipratropium-albuterol, midazolam, ondansetron **OR** ondansetron (ZOFRAN) IV,  polyethylene glycol  Assessment: 64 yoM on Eliquis for Hx recurrent DVT, admitted for acute respiratory failure d/t CAP vs ILD flare. Intubated 12/14 and bleeding noted after NGT placement. MD also concerned for possible Eliquis-induced lung injury (see CCM note), so would like to hold Eliquis and transition patient to IV heparin tomorrow   Baseline INR, aPTT: Pending (on Eliquis)  Prior anticoagulation: Eliquis 5 mg PO bid; LD 12/14 at 2100 (missed AM dose)  Significant events:  Today, 01/03/2018:  CBC: Hgb low but overall stable this admission; Plt lower today; < 50% of admission level but no recent heparin exposure so no concern for HIT  CCM MD noted nosebleed vs pharyngeal bleeding after NG placement - asking to start heparin tomorrow if no further bleeding  Goal of Therapy: Heparin level 0.3-0.7 units/ml Monitor platelets by anticoagulation protocol: Yes  Plan:  No anticoagulation today  Check w/ MD and RN tomorrow AM; if no bleeding would start heparin at 1300 units/hr  Daily CBC while on heparin, check aPTT while HL elevated from Eliquis  Monitor for s/s bleeding   Reuel Boom, PharmD, BCPS [phone] 01/03/2018, 9:29 AM

## 2018-01-03 NOTE — Progress Notes (Signed)
[facility]  RN notes isolated arm twitching for about 15 seconds, three separate occassions.  EEG ordered  Instructed to use versed 62m IV if occurs again and doesn't stop after 30 seconds  BRoselie Awkward MD [facility] Pager: [phone]: [phone] no response, call [phone]

## 2018-01-03 NOTE — Progress Notes (Signed)
Patient experience uncontrolled shaking of right upper arm and shoulder x3 occurrences each occurrence lasting less than fifteen seconds.  Pt. VSS throughout each episode.  Dr. Lake Bells updated on patients condition MD to place order for EEG.  RN will continue to monitor.

## 2018-01-03 NOTE — Progress Notes (Signed)
eLink Physician-Brief Progress Note Patient Name: Rigley Niess DOB: 10-24-1930 MRN: [id number]   Date of Service  01/03/2018  HPI/Events of Note  Agitation   eICU Interventions  Will increase Versed 1 mg IV to Q 1 hour PRN.     Intervention Category Minor Interventions: Agitation / anxiety - evaluation and management  Adarian Bur Eugene 01/03/2018, 11:42 PM

## 2018-01-03 NOTE — Progress Notes (Signed)
Initial Nutrition Assessment  INTERVENTION:   -D/c Vital HP -Vital AF 1.2 @ 20 ml/hr per MD via NGT -When able to advance, recommend by 10 ml every 8 hours to goal rate of 70 ml/hr. This will provide 2016 kcal, 126g protein and 1362 ml H2O.  NUTRITION DIAGNOSIS:   Inadequate oral intake related to inability to eat as evidenced by NPO status.  GOAL:   Patient will meet greater than or equal to 90% of their needs  MONITOR:   Vent status, Labs, Weight trends, TF tolerance, I & O's  REASON FOR ASSESSMENT:   Consult, Ventilator Enteral/tube feeding initiation and management  ASSESSMENT:   84 year old male with obstructive sleep apnea and ILD admitted on December 29, 2017 with shortness of breath and acute on chronic respiratory failure with hypoxemia due to evidence of community-acquired pneumonia.  Patient is currently intubated on ventilator support MV: 10.5 L/min Temp (24hrs), Avg:99.1 F (37.3 C), Min:96.8 F (36 C), Max:100 F (37.8 C)  Pt in room with wife and staff in room. Per chart review, pt's NGT was clogged last night so did not receive Vital HP beyond 30 ml/hr. Per chart, MD wants trickle feeds started today. Will adjust formula and begin trickles of Vital AF 1.2.  Per chart, pt admitted 12/10. Was placed on a heart healthy diet 12/11, consumed ~75% of dinner meals then was intubated 12/14 d/t acute respiratory failure with hypoxemia. Pt was not eating much for a few days PTA.  Per weight records, pt has lost 16 lb since 10/30 (7% wt loss x 1.5 months, significant for time frame). Suspect pt with some degree of malnutrition but unable to confirm, will need NFPE at some point.  Medications: IV Zofran PRN Labs reviewed: CBGs: 118-135 Elevated Na, Mg GFR: 42  NUTRITION - FOCUSED PHYSICAL EXAM:  Deferred given patient care at the time.  Diet Order:   Diet Order            Diet NPO time specified  Diet effective now              EDUCATION NEEDS:    Not appropriate for education at this time  Skin:  Skin Assessment: Reviewed RN Assessment  Last BM:  12/13  Height:   Ht Readings from Last 1 Encounters:  12/30/17 5' 11" (1.803 m)    Weight:   Wt Readings from Last 1 Encounters:  01/03/18 91.4 kg    Ideal Body Weight:  78.2 kg  BMI:  Body mass index is 28.1 kg/m.  Estimated Nutritional Needs:   Kcal:  1987  Protein:  130-140g  Fluid:  1.9L/day  Clayton Bibles, MS, RD, LDN [place] [facility] Pager: [phone] After Hours Pager: [phone]

## 2018-01-03 NOTE — Progress Notes (Signed)
PULMONARY / CRITICAL CARE MEDICINE   NAME:  Justin Hester, MRN:  [id number], DOB:  1930/06/03, LOS: 5 ADMISSION DATE:  01/12/2018, CONSULTATION DATE:  12/30/2017 REFERRING MD:  Algis Liming, CHIEF COMPLAINT:  Dyspnea  BRIEF HISTORY:    84 year old male with obstructive sleep apnea and ILD admitted on December 29, 2017 with shortness of breath and acute on chronic respiratory failure with hypoxemia due to evidence of community-acquired pneumonia.   SIGNIFICANT PAST MEDICAL HISTORY   Obstructive sleep apnea, pulmonary nodules, DVT, gastroesophageal reflux disease, diabetes mellitus.  SIGNIFICANT EVENTS:  12/10 admission 12/11 move to ICU for close observation  STUDIES:   October 03, 2017 CT angiogram chest showed peripheral interlobular septal thickening, images somewhat blurry but there does appear to be peripheral honeycombing worse in the bases, heterogeneous changes with areas of lung spared, images independently reviewed no pulmonary embolism.  Radiology notes the findings are probable UIP October 30 blood work ANA negative, rheumatoid factor negative November 2019 lung function testing showed total lung capacity of 4.18 L 57% predicted DLCO 14.4 mL 42% predicted December 29, 2017 chest x-ray images independently reviewed showing bilateral airspace disease, some pleural effusion question cephalization December 11/2017 Labs: ANCA negative, Jo-1 negative, SSA/SSB negative, SCL-70 negative  CULTURES:  12/26/2017 blood >> 12/11 RSV > negative 12/11 Flu > negative 12/10 urine strep > negative 12/11 urine legionella > negative  12/14 BAL Bact> 12/14 BAL Fungus> 12/14 BAL AFB>  ANTIBIOTICS:  12/10 ceftriaxone  12/10 azithro  12/10 tamiflu x1 dose  LINES/TUBES:  12/13 BIPAP  CONSULTANTS:  Pulmonary medicine  SUBJECTIVE:  Intubated yesterday, bronch, no acute events otherwise Vent dyssynchrony noted  CONSTITUTIONAL: BP 127/73   Pulse (!) 108   Temp 99.7 F (37.6 C)    Resp 14   Ht _0  (1.803 m)   Wt 91.4 kg   SpO2 92%   BMI 28.10 kg/m   I/O last 3 completed shifts: In: 1526.3 [I.V.:248.1; IV Piggyback:1278.2] Out: 3086 [VHQIO:9629]     PHYSICAL EXAM:  General:  In bed on vent HENT: NCAT ETT in place PULM: Wheezing bilaterally B, vent supported breathing CV: RRR, no mgr GI: BS+, soft, nontender MSK: normal bulk and tone Neuro: sedated on vent     RESOLVED PROBLEM LIST   ASSESSMENT AND PLAN   Acute respiratory failure with hypoxemia with multifocal infiltrates: broad ddx, see discussion on 12/11 note> no compelling micro data to diagnose CAP so I'm more worried about a flare of his underlying ILD; no bleeding noted on BAL yesterday do no evidence of DAH; there are a few case reports in the literature of lung injury after starting Eliquis (J Stroke Cerebrovasc Dis. 2016 Jul;25(7):1767-1769. doi: 10.1016/j.jstrokecerebrovasdis.2016.03.036. Epub 2016 Apr 15.) > Continue antibiotics > increase solumedrol to pulse dose > stop eliquis > continue full vent support > increase sedation for vent synchrony  Nose bleed vs pharyngeal bleeding after NG placement > stop eliquis today, don't restart (see above) > hold heparin until tomorrow  DVT: > start heparin tomorrow if not bleeding  Community-acquired pneumonia > continue ceftiaxone/azithr  Diffuse parenchymal lung disease: IPF flare? Eliquis related > increase solumedrol to pulse dose 12/15-12/18  Hypertension > continue home meds amlodipine, losartan  Hypothyroid > continue synthroid  Acute encephalopathy/ICU delirium > PAD protocol > RASS goal -2  Mild normocytic anemia without bleeding > Monitor for bleeding > Transfuse PRBC for Hgb < 7 gm/dL    SUMMARY OF TODAY'S PLAN:  Read above  Best Practice / Goals of Care /  Disposition.   DVT PROPHYLAXIS: Heparin SUP:n/a NUTRITION: tube feeding MOBILITY:up ad lib GOALS OF CARE:full code FAMILY DISCUSSIONS: updated wife  bedside on 12/15 DISPOSITION remain in ICU  LABS  Glucose Recent Labs  Lab 01/01/18 2357 01/02/18 1315 01/02/18 1535 01/02/18 2009 01/03/18 0009 01/03/18 0335  GLUCAP 118* 154* 138* 158* 132* 135*    BMET Recent Labs  Lab 12/31/17 0521 01/02/18 0234 01/03/18 0326  NA 137 139 146*  K 4.0 4.1 4.7  CL 104 105 113*  CO2 21* 22 21*  BUN 17 55* 68*  CREATININE 1.15 1.61* 1.68*  GLUCOSE 145* 137* 150*    Liver Enzymes Recent Labs  Lab 12/30/2017 1805  AST 20  ALT 24  ALKPHOS 53  BILITOT 1.1  ALBUMIN 3.3*    Electrolytes Recent Labs  Lab 12/31/17 0521 01/02/18 0234 01/02/18 0841 01/02/18 1631 01/03/18 0326  CALCIUM 8.5* 8.4*  --   --  8.4*  MG  --   --  3.1* 2.8* 3.0*  PHOS  --   --  4.4 5.9* 4.2    CBC Recent Labs  Lab 12/31/17 0521 01/02/18 0234 01/03/18 0326  WBC 16.1* 18.6* 16.3*  HGB 13.0 13.1 12.5*  HCT 40.2 40.2 40.2  PLT 225 185 121*    ABG Recent Labs  Lab 01/01/18 1955 01/02/18 0010 01/02/18 1143  PHART 7.465* 7.417 7.352  PCO2ART 31.7* 36.1 43.3  PO2ART 49.0* 125* 80.6*    Coag's No results for input(s): APTT, INR in the last 168 hours.  Sepsis Markers Recent Labs  Lab 12/22/2017 1824 12/31/2017 2148  LATICACIDVEN 1.28 1.09    Cardiac Enzymes No results for input(s): TROPONINI, PROBNP in the last 168 hours.   My cc time 35 minutes  Roselie Awkward, MD [facility] Pager: [phone] Cell: [phone] If no response, call [phone]

## 2018-01-04 ENCOUNTER — Inpatient Hospital Stay (HOSPITAL_COMMUNITY)
Admit: 2018-01-04 | Discharge: 2018-01-04 | Disposition: A | Discharge status: Home / Self Care | Payer: Medicare Other | Attending: Internal Medicine | Admitting: Internal Medicine

## 2018-01-04 DIAGNOSIS — G259 Extrapyramidal and movement disorder, unspecified: Secondary | ICD-10-CM

## 2018-01-04 LAB — GLUCOSE, CAPILLARY
GLUCOSE-CAPILLARY: 161 mg/dL — AB (ref 70–99)
Glucose-Capillary: 142 mg/dL — ABNORMAL HIGH (ref 70–99)
Glucose-Capillary: 189 mg/dL — ABNORMAL HIGH (ref 70–99)
Glucose-Capillary: 185 mg/dL — ABNORMAL HIGH (ref 70–99)
Glucose-Capillary: 170 mg/dL — ABNORMAL HIGH (ref 70–99)
Glucose-Capillary: 154 mg/dL — ABNORMAL HIGH (ref 70–99)
Glucose-Capillary: 161 mg/dL — ABNORMAL HIGH (ref 70–99)
Glucose-Capillary: 172 mg/dL — ABNORMAL HIGH (ref 70–99)
Glucose-Capillary: 235 mg/dL — ABNORMAL HIGH (ref 70–99)

## 2018-01-04 LAB — APTT: aPTT: 30 seconds (ref 24–36)

## 2018-01-04 LAB — BASIC METABOLIC PANEL
Anion gap: 10 (ref 5–15)
BUN: 79 mg/dL — ABNORMAL HIGH (ref 8–23)
CO2: 25 mmol/L (ref 22–32)
CREATININE: 2.07 mg/dL — AB (ref 0.61–1.24)
Calcium: 8.4 mg/dL — ABNORMAL LOW (ref 8.9–10.3)
Chloride: 116 mmol/L — ABNORMAL HIGH (ref 98–111)
GFR calc non Af Amer: 28 mL/min — ABNORMAL LOW (ref >60)
GFR, EST AFRICAN AMERICAN: 33 mL/min — AB (ref >60)
Glucose, Bld: 202 mg/dL — ABNORMAL HIGH (ref 70–99)
Potassium: 5.3 mmol/L — ABNORMAL HIGH (ref 3.5–5.1)
SODIUM: 151 mmol/L — AB (ref 135–145)

## 2018-01-04 LAB — CBC WITH DIFFERENTIAL/PLATELET
Abs Immature Granulocytes: 0.11 10*3/uL — ABNORMAL HIGH (ref 0.00–0.07)
Basophils Absolute: 0 10*3/uL (ref 0.0–0.1)
Basophils Relative: 0 %
EOS PCT: 0 %
Eosinophils Absolute: 0 10*3/uL (ref 0.0–0.5)
HCT: 39.7 % (ref 39.0–52.0)
HEMOGLOBIN: 12 g/dL — AB (ref 13.0–17.0)
Immature Granulocytes: 1 %
Lymphocytes Relative: 2 %
Lymphs Abs: 0.3 10*3/uL — ABNORMAL LOW (ref 0.7–4.0)
MCH: 29.9 pg (ref 26.0–34.0)
MCHC: 30.2 g/dL (ref 30.0–36.0)
MCV: 98.8 fL (ref 80.0–100.0)
Monocytes Absolute: 1.1 10*3/uL — ABNORMAL HIGH (ref 0.1–1.0)
Monocytes Relative: 6 %
Neutro Abs: 16 10*3/uL — ABNORMAL HIGH (ref 1.7–7.7)
Neutrophils Relative %: 91 %
Platelets: 104 10*3/uL — ABNORMAL LOW (ref 150–400)
RBC: 4.02 MIL/uL — ABNORMAL LOW (ref 4.22–5.81)
RDW: 13.2 % (ref 11.5–15.5)
WBC: 17.5 10*3/uL — ABNORMAL HIGH (ref 4.0–10.5)
nRBC: 0 % (ref 0.0–0.2)

## 2018-01-04 LAB — CULTURE, BLOOD (ROUTINE X 2)
Culture: NO GROWTH
Culture: NO GROWTH
Special Requests: ADEQUATE
Special Requests: ADEQUATE

## 2018-01-04 LAB — HEPARIN LEVEL (UNFRACTIONATED): Heparin Unfractionated: 1.07 IU/mL — ABNORMAL HIGH (ref 0.30–0.70)

## 2018-01-04 MED ORDER — AZITHROMYCIN 200 MG/5ML PO SUSR
500.0000 mg | Freq: Every day | ORAL | Status: AC
  Administered 2018-01-04 – 2018-01-05 (×2): 500 mg
  Filled 2018-01-04 (×2): qty 12.5

## 2018-01-04 MED ORDER — RANITIDINE HCL 150 MG/10ML PO SYRP
150.0000 mg | ORAL_SOLUTION | Freq: Every day | ORAL | Status: DC
  Administered 2018-01-04 – 2018-01-07 (×4): 150 mg
  Filled 2018-01-04 (×5): qty 10

## 2018-01-04 NOTE — Progress Notes (Signed)
PHARMACIST - PHYSICIAN COMMUNICATION  CONCERNING: Antibiotic IV to Oral Route Change Policy  RECOMMENDATION: This patient is receiving azithromycin by the intravenous route.  Based on criteria approved by the Pharmacy and Therapeutics Committee, the antibiotic(s) is/are being converted to the equivalent oral dose form(s).   DESCRIPTION: These criteria include:  Patient being treated for a respiratory tract infection, urinary tract infection, cellulitis or clostridium difficile associated diarrhea if on metronidazole  The patient is not neutropenic and does not exhibit a GI malabsorption state  The patient is eating (either orally or via tube) and/or has been taking other orally administered medications for a least 24 hours  The patient is improving clinically and has a Tmax < 100.5  If you have questions about this conversion, please contact the Pharmacy Department  _0   [phone] )  [facility] _1   [phone] )  [facility] _2   [phone] )  [facility] _3   [phone] )  [facility]   _[phone] )  [facility]   Also IV pepcid>> zantac per tube  Eudelia Bunch, Pharm.D [phone] 01/04/2018 8:32 AM

## 2018-01-04 NOTE — Progress Notes (Signed)
PULMONARY / CRITICAL CARE MEDICINE   NAME:  Justin Hester, MRN:  [id number], DOB:  October 14, 1930, LOS: 6 ADMISSION DATE:  01/19/2018, CONSULTATION DATE:  12/30/2017 REFERRING MD:  Algis Liming, CHIEF COMPLAINT:  Dyspnea  BRIEF HISTORY:    84 year old male with obstructive sleep apnea and ILD admitted on December 29, 2017 with shortness of breath and acute on chronic respiratory failure with hypoxemia due to evidence of community-acquired pneumonia.   SIGNIFICANT PAST MEDICAL HISTORY   Obstructive sleep apnea, pulmonary nodules, recurrent DVT, gastroesophageal reflux disease, diabetes mellitus.  SIGNIFICANT EVENTS:  12/10 admission 12/11 move to ICU for close observation 12/15 twitching 12/17 EEG being performed  STUDIES:   October 03, 2017 CT angiogram chest showed peripheral interlobular septal thickening, images somewhat blurry but there does appear to be peripheral honeycombing worse in the bases, heterogeneous changes with areas of lung spared, images independently reviewed no pulmonary embolism.  Radiology notes the findings are probable UIP October 30 blood work ANA negative, rheumatoid factor negative November 2019 lung function testing showed total lung capacity of 4.18 L 57% predicted DLCO 14.4 mL 42% predicted December 29, 2017 chest x-ray images independently reviewed showing bilateral airspace disease, some pleural effusion question cephalization December 11/2017 Labs: ANCA negative, Jo-1 negative, SSA/SSB negative, SCL-70 negative  CULTURES:  12/31/2017 blood >> 12/11 RSV > negative 12/11 Flu > negative 12/10 urine strep > negative 12/11 urine legionella > negative  12/14 BAL Bact> 12/14 BAL Fungus> 12/14 BAL AFB>  ANTIBIOTICS:  12/10 ceftriaxone  12/10 azithro  12/10 tamiflu x1 dose  LINES/TUBES:  12/13 BIPAP  CONSULTANTS:  Pulmonary medicine  SUBJECTIVE:  Intubated yesterday, bronch, no acute events otherwise Vent dyssynchrony noted  CONSTITUTIONAL: BP  135/64   Pulse 91   Temp 98.8 F (37.1 C)   Resp 14   Ht _0  (1.803 m)   Wt 88.8 kg   SpO2 92%   BMI 27.30 kg/m   I/O last 3 completed shifts: In: 2394.4 [I.V.:1014.2; NG/GT:481.8; IV Piggyback:898.4] Out: 2425 [Urine:2425]  PHYSICAL EXAM:  General: Resting in bed, not agitated HENT: NCAT ETT in place PULM: Clear breath sounds anteriorly CV: S1-S2 appreciated GI: BS+, soft, nontender MSK: normal bulk and tone Neuro: sedated on vent   RESOLVED PROBLEM LIST   ASSESSMENT AND PLAN   Acute respiratory failure with hypoxemia with multifocal infiltrates: broad ddx, see discussion on 12/11 note> no compelling micro data to diagnose CAP so I'm more worried about a flare of his underlying ILD; no bleeding noted on BAL yesterday do no evidence of DAH; there are a few case reports in the literature of lung injury after starting Eliquis (J Stroke Cerebrovasc Dis. 2016 Jul;25(7):1767-1769. doi: 10.1016/j.jstrokecerebrovasdis.2016.03.036. Epub 2016 Apr 15.) > Continue antibiotics > increase solumedrol to pulse dose-on 250 Solu-Medrol every 6 > stopped eliquis > continue full vent support > increase sedation for vent synchrony  Nose bleed vs pharyngeal bleeding after NG placement > stop eliquis today, don't restart (see above) > hold heparin at present ~no evidence of bleeding, some evidence of bleeding still present today  DVT: > will hold hepain today >Reevaluate in AM  Community-acquired pneumonia > continue azithromycin  Diffuse parenchymal lung disease: IPF flare? Eliquis related > increase solumedrol to pulse dose 12/15-12/18 >day 2 of pulse dosing  Hypertension > continue home meds amlodipine > losartan held  Hypothyroid > continue synthroid  Acute encephalopathy/ICU delirium > PAD protocol > RASS goal -2  Mild normocytic anemia without bleeding > Monitor for bleeding > Transfuse PRBC  for Hgb < 7 gm/dL   SUMMARY OF TODAY'S PLAN:   Losartan held Will hold  on heparin till 12/17 Continue steroids  Best Practice / Goals of Care / Disposition.   DVT PROPHYLAXIS: Heparin will be started once no evidence of bleeding SUP:n/a NUTRITION: tube feeding MOBILITY:up ad lib GOALS OF CARE:full code FAMILY DISCUSSIONS: updated wife bedside on 12/16 DISPOSITION remain in ICU  LABS  Glucose Recent Labs  Lab 01/03/18 1155 01/03/18 1534 01/03/18 1936 01/03/18 2321 01/04/18 0314 01/04/18 0725  GLUCAP 143* 142* 161* 189* 185* 170*    BMET Recent Labs  Lab 01/02/18 0234 01/03/18 0326 01/04/18 0328  NA 139 146* 151*  K 4.1 4.7 5.3*  CL 105 113* 116*  CO2 22 21* 25  BUN 55* 68* 79*  CREATININE 1.61* 1.68* 2.07*  GLUCOSE 137* 150* 202*    Liver Enzymes Recent Labs  Lab 01/01/2018 1805  AST 20  ALT 24  ALKPHOS 53  BILITOT 1.1  ALBUMIN 3.3*    Electrolytes Recent Labs  Lab 01/02/18 0234  01/02/18 1631 01/03/18 0326 01/03/18 1638 01/04/18 0328  CALCIUM 8.4*  --   --  8.4*  --  8.4*  MG  --    < > 2.8* 3.0* 3.4*  --   PHOS  --    < > 5.9* 4.2 4.5  --    < > = values in this interval not displayed.    CBC Recent Labs  Lab 01/02/18 0234 01/03/18 0326 01/04/18 0328  WBC 18.6* 16.3* 17.5*  HGB 13.1 12.5* 12.0*  HCT 40.2 40.2 39.7  PLT 185 121* 104*    ABG Recent Labs  Lab 01/01/18 1955 01/02/18 0010 01/02/18 1143  PHART 7.465* 7.417 7.352  PCO2ART 31.7* 36.1 43.3  PO2ART 49.0* 125* 80.6*    Coag's Recent Labs  Lab 01/04/18 0328  APTT 30    Sepsis Markers Recent Labs  Lab 01/11/2018 1824 12/28/2017 2148  LATICACIDVEN 1.28 1.09    Cardiac Enzymes No results for input(s): TROPONINI, PROBNP in the last 168 hours.   My cc time 30 minutes  Sherrilyn Rist, MD [facility] Cell: [phone]

## 2018-01-04 NOTE — Progress Notes (Signed)
EEG completed; results pending.

## 2018-01-04 NOTE — Progress Notes (Signed)
Pt.'s CBG was 235.  Provider notified per order.  Awaiting orders. Will continue to monitor.  Mariann Laster, RN

## 2018-01-04 NOTE — Procedures (Signed)
History: 84 year old male being evaluated for encephalopathy and twitching.  Sedation: None  Technique: This is a 21 channel routine scalp EEG performed at the bedside with bipolar and monopolar montages arranged in accordance to the international 10/20 system of electrode placement. One channel was dedicated to EKG recording.    Background: The background consists predominantly of generalized irregular delta and theta activities, but there is occasionally a poorly formed, poorly sustained posterior dominant rhythm of 7 to 8 Hz that is seen at times.  Photic stimulation: Physiologic driving is not performed  EEG Abnormalities: 1) generalized irregular slow activity  Clinical Interpretation: This EEG is most consistent with a generalized nonspecific cerebral dysfunction (encephalopathy).    There was no EEG correlate to the left-sided twitching described by the technologist.  This EEG does not support an epileptic nature of these movements, however simple partial seizures can occasionally be surface EEG negative.  There was no electrographic seizure or evidence of seizure predisposition recorded on this study.   Justin Rack, MD [facility] [phone]  If 7pm- 7am, please page neurology on call as listed in [place].

## 2018-01-04 NOTE — Progress Notes (Signed)
[place] for IV heparin Indication: Hx recurrent DVT on Eliquis PTA  Allergies  Allergen Reactions  . Cyclobenzaprine     Rash, chills, shaking  . Oxycodone-Acetaminophen     REACTION: unspecified  . Propoxyphene N-Acetaminophen     REACTION: itching---hot and cold flashes    Patient Measurements: Height: 5' 11" (180.3 cm) Weight: 195 lb 12.3 oz (88.8 kg) IBW/kg (Calculated) : 75.3 Heparin Dosing Weight: TBW  Vital Signs: Temp: 98.8 F (37.1 C) (12/16 0800) Temp Source: Bladder (12/16 0000) BP: 135/64 (12/16 0800) Pulse Rate: 91 (12/16 0800)  Labs: Recent Labs    01/02/18 0234 01/03/18 0326 01/04/18 0328  HGB 13.1 12.5* 12.0*  HCT 40.2 40.2 39.7  PLT 185 121* 104*  APTT  --   --  30  HEPARINUNFRC  --   --  1.07*  CREATININE 1.61* 1.68* 2.07*    Estimated Creatinine Clearance: 27.8 mL/min (A) (by C-G formula based on SCr of 2.07 mg/dL (H)).   Assessment: 21 yoM on Eliquis for Hx recurrent DVT, admitted for acute respiratory failure d/t CAP vs ILD flare. Intubated 12/14 and bleeding noted after NGT placement. MD also concerned for possible Eliquis-induced lung injury (see CCM note), so would like to hold Eliquis and transition patient to IV heparin tomorrow   Baseline HL 12/16 am 1.07 elevated due to eliquis and AKI (LD Eliquis 12/14 PM)  Baseline aPTT 12/16 = 30 secs  Prior anticoagulation: Eliquis 5 mg PO bid; LD 12/14 at 2100 (missed AM dose)  Significant events:  Today, 01/04/2018:  CBC: Hgb low but overall stable this admission; Plt lower today at 104; < 50% of admission level but no recent heparin exposure so no concern for HIT  12/15 CCM MD noted nosebleed vs pharyngeal bleeding after NG placement - asking to start heparin tomorrow if no further bleeding  12/16 RN reports pt still having some subglottic bleeding and some foley hematuria  Goal of Therapy: Heparin level 0.3-0.7 units/ml Monitor platelets by  anticoagulation protocol: Yes  Plan:  Await assessment by CCM, anticipate holding another day with continued NG bleeding and thrombocytopenia   if no bleeding would start heparin at 1300 units/hr  Daily CBC while on heparin, check aPTT while HL elevated from Eliquis  Monitor for s/s bleeding  Eudelia Bunch, Pharm.D [phone] 01/04/2018 8:57 AM

## 2018-01-04 NOTE — Progress Notes (Signed)
  Progress Note   Date: 01/04/2018  Patient Name: Justin Hester        MRN#: [id number]   Clarification of diagnosis:  Encephalopathy due to hypoxia   Londan Coplen Clearence Ped, MD

## 2018-01-04 NOTE — Progress Notes (Signed)
2 RT's present- ETT holder changed - uneventful. ETT resides at 24cm lip, receiving full VT, BBS.

## 2018-01-05 ENCOUNTER — Inpatient Hospital Stay (HOSPITAL_COMMUNITY): Payer: Medicare Other

## 2018-01-05 DIAGNOSIS — J9312 Secondary spontaneous pneumothorax: Secondary | ICD-10-CM

## 2018-01-05 LAB — GLUCOSE, CAPILLARY
GLUCOSE-CAPILLARY: 152 mg/dL — AB (ref 70–99)
GLUCOSE-CAPILLARY: 140 mg/dL — AB (ref 70–99)
Glucose-Capillary: 148 mg/dL — ABNORMAL HIGH (ref 70–99)
Glucose-Capillary: 161 mg/dL — ABNORMAL HIGH (ref 70–99)
Glucose-Capillary: 165 mg/dL — ABNORMAL HIGH (ref 70–99)
Glucose-Capillary: 176 mg/dL — ABNORMAL HIGH (ref 70–99)

## 2018-01-05 LAB — CBC WITH DIFFERENTIAL/PLATELET
Abs Immature Granulocytes: 0.2 10*3/uL — ABNORMAL HIGH (ref 0.00–0.07)
Basophils Absolute: 0 10*3/uL (ref 0.0–0.1)
Basophils Relative: 0 %
EOS PCT: 0 %
Eosinophils Absolute: 0 10*3/uL (ref 0.0–0.5)
HEMATOCRIT: 40.6 % (ref 39.0–52.0)
HEMOGLOBIN: 12 g/dL — AB (ref 13.0–17.0)
Immature Granulocytes: 1 %
Lymphocytes Relative: 2 %
Lymphs Abs: 0.3 10*3/uL — ABNORMAL LOW (ref 0.7–4.0)
MCH: 29.8 pg (ref 26.0–34.0)
MCHC: 29.6 g/dL — AB (ref 30.0–36.0)
MCV: 100.7 fL — ABNORMAL HIGH (ref 80.0–100.0)
Monocytes Absolute: 1.1 10*3/uL — ABNORMAL HIGH (ref 0.1–1.0)
Monocytes Relative: 6 %
Neutro Abs: 17.3 10*3/uL — ABNORMAL HIGH (ref 1.7–7.7)
Neutrophils Relative %: 91 %
Platelets: 88 10*3/uL — ABNORMAL LOW (ref 150–400)
RBC: 4.03 MIL/uL — ABNORMAL LOW (ref 4.22–5.81)
RDW: 13.4 % (ref 11.5–15.5)
WBC: 19 10*3/uL — ABNORMAL HIGH (ref 4.0–10.5)
nRBC: 0 % (ref 0.0–0.2)

## 2018-01-05 LAB — PROTIME-INR
INR: 1.37
Prothrombin Time: 16.7 seconds — ABNORMAL HIGH (ref 11.4–15.2)

## 2018-01-05 LAB — HEPARIN LEVEL (UNFRACTIONATED): Heparin Unfractionated: 0.96 IU/mL — ABNORMAL HIGH (ref 0.30–0.70)

## 2018-01-05 LAB — CULTURE, RESPIRATORY W GRAM STAIN
Culture: NORMAL
Gram Stain: NONE SEEN

## 2018-01-05 NOTE — Progress Notes (Signed)
PCXR reviewed at bedside.  Pneumothorax on right.  Small window / ~ 1-2cm from from chest wall at the largest point.  Mediastinal air, subcutaneous air as well.   PEEP turned down to 8.  IR consulted for CT guided chest tube placement.  Large bore needle brought to bedside as well as chest tube kit in the event of hemodynamic change.  Will monitor closely.  Family updated at bedside.   Justin Gens, NP-C [facility] Pgr: [phone] or if no answer [phone] 01/05/2018, 12:45 PM

## 2018-01-05 NOTE — Progress Notes (Addendum)
PULMONARY / CRITICAL CARE MEDICINE   NAME:  Justin Hester, MRN:  [id number], DOB:  03-Jul-1930, LOS: 7 ADMISSION DATE:  01/01/2018, CONSULTATION DATE:  12/30/2017 REFERRING MD:  Algis Liming, CHIEF COMPLAINT:  Dyspnea  BRIEF HISTORY:    84 year old male with obstructive sleep apnea and ILD admitted on December 29, 2017 with shortness of breath and acute on chronic respiratory failure with hypoxemia due to evidence of community-acquired pneumonia.  SIGNIFICANT PAST MEDICAL HISTORY   Obstructive sleep apnea, pulmonary nodules, recurrent DVT, gastroesophageal reflux disease, diabetes mellitus.  SIGNIFICANT EVENTS:  12/10 admission 12/11 move to ICU for close observation 12/13 BIPAP 12/14 Intubated, BAL 12/15 twitching, EEG pending. Vent dyssynchrony noted 12/16 Questionable seizure, EEG neg 12/17 Concern for SQ air on left>right lateral neck  STUDIES:   October 03, 2017 CT angiogram chest showed peripheral interlobular septal thickening, images somewhat blurry but there does appear to be peripheral honeycombing worse in the bases, heterogeneous changes with areas of lung spared, images independently reviewed no pulmonary embolism.  Radiology notes the findings are probable UIP October 30 blood work ANA negative, rheumatoid factor negative November 2019 lung function testing showed total lung capacity of 4.18 L 57% predicted DLCO 14.4 mL 42% predicted December 29, 2017 chest x-ray images independently reviewed showing bilateral airspace disease, some pleural effusion question cephalization December 11/2017 Labs: ANCA negative, Jo-1 negative, SSA/SSB negative, SCL-70 negative  CULTURES:  12/10 BCx2 >> negative 12/11 RSV > negative 12/11 Flu > negative 12/10 urine strep > negative 12/11 urine legionella > negative  12/14 BAL Bact > negative, normal flora  12/14 BAL Fungus > no fungus no smear >> 12/14 BAL AFB >> negative smear >>  ANTIBIOTICS:  12/10 ceftriaxone >> 12/17  12/10  azithro >> 12/17 12/10 tamiflu x1 dose  LINES/TUBES:  ETT 12/14 >>   CONSULTANTS:  Pulmonary medicine  SUBJECTIVE:  RN reports no acute events.  FiO2 55%/PEEP 10.  Remains on fentanyl gtt  CONSTITUTIONAL: BP (!) 139/56   Pulse 96   Temp 99.1 F (37.3 C)   Resp 16   Ht _0  (1.803 m)   Wt 89.5 kg   SpO2 100%   BMI 27.52 kg/m   I/O last 3 completed shifts: In: 2775.4 [I.V.:1379.9; NG/GT:710; IV Piggyback:685.5] Out: 3270 [Urine:3270]  PHYSICAL EXAM: General: elderly male lying in bed on vent in NAD HEENT: MM pink/moist, ETT, swelling noted L>R in neck, palpable emphysema Neuro: sedate CV: s1s2 rrr, no m/r/g PULM: even/non-labored, lungs bilaterally clear anterior, equal breath sounds QM:GQQP, non-tender, bsx4 active  Extremities: warm/dry, trace BLE pitting edema  Skin: no rashes or lesions  RESOLVED PROBLEM LIST   ASSESSMENT AND PLAN    Acute respiratory failure with hypoxemia in setting of multifocal infiltrates -broad ddx, see discussion on 12/11 note> no compelling micro data to diagnose CAP. More worried about a flare of his underlying ILD; no bleeding noted on BAL 12/14, no evidence of DAH; there are a few case reports in the literature of lung injury after starting Eliquis (J Stroke Cerebrovasc Dis. 2016 Jul;25(7):1767-1769. doi: 10.1016/j.jstrokecerebrovasdis.2016.03.036. Epub 2016 Apr 15.) P: D7/7 abx, stop after 12/17 doses completed Continue solumedrol 238m IV Q6  PRVC 8 cc/kg  Wean PEEP/FiO2 for sats >88% Continue sedation for ventilator synchrony   Subcutaneous Air on L>R Neck on exam P: STAT CXR to r/o pneumothorax  Nose bleed vs Pharyngeal bleeding after NG placement P: Do not restart Eliquis  If restarting anticoagulation, consider coumadin  Hold heparin 12/17 with pink tinged secretions,  consider restart 12/18  Hx DVT -right popliteal fossa, dx in 08/2017.  On eliquis prior to admit  P: Heparin as above   Diffuse Bilateral Infiltrates   -doubt CAP but treated 7 days with abx P: ABX completed 12/17 Monitor fever curve / WBC trend off abx.  Note elevated WBC in setting of high dose steroids   Diffuse parenchymal lung disease: IPF flare? Eliquis related P: Solumedrol 243m A6, plan for 12/15-12/18   Hypertension P: Hold home losartan  Continue amlodipine   Hypothyroid P: Continue synthroid   Acute encephalopathy/ICU delirium P: Fentanyl gtt for pain / sedation  PRN versed  RASS Goal: vent synchrony, -2  Mild normocytic anemia without bleeding P: Trend CBC  Transfuse per ICU guidelines    Best Practice / Goals of Care / Disposition.   DVT PROPHYLAXIS: Heparin will be started once no evidence of bleeding SUP: ranitidine  NUTRITION: TF MOBILITY: bedrest  GOALS OF CARE: Full code  FAMILY DISCUSSIONS: Wife, son, daughter updated at bedside  DISPOSITION:  ICU  LABS  Glucose Recent Labs  Lab 01/04/18 1157 01/04/18 1540 01/04/18 1919 01/04/18 2314 01/05/18 0339 01/05/18 0741  GLUCAP 154* 161* 172* 235* 176* 161*    BMET Recent Labs  Lab 01/02/18 0234 01/03/18 0326 01/04/18 0328  NA 139 146* 151*  K 4.1 4.7 5.3*  CL 105 113* 116*  CO2 22 21* 25  BUN 55* 68* 79*  CREATININE 1.61* 1.68* 2.07*  GLUCOSE 137* 150* 202*    Liver Enzymes Recent Labs  Lab 01/18/2018 1805  AST 20  ALT 24  ALKPHOS 53  BILITOT 1.1  ALBUMIN 3.3*    Electrolytes Recent Labs  Lab 01/02/18 0234  01/02/18 1631 01/03/18 0326 01/03/18 1638 01/04/18 0328  CALCIUM 8.4*  --   --  8.4*  --  8.4*  MG  --    < > 2.8* 3.0* 3.4*  --   PHOS  --    < > 5.9* 4.2 4.5  --    < > = values in this interval not displayed.    CBC Recent Labs  Lab 01/03/18 0326 01/04/18 0328 01/05/18 0305  WBC 16.3* 17.5* 19.0*  HGB 12.5* 12.0* 12.0*  HCT 40.2 39.7 40.6  PLT 121* 104* 88*    ABG Recent Labs  Lab 01/01/18 1955 01/02/18 0010 01/02/18 1143  PHART 7.465* 7.417 7.352  PCO2ART 31.7* 36.1 43.3  PO2ART 49.0*  125* 80.6*    Coag's Recent Labs  Lab 01/04/18 0328  APTT 30    Sepsis Markers Recent Labs  Lab 12/25/2017 1824 12/31/2017 2148  LATICACIDVEN 1.28 1.09    Cardiac Enzymes No results for input(s): TROPONINI, PROBNP in the last 168 hours.   BNoe Gens NP-C [facility] Pgr: [phone] or if no answer [phone]/17/2019, 10:55 AM

## 2018-01-06 ENCOUNTER — Inpatient Hospital Stay (HOSPITAL_COMMUNITY): Payer: Medicare Other

## 2018-01-06 DIAGNOSIS — I63332 Cerebral infarction due to thrombosis of left posterior cerebral artery: Secondary | ICD-10-CM

## 2018-01-06 DIAGNOSIS — J939 Pneumothorax, unspecified: Secondary | ICD-10-CM

## 2018-01-06 DIAGNOSIS — Z86718 Personal history of other venous thrombosis and embolism: Secondary | ICD-10-CM

## 2018-01-06 LAB — COMPREHENSIVE METABOLIC PANEL
ALT: 42 U/L (ref 0–44)
AST: 32 U/L (ref 15–41)
Albumin: 2.5 g/dL — ABNORMAL LOW (ref 3.5–5.0)
Alkaline Phosphatase: 57 U/L (ref 38–126)
Anion gap: 8 (ref 5–15)
BUN: 73 mg/dL — ABNORMAL HIGH (ref 8–23)
CO2: 28 mmol/L (ref 22–32)
Calcium: 8.5 mg/dL — ABNORMAL LOW (ref 8.9–10.3)
Chloride: 119 mmol/L — ABNORMAL HIGH (ref 98–111)
Creatinine, Ser: 1.66 mg/dL — ABNORMAL HIGH (ref 0.61–1.24)
GFR calc Af Amer: 43 mL/min — ABNORMAL LOW (ref >60)
GFR calc non Af Amer: 37 mL/min — ABNORMAL LOW (ref >60)
Glucose, Bld: 189 mg/dL — ABNORMAL HIGH (ref 70–99)
Potassium: 5.3 mmol/L — ABNORMAL HIGH (ref 3.5–5.1)
Sodium: 155 mmol/L — ABNORMAL HIGH (ref 135–145)
Total Bilirubin: 0.7 mg/dL (ref 0.3–1.2)
Total Protein: 6.2 g/dL — ABNORMAL LOW (ref 6.5–8.1)

## 2018-01-06 LAB — CBC WITH DIFFERENTIAL/PLATELET
ABS IMMATURE GRANULOCYTES: 0.2 10*3/uL — AB (ref 0.00–0.07)
Basophils Absolute: 0 10*3/uL (ref 0.0–0.1)
Basophils Relative: 0 %
Eosinophils Absolute: 0 10*3/uL (ref 0.0–0.5)
Eosinophils Relative: 0 %
HCT: 37.8 % — ABNORMAL LOW (ref 39.0–52.0)
Hemoglobin: 11.2 g/dL — ABNORMAL LOW (ref 13.0–17.0)
Immature Granulocytes: 1 %
Lymphocytes Relative: 2 %
Lymphs Abs: 0.3 10*3/uL — ABNORMAL LOW (ref 0.7–4.0)
MCH: 30.4 pg (ref 26.0–34.0)
MCHC: 29.6 g/dL — ABNORMAL LOW (ref 30.0–36.0)
MCV: 102.4 fL — ABNORMAL HIGH (ref 80.0–100.0)
Monocytes Absolute: 0.9 10*3/uL (ref 0.1–1.0)
Monocytes Relative: 5 %
Neutro Abs: 17.9 10*3/uL — ABNORMAL HIGH (ref 1.7–7.7)
Neutrophils Relative %: 92 %
PLATELETS: 88 10*3/uL — AB (ref 150–400)
RBC: 3.69 MIL/uL — ABNORMAL LOW (ref 4.22–5.81)
RDW: 13.6 % (ref 11.5–15.5)
WBC: 19.3 10*3/uL — ABNORMAL HIGH (ref 4.0–10.5)
nRBC: 0.2 % (ref 0.0–0.2)

## 2018-01-06 LAB — BASIC METABOLIC PANEL
Anion gap: 5 (ref 5–15)
BUN: 74 mg/dL — ABNORMAL HIGH (ref 8–23)
CO2: 27 mmol/L (ref 22–32)
Calcium: 8.3 mg/dL — ABNORMAL LOW (ref 8.9–10.3)
Chloride: 120 mmol/L — ABNORMAL HIGH (ref 98–111)
Creatinine, Ser: 1.66 mg/dL — ABNORMAL HIGH (ref 0.61–1.24)
GFR calc non Af Amer: 37 mL/min — ABNORMAL LOW (ref >60)
GFR, EST AFRICAN AMERICAN: 43 mL/min — AB (ref >60)
Glucose, Bld: 240 mg/dL — ABNORMAL HIGH (ref 70–99)
Potassium: 5.1 mmol/L (ref 3.5–5.1)
Sodium: 152 mmol/L — ABNORMAL HIGH (ref 135–145)

## 2018-01-06 LAB — GLUCOSE, CAPILLARY
GLUCOSE-CAPILLARY: 173 mg/dL — AB (ref 70–99)
Glucose-Capillary: 162 mg/dL — ABNORMAL HIGH (ref 70–99)
Glucose-Capillary: 140 mg/dL — ABNORMAL HIGH (ref 70–99)
Glucose-Capillary: 164 mg/dL — ABNORMAL HIGH (ref 70–99)
Glucose-Capillary: 179 mg/dL — ABNORMAL HIGH (ref 70–99)

## 2018-01-06 LAB — BLOOD GAS, ARTERIAL
Acid-Base Excess: 2.8 mmol/L — ABNORMAL HIGH (ref 0.0–2.0)
Acid-Base Excess: 3.5 mmol/L — ABNORMAL HIGH (ref 0.0–2.0)
Bicarbonate: 28.9 mmol/L — ABNORMAL HIGH (ref 20.0–28.0)
Bicarbonate: 29.4 mmol/L — ABNORMAL HIGH (ref 20.0–28.0)
Drawn by: 270211
Drawn by: 441261
FIO2: 0.4
FIO2: 45
MECHVT: 600 mL
O2 Saturation: 73.8 %
O2 Saturation: 79.8 %
PCO2 ART: 55.2 mmHg — AB (ref 32.0–48.0)
PEEP: 8 cmH2O
PEEP: 8 cmH2O
Patient temperature: 99
Patient temperature: 37.6
Pressure support: 10 cmH2O
RATE: 16 resp/min
pCO2 arterial: 55.2 mmHg — ABNORMAL HIGH (ref 32.0–48.0)
pH, Arterial: 7.341 — ABNORMAL LOW (ref 7.350–7.450)
pH, Arterial: 7.349 — ABNORMAL LOW (ref 7.350–7.450)
pO2, Arterial: 43.9 mmHg — ABNORMAL LOW (ref 83.0–108.0)
pO2, Arterial: 49.1 mmHg — ABNORMAL LOW (ref 83.0–108.0)

## 2018-01-06 MED ORDER — FREE WATER
200.0000 mL | Freq: Four times a day (QID) | Status: DC
  Administered 2018-01-06 – 2018-01-07 (×4): 200 mL

## 2018-01-06 MED ORDER — FREE WATER
200.0000 mL | Freq: Three times a day (TID) | Status: DC
  Administered 2018-01-06: 200 mL

## 2018-01-06 MED ORDER — MIDAZOLAM HCL 2 MG/2ML IJ SOLN
2.0000 mg | Freq: Once | INTRAMUSCULAR | Status: AC
  Administered 2018-01-06: 4 mg via INTRAVENOUS
  Filled 2018-01-06: qty 4

## 2018-01-06 MED ORDER — DEXTROSE 5 % IV SOLN
INTRAVENOUS | Status: DC
  Administered 2018-01-06: 06:00:00 via INTRAVENOUS

## 2018-01-06 MED ORDER — ASPIRIN 325 MG PO TABS
325.0000 mg | ORAL_TABLET | Freq: Every day | ORAL | Status: DC
  Administered 2018-01-06 – 2018-01-07 (×2): 325 mg
  Filled 2018-01-06 (×2): qty 1

## 2018-01-06 NOTE — Progress Notes (Signed)
Day shift updates- During WUA  this morning, it was noted that patient moved his  LUE & LLE to painful stimuli but no motor response elicited in the Rt. upper & lower extremities (compared to 12/17-when pt showed motor response in all 4 extremities when agitated). CT scan of the head and chest was ordered. CT chest revealed a large new  left pneumothorax and stable Rt pneumothorax (from yesterday). 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 PCCM placed a left sided chest tube, now placed to suction. Will continue to monitor hemodynamics in regards to any changes with the Rt sided pneumothorax.  Nursing goal at present is to keep patient comfortable and synchronous with the vent but at the same time try to minimize sedation if possible in order to evaluate neuro status. Will continue to monitor.

## 2018-01-06 NOTE — Progress Notes (Signed)
Patient transported to CT on 100% FiO2.No complications noted during transport or procedure. RT will continue to monitor patient.

## 2018-01-06 NOTE — Progress Notes (Signed)
PULMONARY / CRITICAL CARE MEDICINE   NAME:  Justin Hester, MRN:  [id number], DOB:  [birth year], LOS: 8 ADMISSION DATE:  12/21/2017, CONSULTATION DATE:  12/30/2017 REFERRING MD:  Algis Liming, CHIEF COMPLAINT:  Dyspnea  BRIEF HISTORY:    84 year old male with obstructive sleep apnea and ILD admitted on December 29, 2017 with shortness of breath and acute on chronic respiratory failure with hypoxemia due to evidence of community-acquired pneumonia.  SIGNIFICANT PAST MEDICAL HISTORY   Obstructive sleep apnea, pulmonary nodules, recurrent DVT, gastroesophageal reflux disease, diabetes mellitus.  SIGNIFICANT EVENTS:  12/10 admission 12/11 move to ICU for close observation 12/13 BIPAP 12/14 Intubated, BAL 12/15 twitching, EEG pending. Vent dyssynchrony noted 12/16 Questionable seizure, EEG neg 12/17 Concern for SQ air on left>right lateral neck, PTX on CXR with mediastinal air   STUDIES:   October 03, 2017 CT angiogram chest showed peripheral interlobular septal thickening, images somewhat blurry but there does appear to be peripheral honeycombing worse in the bases, heterogeneous changes with areas of lung spared, images independently reviewed no pulmonary embolism.  Radiology notes the findings are probable UIP October 30 blood work ANA negative, rheumatoid factor negative November 2019 lung function testing showed total lung capacity of 4.18 L 57% predicted DLCO 14.4 mL 42% predicted December 29, 2017 chest x-ray images independently reviewed showing bilateral airspace disease, some pleural effusion question cephalization December 11/2017 Labs: ANCA negative, Jo-1 negative, SSA/SSB negative, SCL-70 negative  CULTURES:  12/10 BCx2 >> negative 12/11 RSV > negative 12/11 Flu > negative 12/10 urine strep > negative 12/11 urine legionella > negative  12/14 BAL Bact > negative, normal flora  12/14 BAL Fungus > no fungus no smear >> 12/14 BAL AFB >> negative smear >>  ANTIBIOTICS:  12/10  ceftriaxone >> 12/17  12/10 azithro >> 12/17 12/10 tamiflu x1 dose  LINES/TUBES:  ETT 12/14 >>   CONSULTANTS:  Pulmonary medicine  SUBJECTIVE:  RT reports pt remains on 8 PEEP/40% fiO2.  Concern for decreased movement on right side on WUA.  Desaturates to 86% with WUA.   CONSTITUTIONAL: BP (!) 127/53   Pulse 95   Temp 99.7 F (37.6 C)   Resp 20   Ht _0  (1.803 m)   Wt 89.5 kg   SpO2 92%   BMI 27.52 kg/m   I/O last 3 completed shifts: In: 2600.3 [I.V.:1334.7; NG/GT:770; IV Piggyback:495.6] Out: 6659 [Urine:3265]  PHYSICAL EXAM: General: adult male lying in bed on vent HEENT: MM pink/moist, ETT Neuro: upward gaze on WUA, spontaneous movement on left, no follow commands, no movement noted on right CV: s1s2 rrr, no m/r/g PULM: even/non-labored, lungs bilaterally clear  DJ:TTSV, non-tender, bsx4 active  Extremities: warm/dry, trace LE edema  Skin: no rashes or lesions  RESOLVED PROBLEM LIST   ASSESSMENT AND PLAN    Acute respiratory failure with hypoxemia in setting of multifocal infiltrates -broad ddx, see discussion on 12/11 note> no compelling micro data to diagnose CAP. More worried about a flare of his underlying ILD; no bleeding noted on BAL 12/14, no evidence of DAH; there are a few case reports in the literature of lung injury after starting Eliquis (J Stroke Cerebrovasc Dis. 2016 Jul;25(7):1767-1769. doi: 10.1016/j.jstrokecerebrovasdis.2016.03.036. Epub 2016 Apr 15.) P: Monitor off abx  Solumedrol 250 mg IV Q6 PRVC 8cc/kg  Wean PEEP/FiO2 for sats > 90% Sedation for ventilator synchrony    Subcutaneous Air on L>R Neck on exam P: IR consulted for chest tube   Nose bleed vs Pharyngeal bleeding after NG placement P: Do  not restart Eliquis (see above) Heparin gtt per pharmacy once chest tube is sorted out   Hx DVT -right popliteal fossa, dx in 08/2017.  On eliquis prior to admit  P: Heparin as above, hold for now pending decision regarding  procedure  Diffuse Bilateral Infiltrates  -doubt CAP but treated 7 days with abx P: Follow CXR  Steroids as above   Diffuse parenchymal lung disease: IPF flare? Eliquis related P: Solumedrol 252m IV Q6, plan for 12/15-12/18  Hypertension P: Hold home losartan  Continue amlodipine  Hypothyroid P: Continue synthroid    Hypernatremia  P: Free water 2014mQ6  D5w 500 ml  Acute Metabolic Encephalopathy/ICU delirium -hypernatremia, hypoxia, medications P: Fentanyl gtt for pain / sedation  PRN versed  RASS Goal: -2 Assess CT head 12/18 with decreased movement noted on WUA  2-4 mg Versed PRN for CT travel   Mild normocytic anemia without bleeding P: Trend CBC  Transfuse per ICU guidelines   Best Practice / Goals of Care / Disposition.   DVT PROPHYLAXIS: Heparin will be started once no evidence of bleeding, CT sorted out  SUP: ranitidine  NUTRITION: TF MOBILITY: bedrest  GOALS OF CARE: Full code  FAMILY DISCUSSIONS: Family updated at bedside am 12/18 DISPOSITION:  ICU  LABS  Glucose Recent Labs  Lab 01/05/18 1209 01/05/18 1546 01/05/18 1937 01/05/18 2334 01/06/18 0331 01/06/18 0808  GLUCAP 152* 140* 148* 165* 162* 173*    BMET Recent Labs  Lab 01/03/18 0326 01/04/18 0328 01/06/18 0253  NA 146* 151* 155*  K 4.7 5.3* 5.3*  CL 113* 116* 119*  CO2 21* 25 28  BUN 68* 79* 73*  CREATININE 1.68* 2.07* 1.66*  GLUCOSE 150* 202* 189*    Liver Enzymes Recent Labs  Lab 01/06/18 0253  AST 32  ALT 42  ALKPHOS 57  BILITOT 0.7  ALBUMIN 2.5*    Electrolytes Recent Labs  Lab 01/02/18 1631 01/03/18 0326 01/03/18 1638 01/04/18 0328 01/06/18 0253  CALCIUM  --  8.4*  --  8.4* 8.5*  MG 2.8* 3.0* 3.4*  --   --   PHOS 5.9* 4.2 4.5  --   --     CBC Recent Labs  Lab 01/04/18 0328 01/05/18 0305 01/06/18 0253  WBC 17.5* 19.0* 19.3*  HGB 12.0* 12.0* 11.2*  HCT 39.7 40.6 37.8*  PLT 104* 88* 88*    ABG Recent Labs  Lab 01/01/18 1955  01/02/18 0010 01/02/18 1143  PHART 7.465* 7.417 7.352  PCO2ART 31.7* 36.1 43.3  PO2ART 49.0* 125* 80.6*    Coag's Recent Labs  Lab 01/04/18 0328 01/05/18 1342  APTT 30  --   INR  --  1.37    Sepsis Markers No results for input(s): LATICACIDVEN, PROCALCITON, O2SATVEN in the last 168 hours.  Cardiac Enzymes No results for input(s): TROPONINI, PROBNP in the last 168 hours.  CC Time: 3279inutes   BrNoe GensNP-C [facility] Pgr: [phone] or if no answer [phone]/18/2019, 8:12 AM

## 2018-01-06 NOTE — Progress Notes (Signed)
Care taken over by this writer in MRI at change of shift.  Pt transported from MRI back to ICU 1231 on vent 100% fio2.  Pt tolerated transport well without incident.  Pt placed back on 60% fio2 per previous vent settings.

## 2018-01-06 NOTE — Progress Notes (Signed)
eLink Physician-Brief Progress Note Patient Name: Justin Hester DOB: Jun 09, 1930 MRN: [id number]   Date of Service  01/06/2018  HPI/Events of Note  Request of family to discuss about MRI findings. Patient reportedly had an active lifestyle prior to this admission.  eICU Interventions  Informed wife that MRI confirms presence of infarct.  Questions about recovery and quality of life was brought up.  Explained to her that patient will need  rehab and the possibility of trach which might actually facilitate liberation from the ventilator.     Intervention Category Minor Interventions: Communication with other healthcare providers and/or family  Judd Lien 01/06/2018, 9:14 PM

## 2018-01-06 NOTE — Procedures (Signed)
Chest Tube Insertion Procedure Note  Indications:  Clinically significant Pneumothorax  Pre-operative Diagnosis: Pneumothorax  Post-operative Diagnosis: Pneumothorax  Procedure Details  Informed consent was obtained for the procedure, including sedation.  Risks of lung perforation, hemorrhage, arrhythmia, and adverse drug reaction were discussed.   After sterile skin prep, using standard technique, a 14 French tube was placed in the left lateral 5 rib space.  Findings: Gush of air noted  Estimated Blood Loss:  Minimal         Specimens:  None              Complications:  None; patient tolerated the procedure well.         Disposition: ICU - intubated and hemodynamically stable.         Condition: stable  Attending Attestation: I performed the procedure.

## 2018-01-06 NOTE — Progress Notes (Signed)
Carotid duplex has been completed.   Preliminary results in CV Proc.   Abram Sander 01/06/2018 3:42 PM

## 2018-01-06 NOTE — Progress Notes (Signed)
Spoke with Neurology regarding CT of head, consult requested.  Neurology recommended the following:  MRI head, ASA if able, carotid doppler and repeat ECHO to evaluate for PFO.  Family updated extensively per Dr. Ander Slade at bedside.    Noe Gens, NP-C [facility] Pgr: [phone] or if no answer [phone] 01/06/2018, 3:18 PM

## 2018-01-07 ENCOUNTER — Inpatient Hospital Stay (HOSPITAL_COMMUNITY): Payer: Medicare Other

## 2018-01-07 DIAGNOSIS — M7989 Other specified soft tissue disorders: Secondary | ICD-10-CM

## 2018-01-07 DIAGNOSIS — J9601 Acute respiratory failure with hypoxia: Secondary | ICD-10-CM

## 2018-01-07 DIAGNOSIS — I6310 Cerebral infarction due to embolism of unspecified precerebral artery: Secondary | ICD-10-CM

## 2018-01-07 DIAGNOSIS — J982 Interstitial emphysema: Secondary | ICD-10-CM

## 2018-01-07 DIAGNOSIS — Z86718 Personal history of other venous thrombosis and embolism: Secondary | ICD-10-CM

## 2018-01-07 LAB — CBC WITH DIFFERENTIAL/PLATELET
Abs Immature Granulocytes: 0.14 10*3/uL — ABNORMAL HIGH (ref 0.00–0.07)
Basophils Absolute: 0 10*3/uL (ref 0.0–0.1)
Basophils Relative: 0 %
Eosinophils Absolute: 0 10*3/uL (ref 0.0–0.5)
Eosinophils Relative: 0 %
HCT: 37.8 % — ABNORMAL LOW (ref 39.0–52.0)
HEMOGLOBIN: 11.2 g/dL — AB (ref 13.0–17.0)
Immature Granulocytes: 1 %
LYMPHS PCT: 1 %
Lymphs Abs: 0.2 10*3/uL — ABNORMAL LOW (ref 0.7–4.0)
MCH: 29.8 pg (ref 26.0–34.0)
MCHC: 29.6 g/dL — ABNORMAL LOW (ref 30.0–36.0)
MCV: 100.5 fL — ABNORMAL HIGH (ref 80.0–100.0)
Monocytes Absolute: 0.6 10*3/uL (ref 0.1–1.0)
Monocytes Relative: 4 %
Neutro Abs: 15.6 10*3/uL — ABNORMAL HIGH (ref 1.7–7.7)
Neutrophils Relative %: 94 %
Platelets: 80 10*3/uL — ABNORMAL LOW (ref 150–400)
RBC: 3.76 MIL/uL — ABNORMAL LOW (ref 4.22–5.81)
RDW: 13.7 % (ref 11.5–15.5)
WBC: 16.6 10*3/uL — ABNORMAL HIGH (ref 4.0–10.5)
nRBC: 0.1 % (ref 0.0–0.2)

## 2018-01-07 LAB — BLOOD GAS, ARTERIAL
Acid-Base Excess: 3.1 mmol/L — ABNORMAL HIGH (ref 0.0–2.0)
Bicarbonate: 29.9 mmol/L — ABNORMAL HIGH (ref 20.0–28.0)
Drawn by: 232811
FIO2: 60
O2 Saturation: 94.8 %
PEEP: 8 cmH2O
Patient temperature: 98.2
RATE: 16 resp/min
VT: 600 mL
pCO2 arterial: 59.3 mmHg — ABNORMAL HIGH (ref 32.0–48.0)
pH, Arterial: 7.322 — ABNORMAL LOW (ref 7.350–7.450)
pO2, Arterial: 79.7 mmHg — ABNORMAL LOW (ref 83.0–108.0)

## 2018-01-07 LAB — BASIC METABOLIC PANEL
ANION GAP: 9 (ref 5–15)
Anion gap: 7 (ref 5–15)
BUN: 72 mg/dL — ABNORMAL HIGH (ref 8–23)
BUN: 68 mg/dL — ABNORMAL HIGH (ref 8–23)
CHLORIDE: 122 mmol/L — AB (ref 98–111)
CO2: 27 mmol/L (ref 22–32)
CO2: 27 mmol/L (ref 22–32)
Calcium: 8.5 mg/dL — ABNORMAL LOW (ref 8.9–10.3)
Calcium: 8.3 mg/dL — ABNORMAL LOW (ref 8.9–10.3)
Chloride: 122 mmol/L — ABNORMAL HIGH (ref 98–111)
Creatinine, Ser: 1.6 mg/dL — ABNORMAL HIGH (ref 0.61–1.24)
Creatinine, Ser: 1.43 mg/dL — ABNORMAL HIGH (ref 0.61–1.24)
GFR calc Af Amer: 45 mL/min — ABNORMAL LOW (ref >60)
GFR calc Af Amer: 51 mL/min — ABNORMAL LOW (ref >60)
GFR calc non Af Amer: 39 mL/min — ABNORMAL LOW (ref >60)
GFR, EST NON AFRICAN AMERICAN: 44 mL/min — AB (ref >60)
GLUCOSE: 216 mg/dL — AB (ref 70–99)
Glucose, Bld: 177 mg/dL — ABNORMAL HIGH (ref 70–99)
POTASSIUM: 5.4 mmol/L — AB (ref 3.5–5.1)
Potassium: 5.1 mmol/L (ref 3.5–5.1)
Sodium: 158 mmol/L — ABNORMAL HIGH (ref 135–145)
Sodium: 156 mmol/L — ABNORMAL HIGH (ref 135–145)

## 2018-01-07 LAB — GLUCOSE, CAPILLARY
GLUCOSE-CAPILLARY: 177 mg/dL — AB (ref 70–99)
Glucose-Capillary: 148 mg/dL — ABNORMAL HIGH (ref 70–99)
Glucose-Capillary: 205 mg/dL — ABNORMAL HIGH (ref 70–99)
Glucose-Capillary: 210 mg/dL — ABNORMAL HIGH (ref 70–99)

## 2018-01-07 LAB — SODIUM, URINE, RANDOM: Sodium, Ur: 47 mmol/L

## 2018-01-07 LAB — OSMOLALITY, URINE: Osmolality, Ur: 704 mOsm/kg (ref 300–900)

## 2018-01-07 MED ORDER — FREE WATER
200.0000 mL | Status: DC
  Administered 2018-01-07 – 2018-01-08 (×5): 200 mL

## 2018-01-07 MED ORDER — LOSARTAN POTASSIUM 50 MG PO TABS
50.0000 mg | ORAL_TABLET | Freq: Every day | ORAL | Status: DC
  Administered 2018-01-07: 50 mg
  Filled 2018-01-07: qty 1

## 2018-01-07 MED ORDER — VITAL AF 1.2 CAL PO LIQD
1000.0000 mL | ORAL | Status: DC
  Administered 2018-01-07 – 2018-01-08 (×2): 1000 mL

## 2018-01-07 MED ORDER — LORAZEPAM 2 MG/ML IJ SOLN
2.0000 mg | INTRAMUSCULAR | Status: DC | PRN
  Administered 2018-01-07 – 2018-01-08 (×3): 2 mg via INTRAVENOUS
  Filled 2018-01-07 (×3): qty 1

## 2018-01-07 MED ORDER — DEXTROSE 5 % IV SOLN
INTRAVENOUS | Status: DC
  Administered 2018-01-07: 170 mL/h via INTRAVENOUS
  Administered 2018-01-08: 06:00:00 via INTRAVENOUS

## 2018-01-07 MED ORDER — SODIUM CHLORIDE 0.9 % IV SOLN
INTRAVENOUS | Status: DC
  Administered 2018-01-07: 08:00:00 via INTRAVENOUS

## 2018-01-07 MED ORDER — METHYLPREDNISOLONE SODIUM SUCC 40 MG IJ SOLR
40.0000 mg | Freq: Four times a day (QID) | INTRAMUSCULAR | Status: DC
  Administered 2018-01-07 – 2018-01-08 (×5): 40 mg via INTRAVENOUS
  Filled 2018-01-07 (×5): qty 1

## 2018-01-07 MED ORDER — DEXMEDETOMIDINE HCL IN NACL 200 MCG/50ML IV SOLN
0.4000 ug/kg/h | INTRAVENOUS | Status: DC
  Administered 2018-01-07: 0.9 ug/kg/h via INTRAVENOUS
  Administered 2018-01-07: 0.8 ug/kg/h via INTRAVENOUS
  Administered 2018-01-07: 0.9 ug/kg/h via INTRAVENOUS
  Administered 2018-01-07: 1.2 ug/kg/h via INTRAVENOUS
  Administered 2018-01-08 (×2): 0.9 ug/kg/h via INTRAVENOUS
  Filled 2018-01-07 (×5): qty 50

## 2018-01-07 MED ORDER — PRO-STAT SUGAR FREE PO LIQD
30.0000 mL | Freq: Two times a day (BID) | ORAL | Status: DC
  Administered 2018-01-07 (×2): 30 mL
  Filled 2018-01-07 (×2): qty 30

## 2018-01-07 NOTE — Consult Note (Signed)
Renal Service Consult Note [facility]  Justin Hester 01/07/2018 Justin Hester Requesting Physician: Dr Pennie Banter  Reason for Consult:  Hypernatremia HPI: The patient is a 84 y.o. year-old admitted on 12/10 for SOB and acute/chronic resp failure w CAP.  Hx of ILD and DVT , AT III def,  HTN, anemia. Admitted and started iV abx w/ NS at 100/hr.  Pt now has hypernatremia w/ Na 158, as well as new acute/ subacute CVA on MRI w/ R hemiparesis. Also has new PTX. Asked to see for hypernatremia.   Creat 1.1 on admit >> peak creat 2.0 on 12/16 and creat down to 1.6 today. BUN 15 >>> 72 today. Has rec'd IV steroids.  I/O's are 9L in and 13L out.  Wt's are down 5 kg from admission, 93 >> 88 kg.   UOP is about 1.7- 2.4 L per day.   BP's have all been normal to high.      Current meds are norvasc, IV steroids, asa, zantac, T4 and nebs. Fentanyl drip, versed iv prn. Has not rec'd IV contrast.    ROS  on vent, n/a   Past Medical History  Past Medical History:  Diagnosis Date  . Anemia   . Cataract   . Chronic constipation   . Diverticulosis of colon (without mention of hemorrhage)    constipation - new problem 3/12 and work up in progress...  . DJD (degenerative joint disease)   . DVT (deep venous thrombosis) (HCC) 1990s   6 mo coumadin  . GERD (gastroesophageal reflux disease)   . Hypertension   . Hypothyroidism   . Lower leg DVT (deep venous thromboembolism), acute, left ([place]) 06/15/2016  . Lumbar back pain   . Multiple pulmonary nodules 2006  . OSA on CPAP    Past Surgical History  Past Surgical History:  Procedure Laterality Date  . CATARACT EXTRACTION, BILATERAL     by Dr. Ricki Miller  . COLONOSCOPY  2012   diverticulosis o/w WNL [name])  . MOUTH SURGERY  03/28/2016   gum surgery  . TOTAL KNEE ARTHROPLASTY  left 9/06, right 10/07   Dr. Sharol Given   Family History  Family History  Problem Relation Age of Onset  . Heart disease Father   . Rheum arthritis  Father   . Rheum arthritis Mother   . Cancer Neg Hx    Social History  reports that he has never smoked. He has never used smokeless tobacco. He reports previous alcohol use. He reports that he does not use drugs. Allergies  Allergies  Allergen Reactions  . Cyclobenzaprine     Rash, chills, shaking  . Oxycodone-Acetaminophen     REACTION: unspecified  . Propoxyphene N-Acetaminophen     REACTION: itching---hot and cold flashes   Home medications Prior to Admission medications   Medication Sig Start Date End Date Taking? Authorizing Provider  acetaminophen (TYLENOL) 500 MG tablet Take 500 mg by mouth every 6 (six) hours as needed for mild pain.    Yes [provider]  amLODipine (NORVASC) 10 MG tablet Take 1 tablet (10 mg total) by mouth daily. 11/26/17  Yes Ria Bush, MD  apixaban (ELIQUIS) 5 MG TABS tablet Take 1 tablet (5 mg total) by mouth 2 (two) times daily. 11/26/17  Yes Ria Bush, MD  benzonatate (TESSALON) 200 MG capsule Take 1 capsule (200 mg total) by mouth 3 (three) times daily as needed for cough. 12/18/17  Yes Lauraine Rinne, NP  cycloSPORINE (RESTASIS) 0.05 % ophthalmic emulsion  Place 1 drop into both eyes 2 (two) times daily.   Yes [provider]  Desoximetasone 0.05 % GEL Apply 1 application topically daily.    Yes [provider]  furosemide (LASIX) 40 MG tablet Take 1 tablet (40 mg total) by mouth daily. 11/26/17  Yes Ria Bush, MD  levothyroxine (SYNTHROID, LEVOTHROID) 50 MCG tablet TAKE 1 TABLET BY MOUTH EVERY DAY; EXCEPT 2 TABLETS DAILY ON TUESDAY AND FRIDAY Patient taking differently: TAKE 1 TABLET BY MOUTH EVERY DAY; EXCEPT 2 TABLETS DAILY ON Monday AND FRIDAY 11/26/17  Yes Ria Bush, MD  losartan (COZAAR) 100 MG tablet Take 1 tablet (100 mg total) by mouth daily. 11/26/17  Yes Ria Bush, MD  polyethylene glycol [facility] / Floria Raveling) packet Take 17 g by mouth as needed.    Yes [provider]   losartan-hydrochlorothiazide (HYZAAR) 100-25 MG per tablet Take 1 tablet by mouth daily. 03/27/11 04/03/11  Noralee Space, MD   Liver Function Tests Recent Labs  Lab 01/06/18 0253  AST 32  ALT 42  ALKPHOS 57  BILITOT 0.7  PROT 6.2*  ALBUMIN 2.5*   No results for input(s): LIPASE, AMYLASE in the last 168 hours. CBC Recent Labs  Lab 01/05/18 0305 01/06/18 0253 01/07/18 0310  WBC 19.0* 19.3* 16.6*  NEUTROABS 17.3* 17.9* 15.6*  HGB 12.0* 11.2* 11.2*  HCT 40.6 37.8* 37.8*  MCV 100.7* 102.4* 100.5*  PLT 88* 88* 80*   Basic Metabolic Panel Recent Labs  Lab 01/02/18 0234 01/02/18 0841 01/02/18 1631 01/03/18 0326 01/03/18 1638 01/04/18 0328 01/06/18 0253 01/06/18 1104 01/07/18 0310  NA 139  --   --  146*  --  151* 155* 152* 158*  K 4.1  --   --  4.7  --  5.3* 5.3* 5.1 5.4*  CL 105  --   --  113*  --  116* 119* 120* 122*  CO2 22  --   --  21*  --  _0 GLUCOSE 137*  --   --  150*  --  202* 189* 240* 177*  BUN 55*  --   --  68*  --  79* 73* 74* 72*  CREATININE 1.61*  --   --  1.68*  --  2.07* 1.66* 1.66* 1.60*  CALCIUM 8.4*  --   --  8.4*  --  8.4* 8.5* 8.3* 8.5*  PHOS  --  4.4 5.9* 4.2 4.5  --   --   --   --    Iron/TIBC/Ferritin/ %Sat No results found for: IRON, TIBC, FERRITIN, IRONPCTSAT  Vitals:   01/07/18 0500 01/07/18 0600 01/07/18 0720 01/07/18 0800  BP: (!) 170/60 (!) 144/60    Pulse:  95 87   Resp: _1 Temp:    98.4 F (36.9 C)  TempSrc:    Oral  SpO2:  97% 93%   Weight: 88.4 kg     Height:       Exam Gen on vent , some agiatation, not responding to commands No rash, cyanosis or gangrene Sclera anicteric, throat w ETT  No jvd or bruits, bounding carotid pulse Chest coarse BS bilat RRR no MRG  Abd soft ntnd no mass or ascites +bs GU normal male MS no joint effusions or deformity Ext no sig LE edema, no wounds or ulcers Neuro is sedated on the vent    Home meds:  - apixaban 5 bid/ furosemide 40 qd  - levothyroxine qd  -  miralax  prn/ eyedrops   UA 12/10 > clear, negative  CT chest 08/2017 > 1. The previously identified nodule in the [place] in 2006 has resolved in the interval. The previously identified LEFT internal mammary lymph node is stable. No new or enlarging lung Nodules  2. Interval development of diffuse interstitial pulmonary fibrosis and generalized bronchiectasis since 2006   3. Mild cardiomegaly with moderate LAD and LEFT circumflex coronary artery atherosclerosis  CT angio chest 09/2017 > No evidence of pulmonary embolism. 2. No acute findings noted in the thorax to account for the patient's symptoms.  3. There is a spectrum of findings in the lungs compatible with interstitial lung disease. Based on these findings, this is considered ATS probable UIP (usual interstitial pneumonia).    Impression/ Plan: 1. Hypernatremia - starting IV D5W at 170 cc/hr to correct 1/2 the deficit in 24 hrs.  2. Volume - BP's up but wt's are down, will lower/ dc saline infusion which can be aggravating #1 above.  CVP would help but for now will consider him euvolemic 3. Resp failure - on IV steroids, severe disease by CT, on vent 4. PTX - per CCM 5. Hx DVT's - recurrent/ chronic issue     Kelly Splinter MD [facility] pager [phone]   01/07/2018, 9:24 AM

## 2018-01-07 NOTE — Progress Notes (Signed)
  Echocardiogram 2D Echocardiogram has been performed.  Justin Hester M 01/07/2018, 8:18 AM

## 2018-01-07 NOTE — Progress Notes (Signed)
Attempted to decrease sedation at the beginning of the shift to evaluate patients responsiveness. Pt minimally responsive to pain (sternal rub and large movement with turns), not following commands, brief eye opening with no tracking on 49mg of fentanyl at initial assessment. Pt was double stacking breaths with minimal vent alarm at that time. Fentanyl weaned to 2717m with no change in response, but breath stacking increased considerably with constant ventilator alarming of increased peak pressures. Sedation gradually increased and PRN versed for ventilator asynchrony. Will continue to monitor.

## 2018-01-07 NOTE — Progress Notes (Signed)
*  Preliminary Results* Bilateral lower extremity venous duplex completed. Bilateral lower extremities are positive for DVT. Preliminary results discussed with Clarene Critchley, RN. Refer to "CV Proc" under chart review to view complete preliminary results.  01/07/2018 10:57 AM Maudry Mayhew, MHA, RVT, RDCS, RDMS

## 2018-01-07 NOTE — Consult Note (Signed)
Neurology Consultation  Reason for Consult: Stroke Referring Physician: Lake Bells  CC: Stroke  History is obtained from: Chart  HPI: Justin Hester is a 84 y.o. male with history of obstructive sleep apnea, hypertension, DVT in the past, lumbar back pain, multiple pulmonary nodules.  Patient has had a significant medical course while in the hospital.  This includes acute respiratory failure/hypoxemia in the setting of multifocal infiltrates presumably related to Eliquis.,  Pneumothorax, pulmonary hypertension related to chronic pulmonary fibrosis, hypernatremia with sodium 158, and thrombocytopenia.  Neurology was consulted for acute/subacute stroke noted on MRI.  I was obtained secondary to medical staff noting that he was not moving his right side.  While patient has been in the ICU patient has suffered from a right pneumothorax and yesterday CT revealed a new left pneumothorax.  In addition patient is significantly hypernatremic.  LKW: Unknown tpa given?: no, known well unknown Premorbid modified Rankin scale (mRS): 0 NIH stroke scale of 27  XKG:YJEHUD to obtain due to altered mental status.   Past Medical History:  Diagnosis Date  . Anemia   . Cataract   . Chronic constipation   . Diverticulosis of colon (without mention of hemorrhage)    constipation - new problem 3/12 and work up in progress...  . DJD (degenerative joint disease)   . DVT (deep venous thrombosis) (HCC) 1990s   6 mo coumadin  . GERD (gastroesophageal reflux disease)   . Hypertension   . Hypothyroidism   . Lower leg DVT (deep venous thromboembolism), acute, left ([place]) 06/15/2016  . Lumbar back pain   . Multiple pulmonary nodules 2006  . OSA on CPAP     Family History  Problem Relation Age of Onset  . Heart disease Father   . Rheum arthritis Father   . Rheum arthritis Mother   . Cancer Neg Hx      Social History:   reports that he has never smoked. He has never used smokeless tobacco. He reports  previous alcohol use. He reports that he does not use drugs.  Medications  Current Facility-Administered Medications:  .  0.9 %  sodium chloride infusion, , Intravenous, Continuous, Olalere, Adewale A, MD, Last Rate: 100 mL/hr at 01/07/18 0807 .  acetaminophen (TYLENOL) solution 650 mg, 650 mg, Per Tube, Q6H PRN **OR** acetaminophen (TYLENOL) suppository 650 mg, 650 mg, Rectal, Q4H PRN, Wofford, Drew A, RPH .  amLODipine (NORVASC) tablet 10 mg, 10 mg, Per Tube, Daily, Wofford, Drew A, RPH, 10 mg at 01/06/18 1029 .  aspirin tablet 325 mg, 325 mg, Per Tube, Daily, Ollis, Brandi L, NP, 325 mg at 01/06/18 1757 .  bisacodyl (DULCOLAX) suppository 10 mg, 10 mg, Rectal, Daily PRN, Simonne Maffucci B, MD .  chlorhexidine gluconate (MEDLINE KIT) (PERIDEX) 0.12 % solution 15 mL, 15 mL, Mouth Rinse, BID, McQuaid, Astarita B, MD, 15 mL at 01/06/18 2051 .  cycloSPORINE (RESTASIS) 0.05 % ophthalmic emulsion 1 drop, 1 drop, Both Eyes, BID, Emokpae, Ejiroghene E, MD, 1 drop at 01/07/18 0009 .  dextromethorphan (DELSYM) 30 MG/5ML liquid 30 mg, 30 mg, Per Tube, BID, Polly Cobia, RPH, 30 mg at 01/07/18 0013 .  docusate (COLACE) 50 MG/5ML liquid 100 mg, 100 mg, Per Tube, BID PRN, Simonne Maffucci B, MD .  feeding supplement (VITAL AF 1.2 CAL) liquid 1,000 mL, 1,000 mL, Per Tube, Continuous, McQuaid, Mehlhoff B, MD, Last Rate: 20 mL/hr at 01/06/18 0513, 1,000 mL at 01/06/18 0513 .  fentaNYL (SUBLIMAZE) injection 50 mcg, 50 mcg, Intravenous, Q2H  PRN, Juanito Doom, MD, 50 mcg at 01/02/18 1457 .  fentaNYL 2563mg in NS 2543m(1038mml) infusion-PREMIX, 0-400 mcg/hr, Intravenous, Continuous, McQuaid, Kincade B, MD, Last Rate: 40 mL/hr at 01/07/18 0600, 400 mcg/hr at 01/07/18 0600 .  free water 200 mL, 200 mL, Per Tube, Q4H, Olalere, Adewale A, MD .  guaiFENesin (ROBITUSSIN) 100 MG/5ML solution 300 mg, 15 mL, Per Tube, Q6H, Wofford, Drew A, RPH, 300 mg at 01/07/18 0518 .  ipratropium-albuterol (DUONEB) 0.5-2.5 (3)  MG/3ML nebulizer solution 3 mL, 3 mL, Nebulization, Q6H PRN, Emokpae, Ejiroghene E, MD, 3 mL at 12/30/17 0158 .  ipratropium-albuterol (DUONEB) 0.5-2.5 (3) MG/3ML nebulizer solution 3 mL, 3 mL, Nebulization, TID, Emokpae, Ejiroghene E, MD, 3 mL at 01/07/18 0720 .  levothyroxine (SYNTHROID, LEVOTHROID) tablet 50 mcg, 50 mcg, Per Tube, Q0600, WofPolly CobiaPH, 50 mcg at 01/07/18 0519 .  levothyroxine (SYNTHROID, LEVOTHROID) tablet 50 mcg, 50 mcg, Per Tube, Once per day on Mon Fri, Wofford, Drew A, RPH, 50 mcg at 01/04/18 0612130 MEDLINE mouth rinse, 15 mL, Mouth Rinse, 10 times per day, McQSimonne Maffucci MD, 15 mL at 01/07/18 0526 .  methylPREDNISolone sodium succinate (SOLU-MEDROL) 40 mg/mL injection 40 mg, 40 mg, Intravenous, Q6H, Olalere, Adewale A, MD, 40 mg at 01/07/18 0805 .  midazolam (VERSED) injection 1 mg, 1 mg, Intravenous, Q1H PRN, SomAnders SimmondsD, 1 mg at 01/07/18 0455 .  ondansetron (ZOFRAN) tablet 4 mg, 4 mg, Oral, Q6H PRN **OR** ondansetron (ZOFRAN) injection 4 mg, 4 mg, Intravenous, Q6H PRN, Emokpae, Ejiroghene E, MD, 4 mg at 01/02/18 [id number] polyethylene glycol (MIRALAX / GLYCOLAX) packet 17 g, 17 g, Oral, Daily PRN, Emokpae, Ejiroghene E, MD .  polyethylene glycol (MIRALAX / GLYCOLAX) packet 17 g, 17 g, Per Tube, Daily, Wofford, Drew A, RPH, 17 g at 01/06/18 1028 .  ranitidine (ZANTAC) 150 MG/10ML syrup 150 mg, 150 mg, Per Tube, Daily, BelEudelia BunchPH, 150 mg at 01/06/18 1027   Exam: Current vital signs: BP (!) 144/60   Pulse 87   Temp 98.4 F (36.9 C) (Oral)   Resp 17   Ht _0  (1.803 m)   Wt 88.4 kg   SpO2 93%   BMI 27.18 kg/m  Vital signs in last 24 hours: Temp:  [97.5 F (36.4 C)-100 F (37.8 C)] 98.4 F (36.9 C) (12/19 0800) Pulse Rate:  [84-97] 87 (12/19 0720) Resp:  [10-21] 17 (12/19 0720) BP: (125-170)/(52-71) 144/60 (12/19 0600) SpO2:  [93 %-100 %] 93 % (12/19 0720) FiO2 (%):  [45 %-60 %] 60 % (12/19 0720) Weight:  [88.4 kg] 88.4 kg  (12/19 0500)  Physical Exam  Constitutional: Appears well-developed and well-nourished.  Psych: Sedated Eyes: No scleral injection HENT: No OP obstrucion Head: Normocephalic.  Cardiovascular: Normal rate and regular rhythm.  Respiratory: Effort normal, non-labored breathing GI: Soft.  No distension. There is no tenderness.  Skin: WDI  Neuro: Mental Status: Patient currently under significant sedation.  Only responds to noxious stimuli on the left side.  Does not follow any verbal commands. Cranial Nerves: II: No blink to threat  III,IV, VI: Eyes are disconjugate with vertical deviation and no doll's noted.  Pupils are irregular bilaterally and no reaction to light that I could visualize  V: Unable to obtain VII: Grimaces to pain VIII: Slightly opens eyes to verbal stimuli -Intubated Motor: Moving left arm antigravity to noxious stimuli, withdrawing antigravity to noxious stimuli on the left.  No movement on the  right. Sensory: As noted noxious stimuli is felt on the left but not the right Deep Tendon Reflexes: 2+ deep tendon reflexes in the upper extremities on the left and left knee jerk, no deep tendon reflexes noted in the right upper extremity but is noted in the right knee jerk Plantars: Upgoing bilaterally Cerebellar: Unable to examine  Labs I have reviewed labs in epic and the results pertinent to this consultation are:   CBC    Component Value Date/Time   WBC 16.6 (H) 01/07/2018 0310   RBC 3.76 (L) 01/07/2018 0310   HGB 11.2 (L) 01/07/2018 0310   HCT 37.8 (L) 01/07/2018 0310   PLT 80 (L) 01/07/2018 0310   MCV 100.5 (H) 01/07/2018 0310   MCH 29.8 01/07/2018 0310   MCHC 29.6 (L) 01/07/2018 0310   RDW 13.7 01/07/2018 0310   LYMPHSABS 0.2 (L) 01/07/2018 0310   MONOABS 0.6 01/07/2018 0310   EOSABS 0.0 01/07/2018 0310   BASOSABS 0.0 01/07/2018 0310    CMP     Component Value Date/Time   NA 158 (H) 01/07/2018 0310   K 5.4 (H) 01/07/2018 0310   CL 122 (H)  01/07/2018 0310   CO2 27 01/07/2018 0310   GLUCOSE 177 (H) 01/07/2018 0310   BUN 72 (H) 01/07/2018 0310   CREATININE 1.60 (H) 01/07/2018 0310   CALCIUM 8.5 (L) 01/07/2018 0310   PROT 6.2 (L) 01/06/2018 0253   ALBUMIN 2.5 (L) 01/06/2018 0253   AST 32 01/06/2018 0253   ALT 42 01/06/2018 0253   ALKPHOS 57 01/06/2018 0253   BILITOT 0.7 01/06/2018 0253   GFRNONAA 39 (L) 01/07/2018 0310   GFRAA 45 (L) 01/07/2018 0310    Lipid Panel     Component Value Date/Time   CHOL 166 07/08/2017 0948   TRIG 56.0 07/08/2017 0948   HDL 45.30 07/08/2017 0948   CHOLHDL 4 07/08/2017 0948   VLDL 11.2 07/08/2017 0948   LDLCALC 109 (H) 07/08/2017 0948     Imaging I have reviewed the images obtained:  CT-scan of the brain-large left occipital low-density is noted concerning for acute infarct.  MRI examination of the brain- left PCA distribution large late acute/early subacute infarction.  Additional scattered small bilateral watershed distribution infarcts as well as infarcts in bilateral cerebellar hemispheres.  Carotid duplex- left carotid showed 1-39% stenosis, right ICA showed 40-59% stenosis with antegrade flow.  Echocardiogram that was obtained on 01/01/2018- 65% - 70% EF, there was a possible, very small PFO  Echocardiogram limited bubble study- no definite, large atrial level shunt.  However cannot exclude small atrial level shunt due to suboptimal imaging.  At this time the recommend considering TEE if diagnosis of PFO is clinically significant   Etta Quill PA-C [facility] [phone]  M-F  (9:00 am- 5:00 PM)  01/07/2018, 8:54 AM     Assessment:  84 year old male with multiple medical issues including bilateral pneumothorax, hypernatremia, possible PFO, large occipital infarct on the left along with watershed infarcts and bilateral cerebellar infarcts.  Recommendations: - Obtain fasting lipid panel and A1c - When stable and if family would like to move further with  stroke work-up or other factors change obtain MRA of head.

## 2018-01-07 NOTE — Progress Notes (Signed)
Further discussed with spouse and daughter  Patient is DNR status  Patients wishes will be not to be kept alive with artificial measures without his quality of life He has an exacerbation of interstitial lung disease as evidenced by his known pulmonary fibrosis and rapid deterioration with acute hypoxemic respiratory failure and multifocal interstitial infiltrate , complicated by a new CVA, unable to be managed off sedatives and remains encephalopathic.  Chances of recovery is poor with his current multiple comorbidities and he is unlikely to be able to be liberated from the ventilator with his extensive pulmonary disease.  Our goal will be comfort measures in the AM

## 2018-01-07 NOTE — Progress Notes (Addendum)
Nutrition Follow-up  DOCUMENTATION CODES:   Not applicable  INTERVENTION:  - Will order 30 mL Prostat BID.  - Dr. Ander Slade states ok to advance to goal rate; advance Vital AF 1.2 by 10 mL every 8 hours to reach goal rate of Vital AF 1.2 @ 60 mL/hr with 30 mL Prostat BID. - At goal rate, TF regimen will provide 1928 kcal (102% estimated kcal need), 138 grams of protein, and 1168 mL free water. - Free water flush to be per CCM.   NUTRITION DIAGNOSIS:   Inadequate oral intake related to inability to eat as evidenced by NPO status. -ongoing  GOAL:   Patient will meet greater than or equal to 90% of their needs -unmet with trickle TF  MONITOR:   Vent status, TF tolerance, Weight trends, Labs, I & O's  ASSESSMENT:   84 year old male with obstructive sleep apnea and ILD admitted on December 29, 2017 with shortness of breath and acute on chronic respiratory failure with hypoxemia due to evidence of community-acquired pneumonia.  Estimated kcal need updated. Weight -5 kg/11 lb since admission. Noted that patient previously had deep and very deep edema to BLE, now with moderate edema to BLE. Patient remains intubated with NGT in place. He is receiving Vital AF 1.2 @ 20 ml/hr with 200 mL free water every 4 hours. This regimen provides 576 kcal, 36 grams of protein, and 389 mL free water.   Neuro following patient d/t acute/subacute stroke noted on MRI and R-sided weakness/paralysis.   Per Brandi's note yesterday: acute respiratory failure with multifocal infiltrates--completed antibiotics course, bilateral pneumothorax with L-side chest tube, nasopharyngeal bleeding following NGT placement, pulmonary HTN, hypernatremia--6L free water deficit, new L occipital CVA, guarded prognosis and low probability of weaning off the vent in the short-term.    Medications reviewed; 50 mcg Synthroid per NGT/day, 40 mg Solu-medrol QID, 1 packet Miralax/day, 150 mg Zantac per NGT/day.  Labs reviewed; CBGs: 148  and 177 mg/dL today, Na: 158 mmol/L, K: 5.4 mmol/L, Cl: 122 mmol/L, Ca: 8.5 mg/dL, BUN: 72 mg/dL, creatinine: 1.6 mg/dL, GFR: 45 mL/min. IVF; D5 @ 170 mL/hr (694 kcal).    Diet Order:   Diet Order            Diet NPO time specified  Diet effective now              EDUCATION NEEDS:   Not appropriate for education at this time  Skin:  Skin Assessment: Reviewed RN Assessment  Last BM:  12/19  Height:   Ht Readings from Last 1 Encounters:  01/06/18 5' 11" (1.803 m)    Weight:   Wt Readings from Last 1 Encounters:  01/07/18 88.4 kg    Ideal Body Weight:  78.2 kg  BMI:  Body mass index is 27.18 kg/m.  Estimated Nutritional Needs:   Kcal:  1896 kcal  Protein:  130-140g  Fluid:  1.9L/day     Jarome Matin, MS, RD, LDN, CNSC Inpatient Clinical Dietitian Pager # [phone] After hours/weekend pager # [phone]

## 2018-01-07 NOTE — Progress Notes (Signed)
PULMONARY / CRITICAL CARE MEDICINE   NAME:  Justin Hester, MRN:  [id number], DOB:  [birth year], LOS: 9 ADMISSION DATE:  01/19/2018, CONSULTATION DATE:  12/30/2017 REFERRING MD:  Algis Liming, CHIEF COMPLAINT:  Dyspnea  BRIEF HISTORY:    84 year old male with obstructive sleep apnea and ILD admitted on December 29, 2017 with shortness of breath and acute on chronic respiratory failure with hypoxemia due to evidence of community-acquired pneumonia.  SIGNIFICANT PAST MEDICAL HISTORY   Obstructive sleep apnea, pulmonary nodules, recurrent DVT, gastroesophageal reflux disease, diabetes mellitus.  SIGNIFICANT EVENTS:  12/10 admission 12/11 move to ICU for close observation 12/13 BIPAP 12/14 Intubated, BAL 12/15 twitching, EEG pending. Vent dyssynchrony noted 12/16 Questionable seizure, EEG neg 12/17 Concern for SQ air on left>right lateral neck 12/19 status post left chest tube placement  STUDIES:   October 03, 2017 CT angiogram chest showed peripheral interlobular septal thickening, images somewhat blurry but there does appear to be peripheral honeycombing worse in the bases, heterogeneous changes with areas of lung spared, images independently reviewed no pulmonary embolism.  Radiology notes the findings are probable UIP October 30 blood work ANA negative, rheumatoid factor negative November 2019 lung function testing showed total lung capacity of 4.18 L 57% predicted DLCO 14.4 mL 42% predicted December 29, 2017 chest x-ray images independently reviewed showing bilateral airspace disease, some pleural effusion question cephalization December 11/2017 Labs: ANCA negative, Jo-1 negative, SSA/SSB negative, SCL-70 negative CT scan of the head on 01/06/2018 noted MRI of the head 01/06/2018 noted for left occipital stroke, watershed stroke  CULTURES:  12/10 BCx2 >> negative 12/11 RSV > negative 12/11 Flu > negative 12/10 urine strep > negative 12/11 urine legionella > negative  12/14 BAL  Bact > negative, normal flora  12/14 BAL Fungus > no fungus no smear >> 12/14 BAL AFB >> negative smear >>  ANTIBIOTICS:  12/10 ceftriaxone >> 12/17  12/10 azithro >> 12/17 12/10 tamiflu x1 dose  LINES/TUBES:  ETT 12/14 >>  Left-sided chest tube 01/06/2018  CONSULTANTS:  Pulmonary medicine Neurology-for his CVA Nephrology-for his hypernatremia  SUBJECTIVE:  RN reports no acute events.  FiO2 55%/PEEP 10.  Remains on fentanyl gtt  CONSTITUTIONAL: BP (!) 144/60   Pulse 87   Temp 98.4 F (36.9 C) (Oral)   Resp 17   Ht _0  (1.803 m)   Wt 88.4 kg   SpO2 93%   BMI 27.18 kg/m   I/O last 3 completed shifts: In: 2065.3 [I.V.:1658; IV Piggyback:407.3] Out: 2970 [Urine:2970]  PHYSICAL EXAM: General: elderly male lying in bed on vent in NAD HEENT: Moist oral mucosa Neuro: sedate, on fentanyl CV: S1-S2 appreciated PULM: Even, nonlabored GI: Soft, nontender, bowel sounds appreciated Extremities: warm/dry, trace BLE pitting edema  Skin: no rashes or lesions  RESOLVED PROBLEM LIST   ASSESSMENT AND PLAN    Acute respiratory failure with hypoxemia in setting of multifocal infiltrates -broad ddx, see discussion on 12/11 note> no compelling micro data to diagnose CAP. More worried about a flare of his underlying ILD; no bleeding noted on BAL 12/14, no evidence of DAH; there are a few case reports in the literature of lung injury after starting Eliquis (J Stroke Cerebrovasc Dis. 2016 Jul;25(7):1767-1769. doi: 10.1016/j.jstrokecerebrovasdis.2016.03.036. Epub 2016 Apr 15.) P: D7/7 abx, stop after 12/17 doses completed Continue solumedrol changed to 40 every 6 PRVC 8 cc/kg  Wean PEEP/FiO2 for sats >88% Continue sedation for ventilator synchrony   Subcutaneous Air around neck on exam P: Chest x-ray did reveal pneumothorax-chest tube was placed  on the left Small pneumo on the right, may be larger on chest x-ray today-however, May be related to rotation May require chest tube  on right if he continues to be desynchronous with the vent, breath stacking  Nose bleed vs Pharyngeal bleeding after NG placement P: Do not restart Eliquis  No evidence of bleeding at present however anticoagulation is to be balanced with his new stroke Thrombocytopenia  Hx DVT -right popliteal fossa, dx in 08/2017.  On eliquis prior to admit -was discontinued secondary to consideration about possibility of ILD association P:  heparin was not started secondary to an active nasopharyngeal bleed  Diffuse Bilateral Infiltrates  -doubt CAP but treated 7 days with abx P: ABX completed 12/17 Monitor fever curve / WBC trend off abx.  Note elevated WBC in setting of high dose steroids  We will continue to monitor  Diffuse parenchymal lung disease: IPF flare? Eliquis related P: Solumedrol 60m q6  Hypertension P: Continue amlodipine  Losartan at 50  Hypothyroid P: Continue synthroid   Acute encephalopathy/ICU delirium P: Fentanyl gtt for pain / sedation  PRN versed  RASS Goal: vent synchrony, -2  Mild normocytic anemia without bleeding P: Trend CBC  Transfuse per ICU guidelines    Best Practice / Goals of Care / Disposition.    DVT PROPHYLAXIS:  SUP: ranitidine  NUTRITION: TF MOBILITY: bedrest  GOALS OF CARE: Full code  FAMILY DISCUSSIONS: will update family DISPOSITION:  ICU  LABS  Glucose Recent Labs  Lab 01/06/18 0808 01/06/18 1620 01/06/18 2005 01/06/18 2323 01/07/18 0310 01/07/18 0735  GLUCAP 173* 140* 164* 179* 148* 177*    BMET Recent Labs  Lab 01/06/18 0253 01/06/18 1104 01/07/18 0310  NA 155* 152* 158*  K 5.3* 5.1 5.4*  CL 119* 120* 122*  CO2 _0 BUN 73* 74* 72*  CREATININE 1.66* 1.66* 1.60*  GLUCOSE 189* 240* 177*    Liver Enzymes Recent Labs  Lab 01/06/18 0253  AST 32  ALT 42  ALKPHOS 57  BILITOT 0.7  ALBUMIN 2.5*    Electrolytes Recent Labs  Lab 01/02/18 1631 01/03/18 0326 01/03/18 1638  01/06/18 0253  01/06/18 1104 01/07/18 0310  CALCIUM  --  8.4*  --    < > 8.5* 8.3* 8.5*  MG 2.8* 3.0* 3.4*  --   --   --   --   PHOS 5.9* 4.2 4.5  --   --   --   --    < > = values in this interval not displayed.    CBC Recent Labs  Lab 01/05/18 0305 01/06/18 0253 01/07/18 0310  WBC 19.0* 19.3* 16.6*  HGB 12.0* 11.2* 11.2*  HCT 40.6 37.8* 37.8*  PLT 88* 88* 80*    ABG Recent Labs  Lab 01/06/18 1005 01/06/18 1529 01/07/18 0330  PHART 7.341* 7.349* 7.322*  PCO2ART 55.2* 55.2* 59.3*  PO2ART 43.9* 49.1* 79.7*    Coag's Recent Labs  Lab 01/04/18 0328 01/05/18 1342  APTT 30  --   INR  --  1.37    Sepsis Markers No results for input(s): LATICACIDVEN, PROCALCITON, O2SATVEN in the last 168 hours.  Cardiac Enzymes No results for input(s): TROPONINI, PROBNP in the last 168 hours.  Ccm 30 min  Khaza Blansett ODonnetta HutchingPulmonary & Critical Care Pgr: [phone] or if no answer [phone]/19/2019, 10:46 AM

## 2018-01-07 NOTE — Progress Notes (Signed)
Labs reviewed this morning revealing hypernatremia This is likely related to central diabetes insipidus in the context of a CVA  Free water deficit about 6 L We will start on normal saline at 100 cc an hour Increase free water intake 200 cc every 4 per tube  Urine osmolality  Repeat electrolytes at 11

## 2018-01-07 NOTE — Progress Notes (Signed)
Family updated regarding ultrasound of the lower extremity revealing DVTs Patient uncomfortable on the vent at present  Will add Precedex  Prognosis is limited Patient will not wean off the vent with his pulmonary fibrosis -very extensive disease Multiple comorbidities New CVA

## 2018-01-08 DIAGNOSIS — J8410 Pulmonary fibrosis, unspecified: Secondary | ICD-10-CM

## 2018-01-08 DIAGNOSIS — I2729 Other secondary pulmonary hypertension: Secondary | ICD-10-CM

## 2018-01-08 LAB — CBC WITH DIFFERENTIAL/PLATELET
Abs Immature Granulocytes: 0.1 10*3/uL — ABNORMAL HIGH (ref 0.00–0.07)
BASOS PCT: 0 %
Basophils Absolute: 0 10*3/uL (ref 0.0–0.1)
Eosinophils Absolute: 0 10*3/uL (ref 0.0–0.5)
Eosinophils Relative: 0 %
HCT: 34 % — ABNORMAL LOW (ref 39.0–52.0)
Hemoglobin: 10 g/dL — ABNORMAL LOW (ref 13.0–17.0)
Immature Granulocytes: 1 %
Lymphocytes Relative: 2 %
Lymphs Abs: 0.3 10*3/uL — ABNORMAL LOW (ref 0.7–4.0)
MCH: 29.5 pg (ref 26.0–34.0)
MCHC: 29.4 g/dL — ABNORMAL LOW (ref 30.0–36.0)
MCV: 100.3 fL — ABNORMAL HIGH (ref 80.0–100.0)
MONO ABS: 0.4 10*3/uL (ref 0.1–1.0)
Monocytes Relative: 3 %
Neutro Abs: 11.5 10*3/uL — ABNORMAL HIGH (ref 1.7–7.7)
Neutrophils Relative %: 94 %
Platelets: 73 10*3/uL — ABNORMAL LOW (ref 150–400)
RBC: 3.39 MIL/uL — AB (ref 4.22–5.81)
RDW: 13.2 % (ref 11.5–15.5)
WBC: 12.2 10*3/uL — AB (ref 4.0–10.5)
nRBC: 0 % (ref 0.0–0.2)

## 2018-01-08 MED ORDER — DEXMEDETOMIDINE HCL IN NACL 400 MCG/100ML IV SOLN
0.4000 ug/kg/h | INTRAVENOUS | Status: DC
  Administered 2018-01-08: 0.9 ug/kg/h via INTRAVENOUS
  Filled 2018-01-08: qty 100

## 2018-01-08 MED ORDER — MIDAZOLAM HCL 2 MG/2ML IJ SOLN: 1.0000 mg | INTRAMUSCULAR | Status: DC | PRN

## 2018-01-08 MED ORDER — GLYCOPYRROLATE 1 MG PO TABS: 1.0000 mg | ORAL_TABLET | ORAL | Status: DC | PRN

## 2018-01-08 MED ORDER — DIPHENHYDRAMINE HCL 50 MG/ML IJ SOLN: 25.0000 mg | INTRAMUSCULAR | Status: DC | PRN

## 2018-01-08 MED ORDER — DEXTROSE 5 % IV SOLN: INTRAVENOUS | Status: DC

## 2018-01-08 MED ORDER — GLYCOPYRROLATE 0.2 MG/ML IJ SOLN: 0.2000 mg | INTRAMUSCULAR | Status: DC | PRN

## 2018-01-08 MED ORDER — MIDAZOLAM 50MG/50ML (1MG/ML) PREMIX INFUSION
0.0000 mg/h | INTRAVENOUS | Status: DC
  Administered 2018-01-08: 1 mg/h via INTRAVENOUS
  Filled 2018-01-08 (×2): qty 50

## 2018-01-08 MED ORDER — MORPHINE 100MG IN NS 100ML (1MG/ML) PREMIX INFUSION
0.0000 mg/h | INTRAVENOUS | Status: DC
  Administered 2018-01-08: 5 mg/h via INTRAVENOUS
  Filled 2018-01-08 (×3): qty 100

## 2018-01-08 MED ORDER — ACETAMINOPHEN 650 MG RE SUPP: 650.0000 mg | Freq: Four times a day (QID) | RECTAL | Status: DC | PRN

## 2018-01-08 MED ORDER — MORPHINE BOLUS VIA INFUSION
5.0000 mg | INTRAVENOUS | Status: DC | PRN
  Administered 2018-01-08 (×2): 5 mg via INTRAVENOUS
  Filled 2018-01-08: qty 5

## 2018-01-08 MED ORDER — MORPHINE SULFATE (PF) 2 MG/ML IV SOLN: 2.0000 mg | INTRAVENOUS | Status: DC | PRN

## 2018-01-08 MED ORDER — GLYCERIN-HYPROMELLOSE-PEG 400 0.2-0.2-1 % OP SOLN: 1.0000 [drp] | Freq: Four times a day (QID) | OPHTHALMIC | Status: DC | PRN

## 2018-01-08 MED ORDER — SODIUM CHLORIDE 0.9 % IV SOLN: 0.0000 mg/h | INTRAVENOUS | Status: DC

## 2018-01-08 MED ORDER — ACETAMINOPHEN 325 MG PO TABS: 650.0000 mg | ORAL_TABLET | Freq: Four times a day (QID) | ORAL | Status: DC | PRN

## 2018-01-08 MED ORDER — MIDAZOLAM BOLUS VIA INFUSION (WITHDRAWAL LIFE SUSTAINING TX)
2.0000 mg | INTRAVENOUS | Status: DC | PRN
  Administered 2018-01-08 (×2): 2 mg via INTRAVENOUS
  Filled 2018-01-08: qty 2

## 2018-01-11 ENCOUNTER — Telehealth: Payer: Self-pay | Admitting: Family Medicine

## 2018-01-11 ENCOUNTER — Ambulatory Visit: Payer: Medicare Other | Admitting: Pulmonary Disease

## 2018-01-11 NOTE — Telephone Encounter (Signed)
Called wife, expressed my condolences.  Sudden onset pneumonia illness with severe complications.

## 2018-01-12 ENCOUNTER — Telehealth: Payer: Self-pay | Admitting: Pulmonary Disease

## 2018-01-12 NOTE — Telephone Encounter (Incomplete)
01/12/18 I  Received D/C from [facility]. Dr.Mannam said he would be glad to sign for the Dr Ander Slade who is on Vacation. Sent to [facility] by [place].  PWR

## 2018-01-14 NOTE — Telephone Encounter (Signed)
Received death certificate signed by Dr. Vaughan Browner. Faxed to [facility] per their request.  01/14/18  3pm LM

## 2018-01-15 ENCOUNTER — Ambulatory Visit: Payer: Medicare Other | Admitting: Pulmonary Disease

## 2018-01-20 NOTE — Care Management Note (Signed)
Case Management Note  Patient Details  Name: Kele Barthelemy MRN: [id number] Date of Birth: 1930/03/18  Subjective/Objective:    pnuemonia               Discharge readiness is indicated by patient meeting Recovery Milestones, including ALL of the following: ? Hemodynamic stability yes ? Tachypnea absent no -on full vent support ? Hypoxemia absent Fio2 at 60% ?    Action/Plan: Following for progression of carein icu level No cm needs present at time of review.  Expected Discharge Date:  (unknown)               Expected Discharge Plan:  Home/Self Care  In-House Referral:     Discharge planning Services  CM Consult  Post Acute Care Choice:    Choice offered to:     DME Arranged:    DME Agency:     HH Arranged:    HH Agency:     Status of Service:  In process, will continue to follow  If discussed at Long Length of Stay Meetings, dates discussed:    Additional Comments:  Leeroy Cha, RN 01-19-2018, 10:09 AM

## 2018-01-20 NOTE — Death Summary Note (Signed)
DEATH SUMMARY   Patient Details  Name: Con Arganbright MRN: [id number] DOB: 1930/10/01  Admission/Discharge Information   Admit Date:  01/10/2018  Date of Death: Date of Death: 01/20/18  Time of Death: Time of Death: 41  Length of Stay: 2022/03/13  Referring Physician: Ria Bush, MD   Reason(s) for Hospitalization  Shortness of breath  Diagnoses  Preliminary cause of death: Respiratory failure ([place]) Secondary Diagnoses (including complications and co-morbidities):  Active Problems:   CAP (community acquired pneumonia)   Acute respiratory failure with hypoxemia ([place])   Acute respiratory failure with hypoxia ([facility]) Acute exacerbation of interstitial lung disease Cerebrovascular accident Deep vein thrombosis  Brief Hospital Course (including significant findings, care, treatment, and services provided and events leading to death)  Pheonix Clinkscale is a 85 y.o. year old male who admitted to the hospital with worsening shortness of breath. Progressive shortness of breath with increasing cough He was admitted to the hospital on 01-10-18 with shortness of breath. Concern for community-acquired pneumonia. Has had progressive symptoms for about 2 months. Was started on treatment for pneumonia Transferred to the ICU for monitoring and worsening of his respiratory status-increasing oxygen requirement, started on the BiPAP 01/01/2018-episodes of confusion, echo revealing pulmonary hypertension 01/02/2018-worsening oxygenation despite BiPAP, intubated, had bronchoscopy performed 01/03/2018-continued antibiotics, pulse dose steroids started, Eliquis held.  Bleeding noted post NG tube placement.  Plan for pulse dose steroids for 72 hours.  Arm Twitching noted-received benzodiazepines, EEG ordered, continued agitation requiring increasing sedation. 01/04/2018-continue with sedation-anticoagulation not started secondary to notable bleeding. Encephalopathy felt to be related to  hypoxemia 01/05/2018 noted to have some subcutaneous emphysema-chest x-ray did reveal a small pneumothorax on the right, multiple repeat chest x-rays revealed stable pneumothorax with no significant worsening of subcutaneous emphysema.  PEEP reduced.  With reducing sedation patient noted to be moving extremities. 01/06/2018 noted to be weak on his right side with reducing sedation-CT head and CT chest ordered-CT had revealed a CVA, CT chest revealed moderately large pneumothorax on the left, small pneumo on the right.  Chest tube was placed on the left-with good reexpansion of the lung.  Continue to monitor the right side. Family updated about CT findings.  Neurology consulted.  Ventilator dyssynchrony requiring increased sedation 01/07/2018-updated family about his current status and multiple comorbidities.  Discussion about patient's wishes.  No progress being made with his pulmonary fibrosis flare-unlikely to wean from the ventilator.  Ultrasound of his lower extremity did reveal a DVT. Follow-up discussions regarding patient's desires-family notes that patient does not desire to be kept alive by artificial means-comfort measures desired.  Family will convene on 01/20/18 for withdrawal of care 01/20/18-patient was terminally weaned off the ventilator.  Expired at 11:50 AM.    Pertinent Labs and Studies  Significant Diagnostic Studies Dg Chest 1 View  Result Date: 01/06/2018 CLINICAL DATA:  Chest tube.  Pneumothorax EXAM: CHEST  1 VIEW COMPARISON:  01/06/2018 FINDINGS: Interval placement of pigtail chest tube on the left with improvement in left pneumothorax compared with the CT earlier today. No significant effusion Right sided pneumothorax appears unchanged, proximally 10-20%. Extensive pneumomediastinum and subcutaneous gas bilaterally unchanged. Endotracheal tube in good position. NG tube in the stomach Diffuse bilateral airspace disease unchanged. Improvement in bibasilar atelectasis with  improved lung volume. IMPRESSION: Interval placement of pigtail chest tube on the left with decrease in left pneumothorax. No change in small right pneumothorax approximately 10-20% Diffuse bilateral airspace disease is severe. Improvement in bibasilar atelectasis. Endotracheal tube remains in good  position. Electronically Signed   By: Franchot Gallo M.D.   On: 01/06/2018 13:39   Dg Chest 2 View  Result Date: 01/13/2018 CLINICAL DATA:  Shortness of breath EXAM: CHEST - 2 VIEW COMPARISON:  10/03/2017 FINDINGS: Cardiac shadow is mildly enlarged. Diffuse fibrotic changes are again identified throughout both lungs similar to that seen on prior CT examination. There are however patchy infiltrative opacities in both lungs slightly worse on the right than the left particularly in the upper and lower lobes. Similar changes in the left base are noted. No sizable effusion is noted. No bony abnormality is seen. IMPRESSION: Acute on chronic infiltrates in both lungs slightly worse on the right than the left. Electronically Signed   By: Inez Catalina M.D.   On: 12/24/2017 17:37   Dg Abd 1 View  Result Date: 01/03/2018 CLINICAL DATA:  NG tube position. EXAM: ABDOMEN - 1 VIEW COMPARISON:  Radiograph yesterday. FINDINGS: Tip of the enteric tube projects over the proximal stomach, side-port just beyond the gastroesophageal junction. Increasing gaseous distention of small bowel centrally. IMPRESSION: 1. Tip and side port of the enteric tube below the diaphragm in the stomach. 2. Increasing gaseous distention of small bowel centrally, suspicious for ileus. Electronically Signed   By: Keith Rake M.D.   On: 01/03/2018 05:46   Ct Head Wo Contrast  Result Date: 01/06/2018 CLINICAL DATA:  Altered level of consciousness, decreased right arm movement. EXAM: CT HEAD WITHOUT CONTRAST TECHNIQUE: Contiguous axial images were obtained from the base of the skull through the vertex without intravenous contrast. COMPARISON:   None. FINDINGS: Brain: Mild chronic ischemic white matter disease is noted. Left occipital low density is noted concerning for acute infarction. No midline shift is noted. Ventricular size is within normal limits. No definite hemorrhage is noted. Vascular: No hyperdense vessel or unexpected calcification. Skull: Normal. Negative for fracture or focal lesion. Sinuses/Orbits: No acute finding. Other: Gas is noted in visible cervical tissues concerning for pneumomediastinum; please see CT scan of the chest is same day for discussion. IMPRESSION: Large left occipital low density is noted concerning for acute infarction. These results will be called to the ordering clinician or representative by the Radiologist Assistant, and communication documented in the PACS or zVision Dashboard. Electronically Signed   By: Marijo Conception, M.D.   On: 01/06/2018 12:17   Ct Chest Wo Contrast  Result Date: 01/06/2018 CLINICAL DATA:  Evaluate pneumothorax. EXAM: CT CHEST WITHOUT CONTRAST TECHNIQUE: Multidetector CT imaging of the chest was performed following the standard protocol without IV contrast. COMPARISON:  Multiple recent chest x-rays and a prior chest CT from 10/03/2017 FINDINGS: Cardiovascular: The heart is normal in size. No pericardial effusion. There is mild tortuosity, ectasia and calcification of the thoracic aorta. Coronary artery calcifications are noted. Mediastinum/Nodes: The endotracheal tube is in good position at the mid tracheal level. There is also an NG tube coursing down the esophagus and into the stomach. Extensive pneumomediastinum with scattered mesenteric lymph nodes. Lungs/Pleura: Severe underlying lung disease with pulmonary fibrosis. There are bilateral pneumothoraces. The left pneumothorax estimated at 50%. The right pneumothorax is 15%. Extensive subcutaneous emphysema is again noted. Upper Abdomen: Is a contour abnormality involving segment 6 of the liver and the suspicion for a rounded mass.  However, I think this is stable when compared to the prior CT from 2015 and is likely benign. Small gallstones are noted the gallbladder. Musculoskeletal: No significant bony findings. IMPRESSION: 1. Bilateral pneumothoraces. The left-sided pneumothorax is estimated at 50%  and is much larger when compared to recent x-rays. The right-sided pneumothorax is estimated at 15%. 2. Persistent severe pneumomediastinum and subcutaneous emphysema. 3. Severe underlying lung disease/pulmonary fibrosis with superimposed diffuse pulmonary edema. These results were called by telephone at the time of interpretation on 01/06/2018 at 12:35 pm to ICU nurse, Parmele , who verbally acknowledged these results. Aortic Atherosclerosis (ICD10-I70.0) and Emphysema (ICD10-J43.9). Electronically Signed   By: Marijo Sanes M.D.   On: 01/06/2018 12:35   Mr Brain Wo Contrast  Result Date: 01/06/2018 CLINICAL DATA:  85 y/o  M; code stroke. EXAM: MRI HEAD WITHOUT CONTRAST TECHNIQUE: Multiplanar, multiecho pulse sequences of the brain and surrounding structures were obtained without intravenous contrast. COMPARISON:  01/06/2018 CT head FINDINGS: Brain: Large left PCA distribution acute/early subacute infarction with reduced diffusion involving left thalamus, left occipital lobe, and left medial temporal lobe. Additional scattered small foci of reduced diffusion throughout the frontal parietal lobes in a watershed distribution, punctate foci in right caudate head and right thalamus, as well as multiple small foci within the mid to inferior cerebellar hemispheres. No associated hemorrhage. Infarcts demonstrate T2 FLAIR hyperintense signal abnormality and mild local mass effect. No extra-axial collection, hydrocephalus, or herniation. Vascular: Normal flow voids. Skull and upper cervical spine: Normal marrow signal. Sinuses/Orbits: Partial opacification of the mastoid air cells. No abnormal signal of the paranasal sinuses. Bilateral intra-ocular  lens replacement. Other: None. IMPRESSION: Left PCA distribution large late acute/early subacute infarction. Additional scattered small bilateral watershed distribution infarcts as well as infarcts in the bilateral cerebellar hemispheres. No associated hemorrhage. These results will be called to the ordering clinician or representative by the Radiologist Assistant, and communication documented in the PACS or zVision Dashboard. Electronically Signed   By: Kristine Garbe M.D.   On: 01/06/2018 19:28   Dg Chest Port 1 View  Result Date: 01/07/2018 CLINICAL DATA:  Pneumothorax. EXAM: PORTABLE CHEST 1 VIEW COMPARISON:  01/07/2018. FINDINGS: Endotracheal tube, NG tube, left chest tube in stable position. Cardiomegaly. Diffuse bilateral pulmonary infiltrates again noted. No prominent pleural effusion. Small right pneumothorax again noted. Similar findings noted from prior exam. Diffuse chest wall subcutaneous emphysema. IMPRESSION: 1. Endotracheal tube and, NG tube, left chest tube in stable position. 2. Right pneumothorax unchanged. Diffuse chest wall subcutaneous emphysema again noted. 3.  Diffuse bilateral pulmonary infiltrates unchanged. Electronically Signed   By: Marcello Moores  Register   On: 01/07/2018 13:47   Dg Chest Port 1 View  Result Date: 01/07/2018 CLINICAL DATA:  Respiratory failure EXAM: PORTABLE CHEST 1 VIEW COMPARISON:  01/06/2018 FINDINGS: Cardiac shadow is enlarged but stable. Endotracheal tube and nasogastric catheter are noted in satisfactory position. Pigtail catheter is noted on the left with resolution of the previously seen tiny pneumothorax. The small right pneumothorax remains with slight increase in the apex with approximately 2.7 cm excursion. Subcutaneous emphysema is noted. Generalized parenchymal opacities are seen and stable. IMPRESSION: Stable airspace opacities bilaterally. Slight increase in right-sided pneumothorax when compared with the prior exam. A portion of this may be  positional in nature. Electronically Signed   By: Inez Catalina M.D.   On: 01/07/2018 07:09   Dg Chest Port 1 View  Result Date: 01/06/2018 CLINICAL DATA:  Respiratory failure EXAM: PORTABLE CHEST 1 VIEW COMPARISON:  01/05/2018 FINDINGS: Right pneumothorax again noted, stable. Endotracheal tube and NG tube are unchanged. Subcutaneous emphysema in the right neck and left chest are unchanged. Cardiomegaly with diffuse bilateral airspace disease, unchanged. IMPRESSION: Unchanged right pneumothorax and subcutaneous emphysema. Stable diffuse bilateral  airspace disease. Electronically Signed   By: Rolm Baptise M.D.   On: 01/06/2018 07:11   Dg Chest Port 1 View  Result Date: 01/05/2018 CLINICAL DATA:  Follow-up pneumothorax. EXAM: PORTABLE CHEST 1 VIEW COMPARISON:  Chest x-ray from same day at 49 19. FINDINGS: Unchanged endotracheal and enteric tubes. Unchanged right apical and lateral pneumothorax. Unchanged pneumomediastinum and bilateral neck subcutaneous emphysema. Coarse bilateral interstitial and alveolar opacities are similar to prior study. Stable cardiomediastinal silhouette. No large pleural effusion. No acute osseous abnormality. IMPRESSION: 1. Unchanged right pneumothorax and pneumomediastinum. 2. Unchanged diffuse bilateral lung opacities which may reflect chronic interstitial lung disease, pulmonary edema, or infection/inflammation. Electronically Signed   By: Titus Dubin M.D.   On: 01/05/2018 20:22   Dg Chest Port 1 View  Result Date: 01/05/2018 CLINICAL DATA:  F/u pneumothorax EXAM: PORTABLE CHEST - 1 VIEW COMPARISON:  Earlier film of the same day FINDINGS: Stable right lateral and apical pneumothorax, lung apex at the level of the posterior aspect right third rib. Persistent pneumomediastinum in bilateral neck subcutaneous emphysema. Endotracheal tube and nasogastric tube stable. Partial improvement in the bilateral coarse interstitial and alveolar opacities, involving bases more than  apices. Heart size upper limits normal. No definite effusion. Visualized bones unremarkable. IMPRESSION: 1. Stable right pneumothorax and pneumomediastinum. 2. Partial improvement in bilateral infiltrates or edema. 3. Support hardware stable in position. Electronically Signed   By: Lucrezia Europe M.D.   On: 01/05/2018 14:58   Dg Chest Port 1 View  Addendum Date: 01/05/2018   ADDENDUM REPORT: 01/05/2018 12:28 ADDENDUM: Findings discussed with the patient's Nurse Practitioner, Noe Gens. Electronically Signed   By: Dorise Bullion III M.D   On: 01/05/2018 12:28   Result Date: 01/05/2018 CLINICAL DATA:  Evaluate subcutaneous air. EXAM: PORTABLE CHEST 1 VIEW COMPARISON:  January 03, 2017 FINDINGS: The ETT is in good position. The enteric tube terminates below today's film. There is a new right-sided pneumothorax measuring 2 cm laterally and 2.7 cm at the apex, small to moderate in size. Diffuse interstitial opacities remain in the lungs, similar. There is new air in the subcutaneous tissues of the bilateral chest wall and base of neck. Suspected mediastinal air in the left superior mediastinum. No change in the cardiomediastinal silhouette. IMPRESSION: 1. New pneumothorax on the right, small to moderate sized. 2. Suggested mediastinal air in the superior mediastinum to the left. 3. Significant subcutaneous air in the bases of neck bilaterally and in the bilateral chest wall. Findings being called to the referring clinical team. Electronically Signed: By: Dorise Bullion III M.D On: 01/05/2018 12:22   Dg Chest Port 1 View  Result Date: 01/03/2018 CLINICAL DATA:  Respiratory failure. EXAM: PORTABLE CHEST 1 VIEW COMPARISON:  Radiograph yesterday. FINDINGS: Endotracheal tube tip 5.3 cm from the carina. Tip and side port of the enteric tube below the diaphragm in the stomach. Cardiomegaly is unchanged. No significant change in diffuse reticular opacities throughout both lungs. No large pleural effusion. No  pneumothorax. IMPRESSION: Unchanged diffuse bilateral lung opacities which may represent chronic interstitial lung disease, superimposed edema, infectious or inflammatory etiologies or combination thereof. Electronically Signed   By: Keith Rake M.D.   On: 01/03/2018 05:48   Dg Chest Port 1 View  Result Date: 01/02/2018 CLINICAL DATA:  Status post endotracheal tube placement. EXAM: PORTABLE CHEST 1 VIEW COMPARISON:  Radiograph January 02, 2018. FINDINGS: Stable cardiomegaly. Endotracheal tube is seen projected over tracheal air shadow with distal tip 5 cm above the carina. Nasogastric tube  tip is seen in proximal stomach. No pneumothorax is noted. Diffuse reticular densities are noted throughout both lungs which may represent chronic interstitial lung disease, but acute superimposed edema or inflammation could not be excluded. No significant pleural effusion is noted. Bony thorax is unremarkable. IMPRESSION: Stable bilateral lung opacities as described above. Endotracheal and nasogastric tubes are in grossly good position. Electronically Signed   By: Marijo Conception, M.D.   On: 01/02/2018 10:52   Dg Chest Port 1 View  Result Date: 01/02/2018 CLINICAL DATA:  Acute respiratory failure with hypoxia. EXAM: PORTABLE CHEST 1 VIEW COMPARISON:  Radiograph December 31, 2017. FINDINGS: Stable cardiomegaly. No pneumothorax is noted. No significant pleural effusion is noted. Stable diffuse reticular densities are noted throughout both lungs which may represent chronic interstitial lung disease or fibrosis, but acute superimposed inflammation or edema could not be excluded. Bony thorax is unremarkable. IMPRESSION: Stable diffuse reticular densities are noted bilaterally which may represent chronic interstitial lung disease or pulmonary fibrosis, but acute superimposed edema or inflammation can not be excluded. Electronically Signed   By: Marijo Conception, M.D.   On: 01/02/2018 07:57   Dg Chest Port 1  View  Result Date: 12/31/2017 CLINICAL DATA:  Shortness of breath. History of interstitial lung disease EXAM: PORTABLE CHEST 1 VIEW COMPARISON:  December 29, 2017 chest radiograph and chest CT October 03, 2017 FINDINGS: There is widespread fibrosis bilaterally. Currently, there is no appreciable superimposed airspace consolidation. No overt interstitial edema is evident. Heart is mildly enlarged with pulmonary venous hypertension. No adenopathy. No bone lesions. IMPRESSION: Extensive interstitial fibrosis bilaterally. No consolidation. No overt interstitial edema is evident. Interstitial edema superimposed on this degree of fibrosis may be subtle and difficult to discern. There is underlying pulmonary vascular congestion. Electronically Signed   By: Lowella Grip III M.D.   On: 12/31/2017 09:56   Dg Abd Portable 1v  Result Date: 01/02/2018 CLINICAL DATA:  Status post nasogastric tube placement. EXAM: PORTABLE ABDOMEN - 1 VIEW COMPARISON:  None. FINDINGS: The bowel gas pattern is normal. Distal tip of nasogastric tube is seen in expected position of proximal stomach. No radio-opaque calculi or other significant radiographic abnormality are seen. IMPRESSION: No evidence of bowel obstruction or ileus. Distal tip of nasogastric tube seen in expected position of proximal stomach. Electronically Signed   By: Marijo Conception, M.D.   On: 01/02/2018 10:53   Vas US Carotid  Result Date: 01/07/2018 Carotid Arterial Duplex Study Indications: CVA. Limitations: Patient postitioning and difficultly visualizing anatomy. Performing Technologist: Abram Sander RVS  Examination Guidelines: A complete evaluation includes B-mode imaging, spectral Doppler, color Doppler, and power Doppler as needed of all accessible portions of each vessel. Bilateral testing is considered an integral part of a complete examination. Limited examinations for reoccurring indications may be performed as noted.  Right Carotid Findings:  +----------+--------+--------+--------+------------+--------------+           PSV cm/sEDV cm/sStenosisDescribe    Comments       +----------+--------+--------+--------+------------+--------------+ CCA Prox  256     29              heterogenous               +----------+--------+--------+--------+------------+--------------+ CCA Distal213     26              heterogenous               +----------+--------+--------+--------+------------+--------------+ ICA Prox  147     42  40-59%  heterogenous               +----------+--------+--------+--------+------------+--------------+ ICA Distal168     52                                         +----------+--------+--------+--------+------------+--------------+ ECA                                           Not visualized +----------+--------+--------+--------+------------+--------------+ +----------+--------+-------+--------------+-------------------+           PSV cm/sEDV cmsDescribe      Arm Pressure (mmHG) +----------+--------+-------+--------------+-------------------+ Subclavian               not visualized                    +----------+--------+-------+--------------+-------------------+ +---------+--------+--------+--------------+ VertebralPSV cm/sEDV cm/snot visualized +---------+--------+--------+--------------+  Left Carotid Findings: +----------+--------+--------+--------+------------+--------------+           PSV cm/sEDV cm/sStenosisDescribe    Comments       +----------+--------+--------+--------+------------+--------------+ CCA Prox  184                     heterogenous               +----------+--------+--------+--------+------------+--------------+ CCA Distal150                     heterogenous               +----------+--------+--------+--------+------------+--------------+ ICA Prox  156     36      1-39%   heterogenous                +----------+--------+--------+--------+------------+--------------+ ICA Distal                                    Not visualized +----------+--------+--------+--------+------------+--------------+ ECA       166                                                +----------+--------+--------+--------+------------+--------------+ +----------+--------+--------+--------------+-------------------+ SubclavianPSV cm/sEDV cm/sDescribe      Arm Pressure (mmHG) +----------+--------+--------+--------------+-------------------+                           not visualized                    +----------+--------+--------+--------------+-------------------+ +---------+--------+---+--------+ VertebralPSV cm/s277EDV cm/s +---------+--------+---+--------+  Summary: Right Carotid: Velocities in the right ICA are consistent with a 40-59%                stenosis. Left Carotid: Velocities in the left ICA are consistent with a 1-39% stenosis. Vertebrals: Bilateral vertebral arteries demonstrate antegrade flow. *See table(s) above for measurements and observations.  Electronically signed by Harold Barban MD on 01/07/2018 at 7:52:30 AM.    Final    Vas [place] Lower Extremity Venous (dvt)  Result Date: 01/07/2018  Lower Venous Study Indications: Swelling, and History of left popliteal vein DVT 09/10/17.  Performing Technologist: Maudry Mayhew MHA, RDMS, RVT, RDCS  Examination Guidelines: A complete evaluation includes B-mode imaging, spectral  Doppler, color Doppler, and power Doppler as needed of all accessible portions of each vessel. Bilateral testing is considered an integral part of a complete examination. Limited examinations for reoccurring indications may be performed as noted.  Right Venous Findings: +---------+---------------+---------+-----------+----------+--------------+          CompressibilityPhasicitySpontaneityPropertiesSummary         +---------+---------------+---------+-----------+----------+--------------+ CFV      Full           Yes      Yes                                 +---------+---------------+---------+-----------+----------+--------------+ SFJ      Full                                                        +---------+---------------+---------+-----------+----------+--------------+ FV Prox  Full                                                        +---------+---------------+---------+-----------+----------+--------------+ FV Mid   Full                                                        +---------+---------------+---------+-----------+----------+--------------+ FV DistalFull                                                        +---------+---------------+---------+-----------+----------+--------------+ PFV      Full                                                        +---------+---------------+---------+-----------+----------+--------------+ POP      Full           Yes      Yes                                 +---------+---------------+---------+-----------+----------+--------------+ PTV      None                    No                   Acute          +---------+---------------+---------+-----------+----------+--------------+ PERO                                                  Not visualized +---------+---------------+---------+-----------+----------+--------------+ SSV      None  Acute          +---------+---------------+---------+-----------+----------+--------------+  Left Venous Findings: +-----------------+---------------+---------+-----------+--------------+-------+                  CompressibilityPhasicitySpontaneityProperties    Summary +-----------------+---------------+---------+-----------+--------------+-------+ CFV              Full           Yes      Yes                               +-----------------+---------------+---------+-----------+--------------+-------+ SFJ              Full                                                     +-----------------+---------------+---------+-----------+--------------+-------+ FV Prox          Full                                                     +-----------------+---------------+---------+-----------+--------------+-------+ FV Mid           Full                                                     +-----------------+---------------+---------+-----------+--------------+-------+ FV Distal        Full                                                     +-----------------+---------------+---------+-----------+--------------+-------+ PFV              Full                                                     +-----------------+---------------+---------+-----------+--------------+-------+ POP              Full                                                     +-----------------+---------------+---------+-----------+--------------+-------+ PTV              None           Yes      Yes        softly        Acute  echogenic             +-----------------+---------------+---------+-----------+--------------+-------+ Tibioperoneal    Partial        Yes                               Acute   trunk                                                                     +-----------------+---------------+---------+-----------+--------------+-------+    Summary: Right: Findings consistent with acute deep vein thrombosis involving the right posterior tibial vein. Findings consistent with acute superficial vein thrombosis involving the right small saphenous vein. No cystic structure found in the popliteal fossa. Left: Findings consistent with acute deep vein thrombosis involving the left tibioperoneal trunk and posterior tibial veins. No cystic structure  found in the popliteal fossa.  *See table(s) above for measurements and observations. Electronically signed by Ruta Hinds MD on 01/07/2018 at 1:48:08 PM.    Final     Microbiology No results found for this or any previous visit (from the past 240 hour(s)).  Lab Basic Metabolic Panel: Recent Labs  Lab 01/06/18 0253 01/06/18 1104 01/07/18 0310 01/07/18 1121  NA 155* 152* 158* 156*  K 5.3* 5.1 5.4* 5.1  CL 119* 120* 122* 122*  CO2 _0 GLUCOSE 189* 240* 177* 216*  BUN 73* 74* 72* 68*  CREATININE 1.66* 1.66* 1.60* 1.43*  CALCIUM 8.5* 8.3* 8.5* 8.3*   Liver Function Tests: Recent Labs  Lab 01/06/18 0253  AST 32  ALT 42  ALKPHOS 57  BILITOT 0.7  PROT 6.2*  ALBUMIN 2.5*   No results for input(s): LIPASE, AMYLASE in the last 168 hours. No results for input(s): AMMONIA in the last 168 hours. CBC: Recent Labs  Lab 01/06/18 0253 01/07/18 0310 2018-01-11 0244  WBC 19.3* 16.6* 12.2*  NEUTROABS 17.9* 15.6* 11.5*  HGB 11.2* 11.2* 10.0*  HCT 37.8* 37.8* 34.0*  MCV 102.4* 100.5* 100.3*  PLT 88* 80* 73*   Cardiac Enzymes: No results for input(s): CKTOTAL, CKMB, CKMBINDEX, TROPONINI in the last 168 hours. Sepsis Labs: Recent Labs  Lab 01/06/18 0253 01/07/18 0310 January 11, 2018 0244  WBC 19.3* 16.6* 12.2*    Procedures/Operations  Intubation-12/14 Bronchoscopy-12/14 Chest tube placement-12/18   Quaniya Damas A Yarden Manuelito 01/12/2018, 10:19 AM

## 2018-01-20 NOTE — Progress Notes (Signed)
PULMONARY / CRITICAL CARE MEDICINE   NAME:  Justin Hester, MRN:  [id number], DOB:  March 19, 1930, LOS: 57 ADMISSION DATE:  12/21/2017, CONSULTATION DATE:  12/30/2017 REFERRING MD:  Justin Hester, CHIEF COMPLAINT:  Dyspnea  BRIEF HISTORY:    85 year old male with obstructive sleep apnea and ILD admitted on December 29, 2017 with shortness of breath and acute on chronic respiratory failure with hypoxemia due to evidence of community-acquired pneumonia. Acute exacerbation of his interstitial lung disease  SIGNIFICANT PAST MEDICAL HISTORY   Obstructive sleep apnea, pulmonary nodules, recurrent DVT, gastroesophageal reflux disease, diabetes mellitus.  SIGNIFICANT EVENTS:  12/10 admission 12/11 move to ICU for close observation 12/13 BIPAP 12/14 Intubated, BAL 12/15 twitching, EEG pending. Vent dyssynchrony noted 12/16 Questionable seizure, EEG neg 12/17 Concern for SQ air on left>right lateral neck 12/19 status post left chest tube placement 12/20 Withdrawal of care  STUDIES:   October 03, 2017 CT angiogram chest showed peripheral interlobular septal thickening, images somewhat blurry but there does appear to be peripheral honeycombing worse in the bases, heterogeneous changes with areas of lung spared, images independently reviewed no pulmonary embolism.  Radiology notes the findings are probable UIP October 30 blood work ANA negative, rheumatoid factor negative November 2019 lung function testing showed total lung capacity of 4.18 L 57% predicted DLCO 14.4 mL 42% predicted December 29, 2017 chest x-ray images independently reviewed showing bilateral airspace disease, some pleural effusion question cephalization December 11/2017 Labs: ANCA negative, Jo-1 negative, SSA/SSB negative, SCL-70 negative CT scan of the head on 01/06/2018 noted MRI of the head 01/06/2018 noted for left occipital stroke, watershed stroke  CULTURES:  12/10 BCx2 >> negative 12/11 RSV > negative 12/11 Flu >  negative 12/10 urine strep > negative 12/11 urine legionella > negative  12/14 BAL Bact > negative, normal flora  12/14 BAL Fungus > no fungus no smear >> 12/14 BAL AFB >> negative smear >>  ANTIBIOTICS:  12/10 ceftriaxone >> 12/17  12/10 azithro >> 12/17 12/10 tamiflu x1 dose  LINES/TUBES:  ETT 12/14 >>  Left-sided chest tube 01/06/2018  CONSULTANTS:  Pulmonary medicine Neurology-for his CVA Nephrology-for his hypernatremia  SUBJECTIVE:  RN reports no acute events.  FiO2 55%/PEEP 10.  Remains on fentanyl gtt, Precedex, Ativan pushes as needed  CONSTITUTIONAL: BP (!) 130/55   Pulse 62   Temp (!) 96.5 F (35.8 C) (Axillary)   Resp 14   Ht 5' 11" (1.803 m)   Wt 98.4 kg   SpO2 95%   BMI 30.26 kg/m   I/O last 3 completed shifts: In: 5888.5 [I.V.:4814.2; NG/GT:972.7; IV Piggyback:101.6] Out: 8182 [Urine:4525; Chest Tube:20]  PHYSICAL EXAM: General: Does not appear to be in distress   RESOLVED PROBLEM LIST   ASSESSMENT AND PLAN    Withdrawal of care Based on patients wishes of not being kept alive by artificial measures and his limited prognosis of recovery and possible liberation from the ventilator Care is being withdrawn and will make him comfort measures Orders placed Discussed with family extensively Questions answered.  Acute respiratory failure with hypoxemia in setting of multifocal infiltrates  Exacerbation of pulmonary fibrosis  Severe pulmonary hypertension  Subcutaneous Air around neck on exam, pneumothorax  Left occipital CVA  Nose bleed vs Pharyngeal bleeding after NG placement  Hx DVT, new DVTs  Diffuse Bilateral Infiltrates   Hypertension  Hypothyroid  Acute encephalopathy/ICU delirium  Mild normocytic anemia without bleeding   Best Practice / Goals of Care / Disposition.    Comfort measures only  LABS  [facility] Pgr: [phone] or if no answer [phone] Jan 15, 2018, 7:22  AM

## 2018-01-20 NOTE — Progress Notes (Addendum)
Extubated pt to 2l nasal cannula per md order using Withdrawal of life support protocol.

## 2018-01-20 NOTE — Progress Notes (Signed)
Medication wasted with Jonni Sanger. 10 mL of versed, 51m of morphine, and 584mof fentanyl wasted in  sink.

## 2018-01-20 DEATH — deceased

## 2018-02-02 LAB — FUNGAL ORGANISM REFLEX

## 2018-02-02 LAB — FUNGUS CULTURE WITH STAIN

## 2018-02-02 LAB — FUNGUS CULTURE RESULT

## 2018-02-15 LAB — ACID FAST CULTURE WITH REFLEXED SENSITIVITIES (MYCOBACTERIA): Acid Fast Culture: NEGATIVE

## 2018-06-02 ENCOUNTER — Telehealth: Payer: Self-pay | Admitting: Pulmonary Disease

## 2018-06-02 NOTE — Telephone Encounter (Signed)
Spoke with pt's wife, she would like to talk to Dr. Lake Bells. She said that his symptoms are identical to the Covid-19. She states Dr. Lake Bells did all the treatments he could but he wasn't recovering very well. Could he have been exposed to Covid-19? Her husband has been cremated, do they still have blood samples? Is there anything they can do to figure out if he had Covid-19. They are just really questioning this and wants Dr. Lake Bells (only BQ) to call her when he gets a minute. I have informaed her about the doctors schedule and she understood stating that this wasn't an emergency. Dr. Lake Bells please call when you have a chance. Thanks.

## 2018-06-02 NOTE — Telephone Encounter (Signed)
Please route to NP I am exceedingly busy with [facility] work

## 2018-06-03 NOTE — Telephone Encounter (Signed)
Left message for patient's wife to call back.

## 2018-06-07 NOTE — Telephone Encounter (Signed)
Called and spoke with patient's wife [name]. She is not wanting to speak with anyone other then BQ She advised for me to close the encounter at this time due to the COVID-19 And she understands that BQ is working at the hospital with COVID-19 pt's And doesn't want to be a bother, her concerns and questions can wait She advised that she will call back in end of Aug 2020 to speak with BQ directly Closing encounter today per pt's wife request Nothing further needed at this time.

## 2018-07-22 ENCOUNTER — Ambulatory Visit: Payer: Medicare Other

## 2018-07-27 ENCOUNTER — Encounter: Payer: Medicare Other | Admitting: Family Medicine

## 2020-03-31 IMAGING — DX DG ANKLE COMPLETE 3+V*L*
3 series · 3 of 3 positions shown · non-contrast
Comparison: None.

CLINICAL DATA: Left ankle swelling

EXAM:
LEFT ANKLE COMPLETE - 3+ VIEW

[ankle ap]
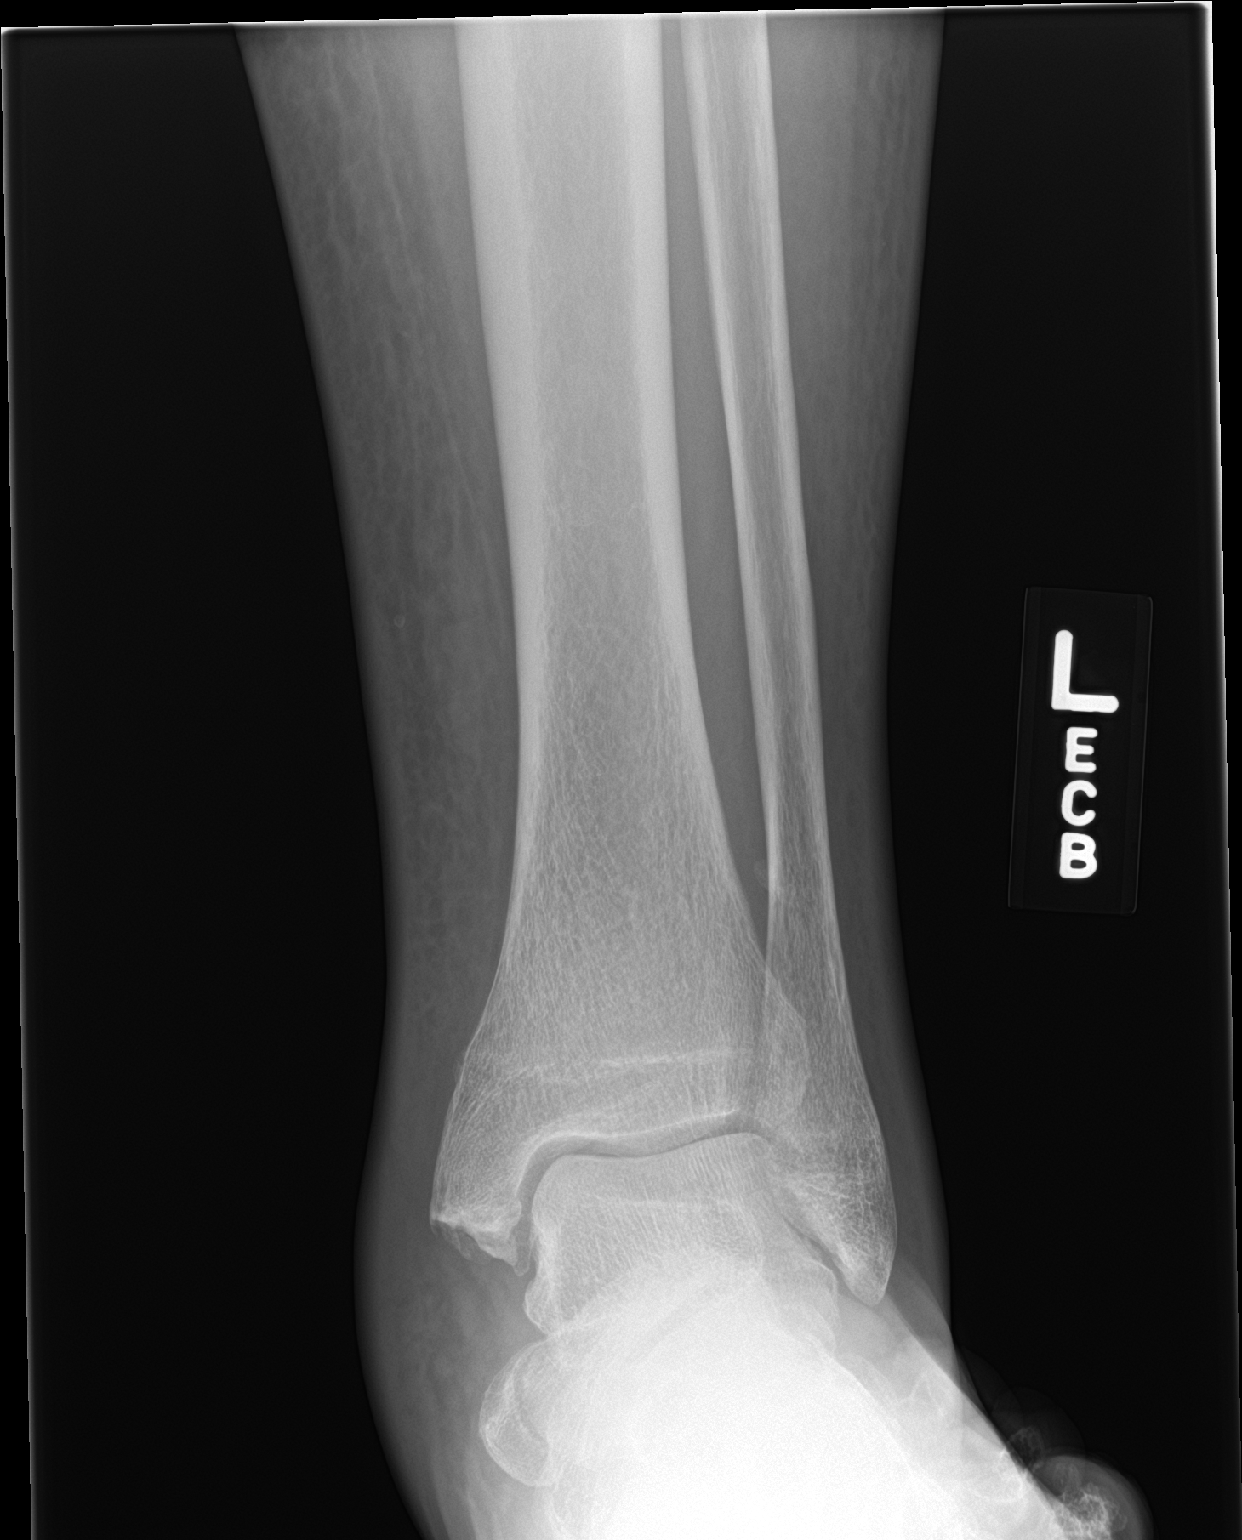

[ankle obl]
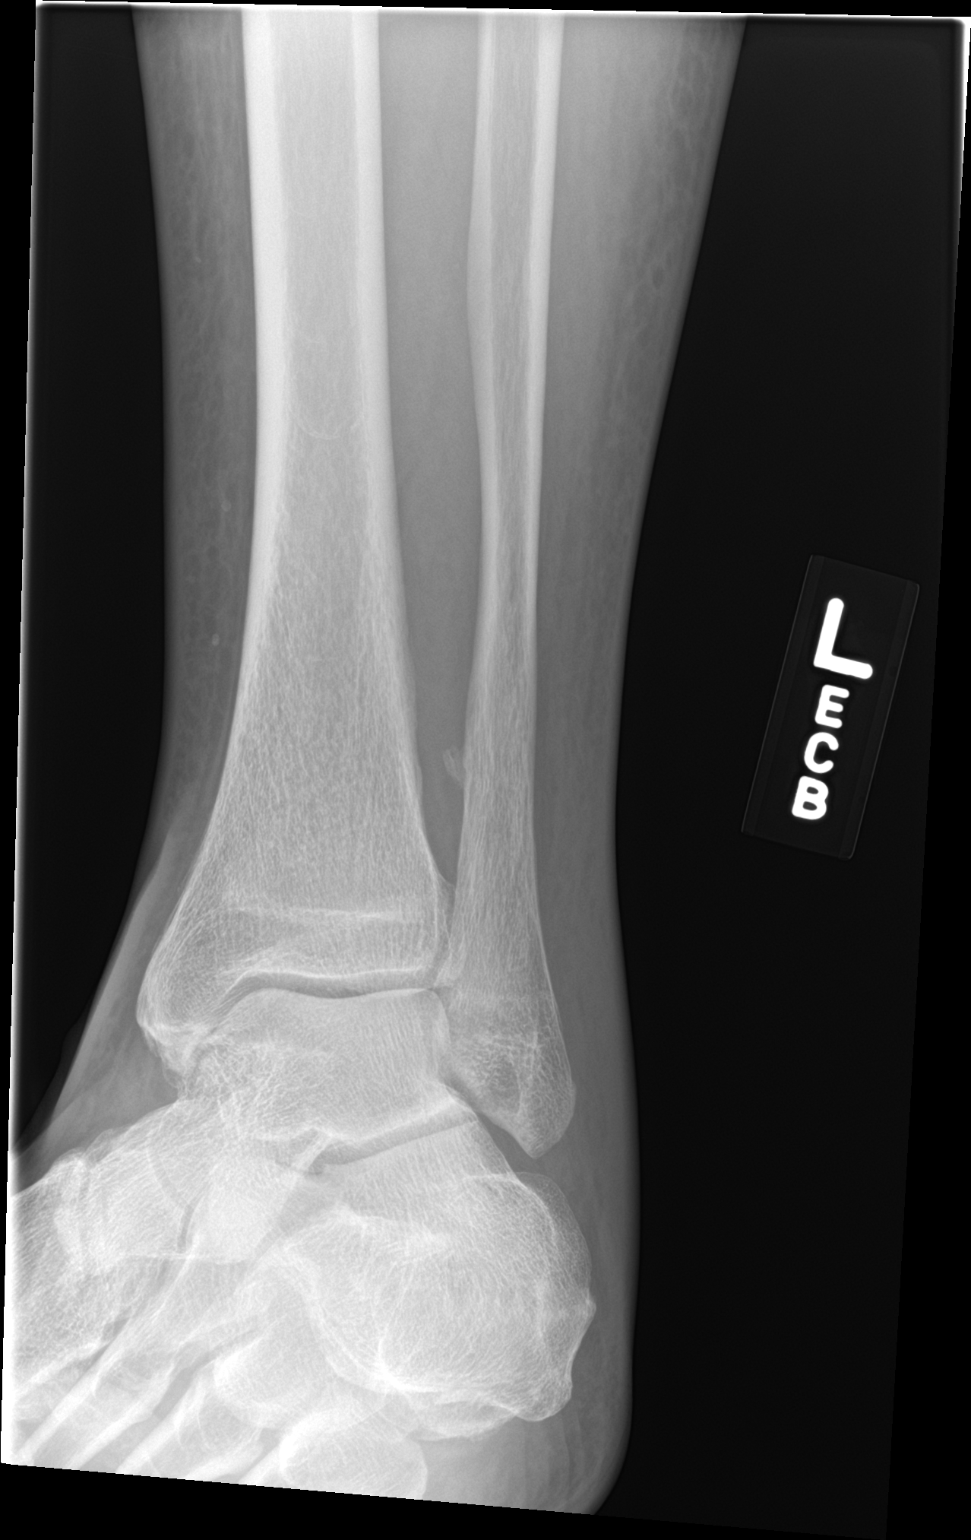

[ankle lat]
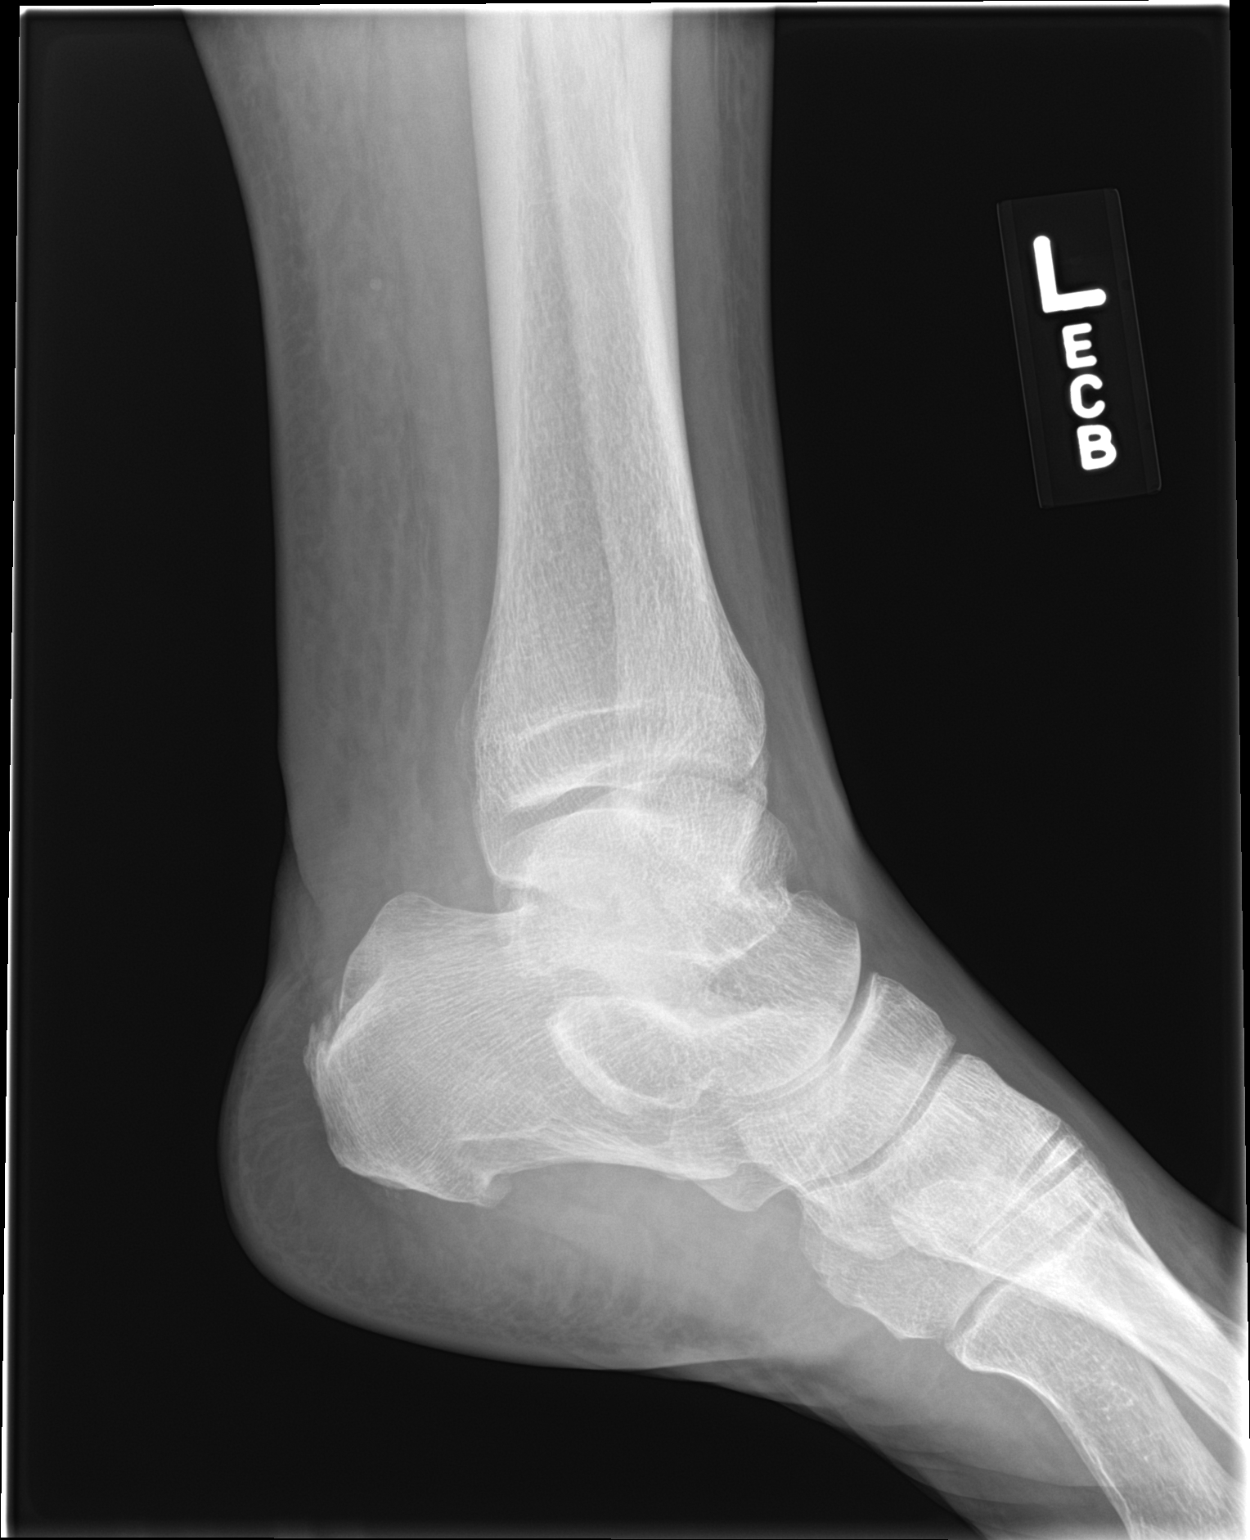

[3 of 3 positions shown; findings below may reference images not displayed]

FINDINGS: No acute displaced fracture or malalignment. Degenerative changes
medially. Diffuse soft tissue swelling. Ankle mortise is symmetric.
IMPRESSION: Soft tissue swelling.  No acute osseous abnormality.
